# Patient Record
Sex: Female | Born: 1975 | Race: Black or African American | Hispanic: No | Marital: Single | Smoking: Never smoker
Health system: Southern US, Community
[De-identification: ages and names within clinical notes are randomized; demographics above are authoritative.]

## PROBLEM LIST (undated history)

## (undated) DIAGNOSIS — N200 Calculus of kidney: Secondary | ICD-10-CM

## (undated) DIAGNOSIS — K219 Gastro-esophageal reflux disease without esophagitis: Secondary | ICD-10-CM

## (undated) DIAGNOSIS — K589 Irritable bowel syndrome without diarrhea: Secondary | ICD-10-CM

## (undated) DIAGNOSIS — I10 Essential (primary) hypertension: Secondary | ICD-10-CM

## (undated) HISTORY — DX: Essential (primary) hypertension: I10

## (undated) HISTORY — DX: Irritable bowel syndrome, unspecified: K58.9

## (undated) HISTORY — DX: Gastro-esophageal reflux disease without esophagitis: K21.9

## (undated) HISTORY — DX: Calculus of kidney: N20.0

---

## 2005-03-13 HISTORY — PX: TUBAL LIGATION: SHX77

## 2005-03-22 ENCOUNTER — Emergency Department: Payer: Self-pay | Admitting: Emergency Medicine

## 2005-06-18 ENCOUNTER — Emergency Department: Payer: Self-pay | Admitting: Emergency Medicine

## 2005-07-11 ENCOUNTER — Ambulatory Visit: Payer: Self-pay | Admitting: Family Medicine

## 2005-09-06 ENCOUNTER — Ambulatory Visit: Payer: Self-pay | Admitting: Family Medicine

## 2005-11-09 ENCOUNTER — Observation Stay: Payer: Self-pay

## 2005-11-10 ENCOUNTER — Observation Stay: Payer: Self-pay

## 2005-11-11 ENCOUNTER — Ambulatory Visit: Payer: Self-pay

## 2005-12-22 ENCOUNTER — Observation Stay: Payer: Self-pay | Admitting: Obstetrics and Gynecology

## 2005-12-30 ENCOUNTER — Observation Stay: Payer: Self-pay

## 2006-01-06 ENCOUNTER — Observation Stay: Payer: Self-pay | Admitting: Unknown Physician Specialty

## 2006-01-07 ENCOUNTER — Inpatient Hospital Stay: Payer: Self-pay | Admitting: Unknown Physician Specialty

## 2006-09-12 ENCOUNTER — Ambulatory Visit: Payer: Self-pay | Admitting: Family Medicine

## 2006-09-24 ENCOUNTER — Ambulatory Visit: Payer: Self-pay | Admitting: Family Medicine

## 2007-11-07 ENCOUNTER — Emergency Department: Payer: Self-pay | Admitting: Internal Medicine

## 2007-12-05 ENCOUNTER — Emergency Department: Payer: Self-pay | Admitting: Emergency Medicine

## 2007-12-25 ENCOUNTER — Ambulatory Visit: Payer: Self-pay

## 2008-03-13 HISTORY — PX: CRYOABLATION: SHX1415

## 2008-11-09 ENCOUNTER — Emergency Department: Payer: Self-pay | Admitting: Emergency Medicine

## 2009-03-01 ENCOUNTER — Ambulatory Visit: Payer: Self-pay | Admitting: Obstetrics & Gynecology

## 2009-03-11 ENCOUNTER — Ambulatory Visit: Payer: Self-pay | Admitting: Obstetrics & Gynecology

## 2009-11-24 ENCOUNTER — Ambulatory Visit: Payer: Self-pay | Admitting: Neurology

## 2010-01-28 ENCOUNTER — Ambulatory Visit: Payer: Self-pay | Admitting: Internal Medicine

## 2010-03-01 IMAGING — CR RIGHT ANKLE - COMPLETE 3+ VIEW
1 series · 5 of 5 positions shown · non-contrast
Comparison: none

REASON FOR EXAM: twisted ankle
COMMENTS:

PROCEDURE:     DXR - DXR ANKLE RIGHT COMPLETE  - December 05, 2007 [DATE]
RESULT:     No fracture, dislocation or other acute bony abnormality is
identified.  The ankle mortise is well maintained.

[Series 1: view not recorded · 0.17mm/px · 5 of 5 slices shown]
[im 1/5]
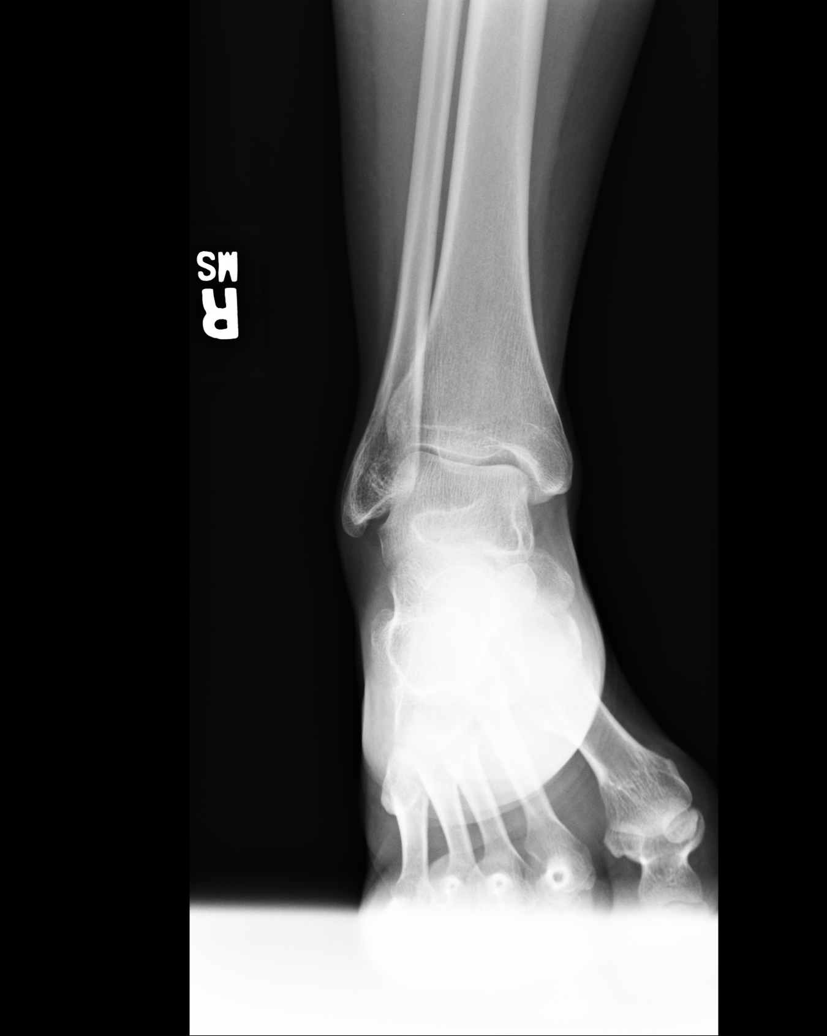
[im 2/5]
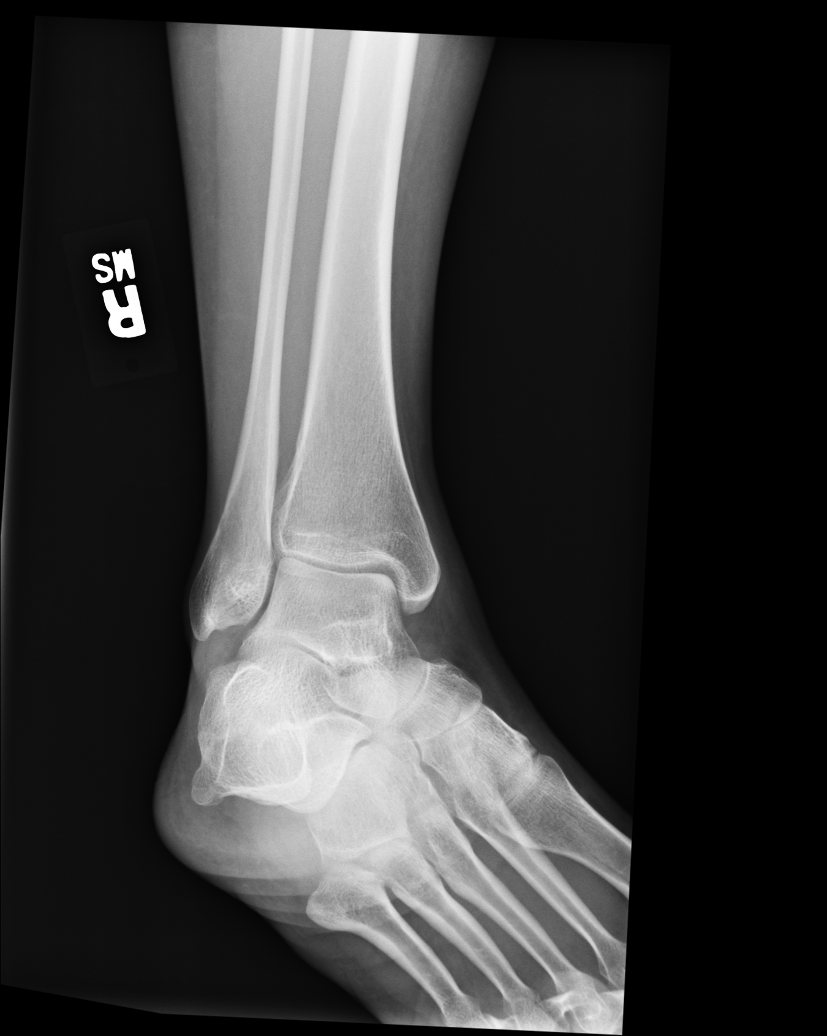
[im 3/5]
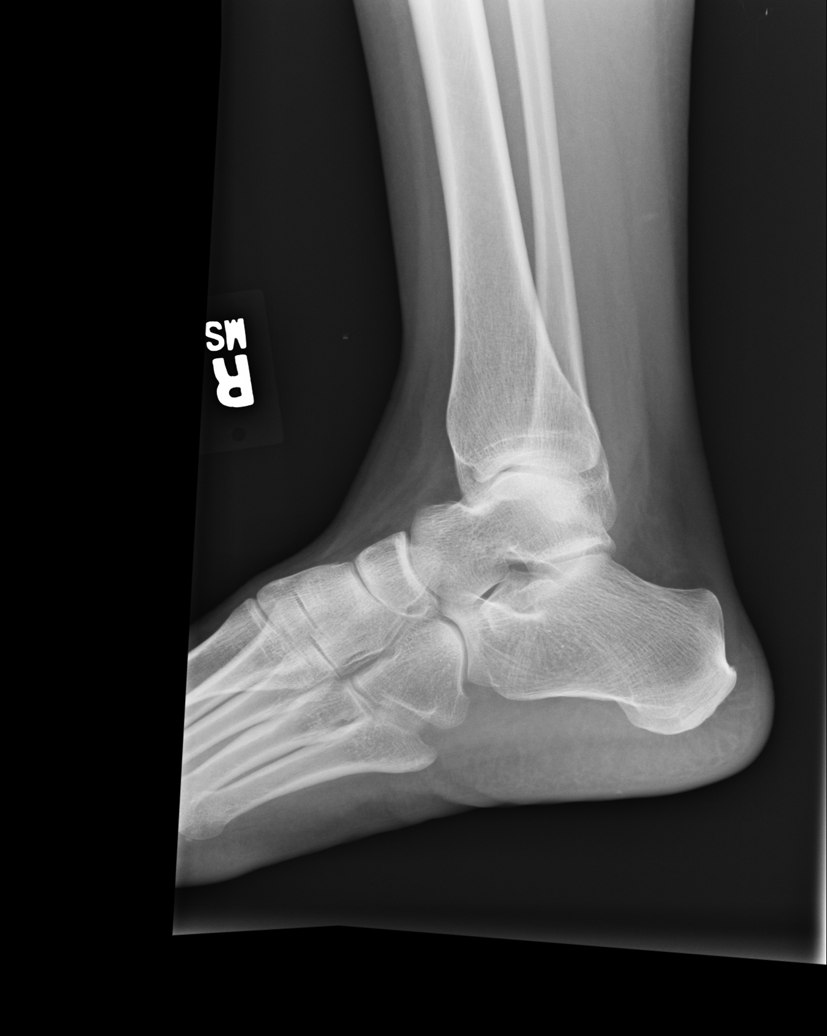
[im 4/5]
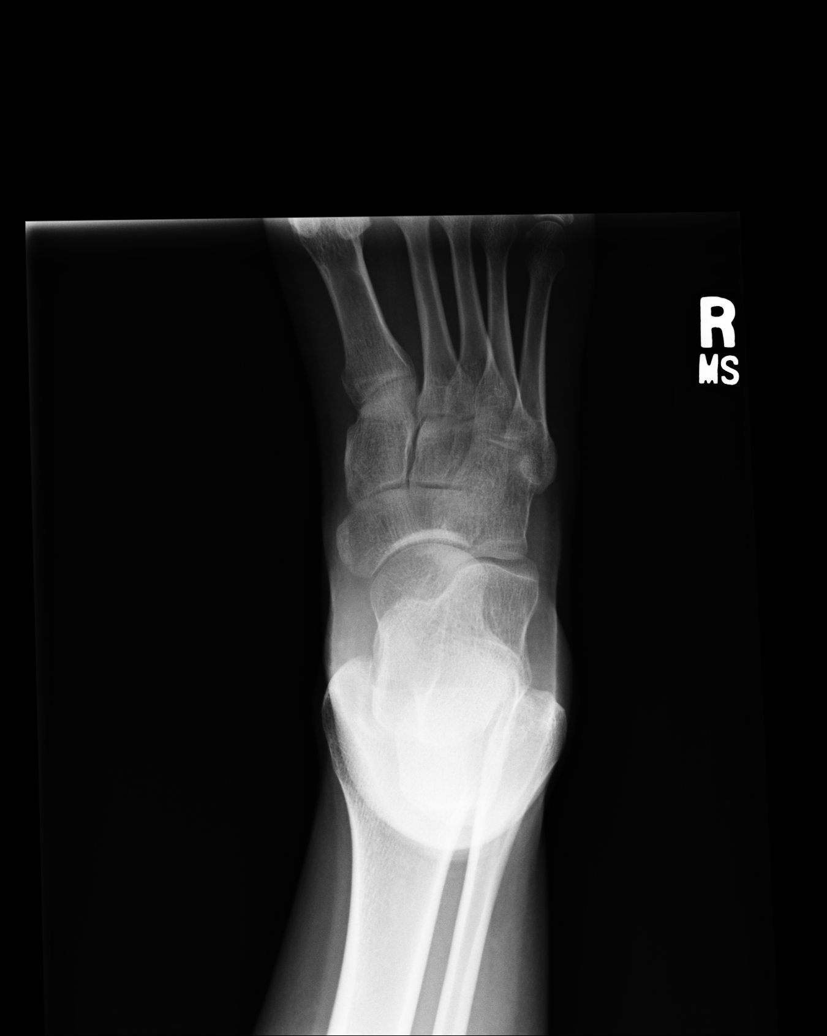
[im 5/5]
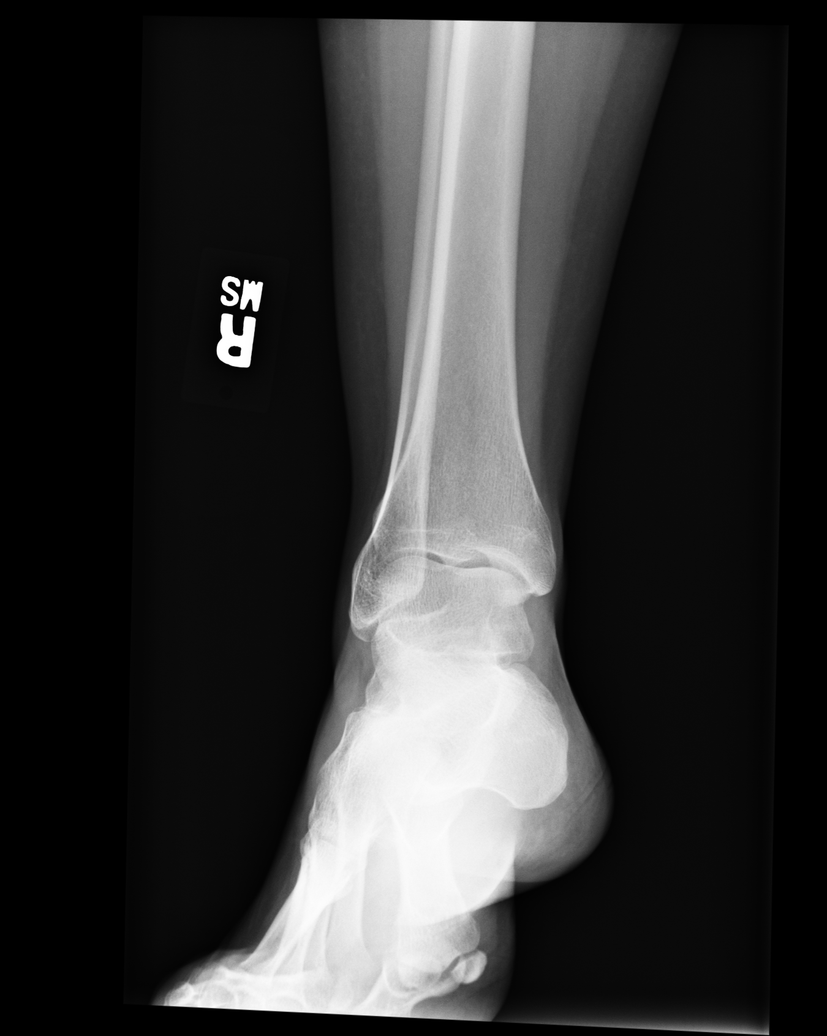

[5 of 5 positions shown; findings below may reference images not displayed]

IMPRESSION: No acute changes are identified.

## 2010-04-20 ENCOUNTER — Ambulatory Visit: Payer: Self-pay | Admitting: Internal Medicine

## 2010-05-12 ENCOUNTER — Ambulatory Visit: Payer: Self-pay | Admitting: Internal Medicine

## 2010-07-25 ENCOUNTER — Emergency Department: Payer: Self-pay | Admitting: Emergency Medicine

## 2010-08-12 ENCOUNTER — Ambulatory Visit: Payer: Self-pay | Admitting: Internal Medicine

## 2010-09-11 ENCOUNTER — Ambulatory Visit: Payer: Self-pay | Admitting: Internal Medicine

## 2011-03-07 ENCOUNTER — Emergency Department: Payer: Self-pay | Admitting: Emergency Medicine

## 2011-03-14 DIAGNOSIS — Z90710 Acquired absence of both cervix and uterus: Secondary | ICD-10-CM | POA: Insufficient documentation

## 2011-03-14 HISTORY — PX: VAGINAL HYSTERECTOMY: SUR661

## 2011-04-27 ENCOUNTER — Ambulatory Visit: Payer: Self-pay | Admitting: Internal Medicine

## 2011-07-17 ENCOUNTER — Ambulatory Visit: Payer: Self-pay | Admitting: Urology

## 2011-07-20 ENCOUNTER — Ambulatory Visit: Payer: Self-pay | Admitting: Urology

## 2011-11-12 ENCOUNTER — Emergency Department: Payer: Self-pay | Admitting: Emergency Medicine

## 2011-11-12 LAB — URINALYSIS, COMPLETE
Bilirubin,UR: NEGATIVE
Ketone: NEGATIVE
Nitrite: NEGATIVE
RBC,UR: 5 /HPF (ref 0–5)
Specific Gravity: 1.018 (ref 1.003–1.030)
Squamous Epithelial: 18
WBC UR: 8 /HPF (ref 0–5)

## 2011-11-12 LAB — PREGNANCY, URINE: Pregnancy Test, Urine: NEGATIVE m[IU]/mL

## 2011-11-13 ENCOUNTER — Emergency Department: Payer: Self-pay | Admitting: Emergency Medicine

## 2011-11-13 LAB — BASIC METABOLIC PANEL
Anion Gap: 4 — ABNORMAL LOW (ref 7–16)
Calcium, Total: 8.7 mg/dL (ref 8.5–10.1)
Chloride: 107 mmol/L (ref 98–107)
Co2: 28 mmol/L (ref 21–32)
Creatinine: 0.87 mg/dL (ref 0.60–1.30)
EGFR (African American): >60
EGFR (Non-African Amer.): >60
Glucose: 89 mg/dL (ref 65–99)
Osmolality: 275 (ref 275–301)

## 2011-11-13 LAB — CBC
HCT: 35.2 % (ref 35.0–47.0)
MCH: 24.1 pg — ABNORMAL LOW (ref 26.0–34.0)
MCHC: 32.2 g/dL (ref 32.0–36.0)
Platelet: 284 10*3/uL (ref 150–440)
RBC: 4.72 10*6/uL (ref 3.80–5.20)
RDW: 16.5 % — ABNORMAL HIGH (ref 11.5–14.5)

## 2011-11-13 LAB — URINALYSIS, COMPLETE
Bilirubin,UR: NEGATIVE
Ketone: NEGATIVE
Ph: 6 (ref 4.5–8.0)
Protein: NEGATIVE
Squamous Epithelial: 10

## 2011-12-06 ENCOUNTER — Ambulatory Visit: Payer: Self-pay | Admitting: Internal Medicine

## 2012-02-06 ENCOUNTER — Ambulatory Visit: Payer: Self-pay | Admitting: Obstetrics and Gynecology

## 2012-02-06 LAB — URINALYSIS, COMPLETE
Blood: NEGATIVE
Glucose,UR: NEGATIVE mg/dL (ref 0–75)
Nitrite: NEGATIVE
Ph: 7 (ref 4.5–8.0)
Protein: NEGATIVE
Specific Gravity: 1.02 (ref 1.003–1.030)

## 2012-02-12 ENCOUNTER — Ambulatory Visit: Payer: Self-pay | Admitting: Obstetrics and Gynecology

## 2012-02-13 LAB — PATHOLOGY REPORT

## 2012-02-13 LAB — HEMATOCRIT: HCT: 32.2 % — ABNORMAL LOW (ref 35.0–47.0)

## 2012-06-29 ENCOUNTER — Emergency Department: Payer: Self-pay | Admitting: Emergency Medicine

## 2013-02-13 ENCOUNTER — Ambulatory Visit: Payer: Self-pay

## 2014-04-17 ENCOUNTER — Ambulatory Visit: Payer: Self-pay

## 2014-04-17 LAB — COMPREHENSIVE METABOLIC PANEL
ALT: 20 U/L (ref 14–63)
ANION GAP: 6 — AB (ref 7–16)
AST: 23 U/L (ref 15–37)
Albumin: 3.4 g/dL (ref 3.4–5.0)
Alkaline Phosphatase: 55 U/L (ref 46–116)
BUN: 6 mg/dL — AB (ref 7–18)
Bilirubin,Total: 0.6 mg/dL (ref 0.2–1.0)
CALCIUM: 8.2 mg/dL — AB (ref 8.5–10.1)
CREATININE: 0.81 mg/dL (ref 0.60–1.30)
Chloride: 107 mmol/L (ref 98–107)
Co2: 27 mmol/L (ref 21–32)
Glucose: 81 mg/dL (ref 65–99)
Osmolality: 276 (ref 275–301)
Potassium: 3.9 mmol/L (ref 3.5–5.1)
Sodium: 140 mmol/L (ref 136–145)
TOTAL PROTEIN: 8 g/dL (ref 6.4–8.2)

## 2014-04-17 LAB — CBC WITH DIFFERENTIAL/PLATELET
Basophil #: 0 10*3/uL (ref 0.0–0.1)
Basophil %: 0.6 %
EOS PCT: 1 %
Eosinophil #: 0.1 10*3/uL (ref 0.0–0.7)
HCT: 36.8 % (ref 35.0–47.0)
HGB: 11.4 g/dL — ABNORMAL LOW (ref 12.0–16.0)
LYMPHS ABS: 1.5 10*3/uL (ref 1.0–3.6)
LYMPHS PCT: 27.8 %
MCH: 22.9 pg — ABNORMAL LOW (ref 26.0–34.0)
MCHC: 31 g/dL — ABNORMAL LOW (ref 32.0–36.0)
MCV: 74 fL — AB (ref 80–100)
MONOS PCT: 5.2 %
Monocyte #: 0.3 x10 3/mm (ref 0.2–0.9)
Neutrophil #: 3.6 10*3/uL (ref 1.4–6.5)
Neutrophil %: 65.4 %
PLATELETS: 292 10*3/uL (ref 150–440)
RBC: 4.99 10*6/uL (ref 3.80–5.20)
RDW: 16.6 % — AB (ref 11.5–14.5)
WBC: 5.5 10*3/uL (ref 3.6–11.0)

## 2014-04-17 LAB — LIPID PANEL
CHOLESTEROL: 198 mg/dL (ref 0–200)
HDL Cholesterol: 66 mg/dL — ABNORMAL HIGH (ref 40–60)
LDL CHOLESTEROL, CALC: 114 mg/dL — AB (ref 0–100)
TRIGLYCERIDES: 89 mg/dL (ref 0–200)
VLDL Cholesterol, Calc: 18 mg/dL (ref 5–40)

## 2014-04-17 LAB — T4, FREE: Free Thyroxine: 1.04 ng/dL (ref 0.76–1.46)

## 2014-04-17 LAB — TSH: THYROID STIMULATING HORM: 0.886 u[IU]/mL

## 2014-06-30 NOTE — Op Note (Signed)
PATIENT NAME:  Ashley Mcpherson, Ashley Mcpherson MR#:  [id number] DATE OF BIRTH:  Aug 11, 1975  DATE OF PROCEDURE:  02/12/2012  PREOPERATIVE DIAGNOSIS: Symptomatic fibroid uterus, status post thermal ablation.   POSTOPERATIVE DIAGNOSIS:  Symptomatic fibroid uterus, status post thermal ablation.   PROCEDURE: Total vaginal hysterectomy.   SURGEON: Ricky Mcpherson. Logan BoresEvans, MD  ASSISTANT: Scrub tech  ANESTHESIA: General endotracheal.   FINDINGS: Fibroid, asymmetrical, approximately 8 week uterus, grossly normal tubes and ovaries, excellent hemostasis and cosmesis.   ESTIMATED BLOOD LOSS: 75 mL.   COMPLICATIONS: None.   SPECIMENS: Uterus.   DRAINS: Foley.   PROCEDURE IN DETAIL: The patient was consented. Preoperative antibiotics were given. She was taken to the Operating Room and placed in the supine position where anesthesia was initiated and then placed in the dorsal lithotomy position using candy-cane stirrups, prepped and draped in the usual sterile fashion. The cervix was visualized and grasped with a single-tooth tenaculum and injected circumferentially with dilute vasopressin and then scalpel was used to circumscribe the cervix. With the aid of anterior deviation of the cervix and posterior weighted speculum, I was able to enter posteriorly and long-weighted was placed. The bladder was dissected out of the operative field and we proceeded with progressive bites of curved Haney-Ballantine clamp, sharp division, and 0 Vicryl suture ligation to create pedicles. We had to dissect free a right-sided uterine fibroid to access the adnexal complex. It was then accessed, clamped, divided, suture ligated, and visualized for hemostasis. We proceed in similar fashion on the left. Stumps were inspected for hemostasis and seen to be excellent. The cuff was then closed with inclusive through and through running interlocking of 0 Vicryl taking care to include the peritoneum throughout. Foley catheter was inserted at the end of the  case. Good spillage of clear urine was noted. The patient was returned to the supine position and left in the care of anesthesia. The patient tolerated the procedure well. 1 gram Ancef was given IV preoperatively. I anticipate a routine postoperative course.  ____________________________ Reatha Harpsicky Mcpherson. Logan BoresEvans, MD rle:slb D: 02/12/2012 12:04:47 ET T: 02/12/2012 12:21:11 ET JOB#: [id number]  cc: Ricky Mcpherson. Logan BoresEvans, MD, <Dictator> Augustina MoodICK Mcpherson Arrayah Connors MD ELECTRONICALLY SIGNED 02/13/2012 9:39

## 2014-11-04 ENCOUNTER — Emergency Department
Admission: EM | Admit: 2014-11-04 | Discharge: 2014-11-04 | Discharge status: Against Medical Advice | Payer: Medicaid Other | Attending: Emergency Medicine | Admitting: Emergency Medicine

## 2014-11-04 DIAGNOSIS — R109 Unspecified abdominal pain: Secondary | ICD-10-CM | POA: Diagnosis present

## 2014-11-04 LAB — BASIC METABOLIC PANEL
Anion gap: 7 (ref 5–15)
BUN: 12 mg/dL (ref 6–20)
CO2: 27 mmol/L (ref 22–32)
CREATININE: 0.84 mg/dL (ref 0.44–1.00)
Calcium: 9 mg/dL (ref 8.9–10.3)
Chloride: 105 mmol/L (ref 101–111)
GFR calc Af Amer: >60 mL/min (ref >60)
Glucose, Bld: 95 mg/dL (ref 65–99)
Potassium: 3.2 mmol/L — ABNORMAL LOW (ref 3.5–5.1)
SODIUM: 139 mmol/L (ref 135–145)

## 2014-11-04 LAB — CBC
HEMATOCRIT: 33.6 % — AB (ref 35.0–47.0)
Hemoglobin: 10.5 g/dL — ABNORMAL LOW (ref 12.0–16.0)
MCH: 23 pg — ABNORMAL LOW (ref 26.0–34.0)
MCHC: 31.3 g/dL — ABNORMAL LOW (ref 32.0–36.0)
MCV: 73.2 fL — AB (ref 80.0–100.0)
PLATELETS: 266 10*3/uL (ref 150–440)
RBC: 4.59 MIL/uL (ref 3.80–5.20)
RDW: 15.8 % — AB (ref 11.5–14.5)
WBC: 6 10*3/uL (ref 3.6–11.0)

## 2014-11-04 LAB — LIPASE, BLOOD: Lipase: 18 U/L — ABNORMAL LOW (ref 22–51)

## 2014-12-15 ENCOUNTER — Encounter: Payer: Self-pay | Admitting: *Deleted

## 2014-12-15 ENCOUNTER — Emergency Department
Admission: EM | Admit: 2014-12-15 | Discharge: 2014-12-15 | Disposition: A | Discharge status: Home / Self Care | Payer: Medicaid Other | Attending: Emergency Medicine | Admitting: Emergency Medicine

## 2014-12-15 DIAGNOSIS — F410 Panic disorder [episodic paroxysmal anxiety] without agoraphobia: Secondary | ICD-10-CM | POA: Diagnosis not present

## 2014-12-15 DIAGNOSIS — R000 Tachycardia, unspecified: Secondary | ICD-10-CM | POA: Diagnosis not present

## 2014-12-15 MED ORDER — LORAZEPAM 1 MG PO TABS
ORAL_TABLET | ORAL | Status: AC
  Administered 2014-12-15: 1 mg via ORAL
  Filled 2014-12-15: qty 1

## 2014-12-15 MED ORDER — LORAZEPAM 1 MG PO TABS
1.0000 mg | ORAL_TABLET | Freq: Once | ORAL | Status: AC
  Administered 2014-12-15: 1 mg via ORAL

## 2014-12-15 NOTE — ED Notes (Signed)
Per EMS pt from home with c/o panic attack. Caused by family argument. Denies pain. Pt breathing 60-70 times per minute per EMS. Hx of same. States has not taken meds for anxiety today.

## 2014-12-15 NOTE — ED Provider Notes (Signed)
[facility] Emergency Department Provider Note  ____________________________________________  Time seen: On arrival, via EMS  I have reviewed the triage vital signs and the nursing notes.   HISTORY  Chief Complaint Panic Attack    HPI Ashley Mcpherson is a 39 y.o. female who presents with a panic attack. Per EMS this was caused by family argument. Patient recently had a baby and father wants to takethe baby away from her. Mother reports a history of panic attacks. She is breathing rapidly and is very anxious. Denies chest pain to me. This panic attack began approximately 30 minutes ago and is similar to past events      History reviewed. No pertinent past medical history.  There are no active problems to display for this patient.   Past Surgical History  Procedure Laterality Date  . Abdominal hysterectomy      No current outpatient prescriptions on file.  Allergies Review of patient's allergies indicates no known allergies.  No family history on file.  Social History Social History  Substance Use Topics  . Smoking status: Never Smoker   . Smokeless tobacco: None  . Alcohol Use: No    Review of Systems Limited by patient's anxiety  Constitutional: Negative for fever. Eyes: Negative for visual changes. ENT: Negative for sore throat Cardiovascular: Negative for chest pain. Respiratory: Negative for shortness of breath. Gastrointestinal: Negative for abdominal pain, vomiting and diarrhea. Genitourinary: Negative for dysuria. Musculoskeletal: Negative for back pain. Skin: Negative for rash. Neurological: Negative for headaches or focal weakness Psychiatric: Significant anxiety    ____________________________________________   PHYSICAL EXAM:  VITAL SIGNS: ED Triage Vitals  Enc Vitals Group     BP 12/15/14 1421 155/87 mmHg     Pulse Rate 12/15/14 1421 113     Resp 12/15/14 1421 28     Temp 12/15/14 1421 98.3 F (36.8 C)   Temp Source 12/15/14 1421 Oral     SpO2 12/15/14 1421 100 %     Weight 12/15/14 1421 160 lb (72.576 kg)     Height 12/15/14 1421  (1.626 m)     Head Cir --      Peak Flow --      Pain Score --      Pain Loc --      Pain Edu? --      Excl. in GC? --      Constitutional: Alert and oriented. Anxious and tearful Eyes: Conjunctivae are normal.  ENT   Head: Normocephalic and atraumatic.   Mouth/Throat: Mucous membranes are moist. Cardiovascular: Tachycardic regular rhythm. Normal and symmetric distal pulses are present in all extremities. No murmurs, rubs, or gallops. Respiratory: Tachypnea. Breath sounds are clear and equal bilaterally.  Gastrointestinal: Soft and non-tender in all quadrants. No distention. There is no CVA tenderness. Genitourinary: deferred Musculoskeletal: Nontender with normal range of motion in all extremities. No lower extremity tenderness nor edema. Neurologic:  Normal speech and language. No gross focal neurologic deficits are appreciated. Skin:  Skin is warm, dry and intact. No rash noted. Psychiatric: Significant anxiety, tearful and hyperventilating  ____________________________________________    LABS (pertinent positives/negatives)  Labs Reviewed - No data to display  ____________________________________________   EKG  None  ____________________________________________    RADIOLOGY I have personally reviewed any xrays that were ordered on this patient: None  ____________________________________________   PROCEDURES  Procedure(s) performed: none  Critical Care performed: none  ____________________________________________   INITIAL IMPRESSION / ASSESSMENT AND PLAN / ED COURSE  Pertinent labs & imaging results that were available during my care of the patient were reviewed by me and considered in my medical decision making (see chart for details).  Patient with presentation consistent with a panic attack. We have given  her 1 mg of Ativan by mouth and encouraged deep slow breathing. This appears to be helping her significantly. We will give her time to rest and we will revaluate  ----------------------------------------- 3:20 PM on 12/15/2014 -----------------------------------------  Patient called and feeling much better. She reports this was very similar to past panic attacks and she feels like she is able to go home. She has no depression or SI  ____________________________________________   FINAL CLINICAL IMPRESSION(S) / ED DIAGNOSES  Final diagnoses:  Panic attack     Jene Every, MD 12/15/14 1520

## 2014-12-15 NOTE — Discharge Instructions (Signed)

## 2015-06-03 ENCOUNTER — Other Ambulatory Visit: Payer: Self-pay | Admitting: Physician Assistant

## 2015-06-03 DIAGNOSIS — R109 Unspecified abdominal pain: Secondary | ICD-10-CM

## 2015-06-10 ENCOUNTER — Ambulatory Visit
Admission: RE | Admit: 2015-06-10 | Discharge: 2015-06-10 | Disposition: A | Discharge status: Home / Self Care | Payer: Medicaid Other | Source: Ambulatory Visit | Attending: Physician Assistant | Admitting: Physician Assistant

## 2015-06-10 DIAGNOSIS — R109 Unspecified abdominal pain: Secondary | ICD-10-CM

## 2015-06-10 DIAGNOSIS — N200 Calculus of kidney: Secondary | ICD-10-CM | POA: Insufficient documentation

## 2015-07-08 ENCOUNTER — Ambulatory Visit (INDEPENDENT_AMBULATORY_CARE_PROVIDER_SITE_OTHER): Payer: Medicaid Other | Admitting: Urology

## 2015-07-08 ENCOUNTER — Encounter: Payer: Self-pay | Admitting: Urology

## 2015-07-08 VITALS — BP 169/92 | HR 82 | Ht 64.0 in | Wt 163.8 lb

## 2015-07-08 DIAGNOSIS — R3129 Other microscopic hematuria: Secondary | ICD-10-CM

## 2015-07-08 DIAGNOSIS — M545 Low back pain: Secondary | ICD-10-CM

## 2015-07-08 DIAGNOSIS — N200 Calculus of kidney: Secondary | ICD-10-CM

## 2015-07-08 LAB — URINALYSIS, COMPLETE
Bilirubin, UA: NEGATIVE
Glucose, UA: NEGATIVE
Ketones, UA: NEGATIVE
LEUKOCYTES UA: NEGATIVE
Nitrite, UA: NEGATIVE
PH UA: 5 (ref 5.0–7.5)
PROTEIN UA: NEGATIVE
Specific Gravity, UA: 1.02 (ref 1.005–1.030)
Urobilinogen, Ur: 0.2 mg/dL (ref 0.2–1.0)

## 2015-07-08 LAB — MICROSCOPIC EXAMINATION: Epithelial Cells (non renal): >10 /hpf — AB (ref 0–10)

## 2015-07-08 NOTE — Progress Notes (Signed)
07/08/2015 9:54 AM   Ashley DiceNintisha L Mcpherson 08/20/1975 [id number]  [facility] [address] [place], [place] [id number]  Chief Complaint  Patient presents with  . New Patient (Initial Visit)    Renal Stone     HPI: The patient is a 40 year old female presents for evaluation for her bilateral punctate renal calculi.  This was seen on a recent CT stone protocol. The stone burden is similar to her previous bilateral punctate stone burden on a CT scan 2013 on my review. The patient states that she's had bilateral lower back pain for 3 years now. Nothing makes it better or worse. She is concerned that its the stones is causing this pain. She is been told she has had stones on previous CT scans, she has never passed a stone was seen urologist before. She was recently treated for urinary tract infection when blood was seen on microscopic examination of her urine. The patient has no personal history of smoking.   PMH: Past Medical History  Diagnosis Date  . Hypertension   . IBS (irritable bowel syndrome)     Surgical History: Past Surgical History  Procedure Laterality Date  . Abdominal hysterectomy      Home Medications:    Medication List       This list is accurate as of: 07/08/15  9:54 AM.  Always use your most recent med list.               tamsulosin 0.4 MG Caps capsule  Commonly known as:  FLOMAX  Take 0.4 mg by mouth.     traMADol 50 MG tablet  Commonly known as:  ULTRAM  Take by mouth every 6 (six) hours as needed.        Allergies: No Known Allergies  Family History: Family History  Problem Relation Age of Onset  . Bladder Cancer Neg Hx   . Kidney cancer Neg Hx     Social History:  reports that she has never smoked. She does not have any smokeless tobacco history on file. She reports that she does not drink alcohol or use illicit drugs.  ROS: UROLOGY Frequent Urination?: No Hard to postpone urination?: No Burning/pain  with urination?: No Get up at night to urinate?: Yes Leakage of urine?: No Urine stream starts and stops?: No Trouble starting stream?: No Do you have to strain to urinate?: No Blood in urine?: No Urinary tract infection?: Yes Sexually transmitted disease?: No Injury to kidneys or bladder?: No Painful intercourse?: No Weak stream?: No Currently pregnant?: No Vaginal bleeding?: No Last menstrual period?: hysterectomy  Gastrointestinal Nausea?: No Vomiting?: No Indigestion/heartburn?: No Diarrhea?: Yes Constipation?: Yes  Constitutional Fever: No Night sweats?: No Weight loss?: No Fatigue?: No  Skin Skin rash/lesions?: No Itching?: No  Eyes Blurred vision?: No Double vision?: No  Ears/Nose/Throat Sore throat?: No Sinus problems?: No  Hematologic/Lymphatic Swollen glands?: No Easy bruising?: No  Cardiovascular Leg swelling?: No Chest pain?: No  Respiratory Cough?: No Shortness of breath?: No  Endocrine Excessive thirst?: No  Musculoskeletal Back pain?: No Joint pain?: No  Neurological Headaches?: No Dizziness?: No  Psychologic Depression?: No Anxiety?: No  Physical Exam: BP 169/92 mmHg  Pulse 82  Ht 5\' 4"  (1.626 m)  Wt 163 lb 12.8 oz (74.299 kg)  BMI 28.10 kg/m2  Constitutional:  Alert and oriented, No acute distress. HEENT: [place] AT, moist mucus membranes.  Trachea midline, no masses. Cardiovascular: No clubbing, cyanosis, or edema. Respiratory: Normal respiratory effort,  no increased work of breathing. GI: Abdomen is soft, nontender, nondistended, no abdominal masses GU: No CVA tenderness. Mild midline lower back tenderness to palpation but no CVA tenderness. Skin: No rashes, bruises or suspicious lesions. Lymph: No cervical or inguinal adenopathy. Neurologic: Grossly intact, no focal deficits, moving all 4 extremities. Psychiatric: Normal mood and affect.  Laboratory Data: Lab Results  Component Value Date   WBC 6.0 11/04/2014    HGB 10.5* 11/04/2014   HCT 33.6* 11/04/2014   MCV 73.2* 11/04/2014   PLT 266 11/04/2014    Lab Results  Component Value Date   CREATININE 0.84 11/04/2014    No results found for: PSA  No results found for: TESTOSTERONE  No results found for: HGBA1C  Urinalysis    Component Value Date/Time   COLORURINE Yellow 02/06/2012 1506   APPEARANCEUR Cloudy 02/06/2012 1506   LABSPEC 1.020 02/06/2012 1506   PHURINE 7.0 02/06/2012 1506   GLUCOSEU Negative 02/06/2012 1506   HGBUR Negative 02/06/2012 1506   BILIRUBINUR Negative 02/06/2012 1506   KETONESUR Negative 02/06/2012 1506   PROTEINUR Negative 02/06/2012 1506   NITRITE Negative 02/06/2012 1506   LEUKOCYTESUR 3+ 02/06/2012 1506    Pertinent Imaging: CLINICAL DATA: 40 year old female with left flank pain for 1 week. Microscopic hematuria. Personal history of nephrolithiasis. Initial encounter.  EXAM: CT ABDOMEN AND PELVIS WITHOUT CONTRAST  TECHNIQUE: Multidetector CT imaging of the abdomen and pelvis was performed following the standard protocol without IV contrast.  COMPARISON: CT Abdomen and Pelvis 11/13/2011.  FINDINGS: Normal lung bases. No pericardial or pleural effusion.  No osseous abnormality identified.  Retained stool in the rectum. Normal for age noncontrast appearance of the uterus and adnexa; evidence of a physiologic cyst on the right.  Redundant sigmoid colon mildly distended with gas in the region of the umbilicus. The sigmoid then extends to the splenic flexure. The left colon is redundant with retained stool throughout. The distal transverse colon is redundant. The ascending colon is redundant. The cecum and appendix are normal; small volume of hyperdense enteric material in the cecum. No dilated or abnormal small bowel loops identified. Decompressed stomach and duodenum.  No abdominal free air or free fluid. Noncontrast liver, gallbladder, spleen, pancreas and adrenal glands are  within normal limits. No lymphadenopathy identified.  Extensive punctate renal medullary calculi bilaterally. No left perinephric stranding or hydronephrosis. No left hydroureter. No calculus along the course of the left ureter. Diminutive urinary bladder appears normal.  No right perinephric stranding or hydronephrosis. No calculus along the course of the right ureter. New small posterior right hemi pelvic phlebolith on series 2, image 63.  IMPRESSION: 1. Numerous bilateral punctate renal calculi but no obstructive uropathy. 2. Redundant large bowel with retained stool. 3. Otherwise negative noncontrast CT Abdomen and Pelvis.   Assessment & Plan:    1. Bilateral nonobstructing renal punctate calculi 2. Lower back pain 3. Microscopic hematuria The patient has bilateral nonobstructing punctate calculi that are stable in size since 2017. As the stones are small, nonobstructing and her symptoms do not fit that of obstructive uropathy, I do not feel that the stones are the source of her pain. At this point, the stones are too small to necessitate intervention. She does have 3-10 red blood cells per high-power field in her urine today. She has no risk factors for GU malignancy. This is likely from her bilateral nonobstructing stones, so I do not think she needs a complete hematuria workup at this time. We will have her come back  in 1 year with a KUB to ensure the stones are stable in size. I suspect her lower back pain is musculoskeletal in origin.  Return in about 1 year (around 07/07/2016) for with KUB just prior to appointment.  Hildred Laser, MD  [facility] [address] [place], [place] [address] [phone]

## 2015-07-29 ENCOUNTER — Encounter: Payer: Self-pay | Admitting: Gastroenterology

## 2015-07-29 ENCOUNTER — Ambulatory Visit (INDEPENDENT_AMBULATORY_CARE_PROVIDER_SITE_OTHER): Payer: Medicaid Other | Admitting: Gastroenterology

## 2015-07-29 VITALS — BP 132/84 | HR 72 | Temp 98.4°F | Ht 64.0 in | Wt 167.0 lb

## 2015-07-29 DIAGNOSIS — K5901 Slow transit constipation: Secondary | ICD-10-CM

## 2015-07-29 DIAGNOSIS — K581 Irritable bowel syndrome with constipation: Secondary | ICD-10-CM | POA: Diagnosis not present

## 2015-07-29 DIAGNOSIS — K5909 Other constipation: Secondary | ICD-10-CM

## 2015-07-29 NOTE — Progress Notes (Signed)
Gastroenterology Consultation  Referring Provider:     [facility] Primary Care Physician:  [facility] Primary Gastroenterologist:  Dr. Servando Snare     Reason for Consultation:     Constipation        HPI:   Ashley Mcpherson is a 40 y.o. y/o female referred for consultation & management of Constipation by Dr. Sander [facility].  This patient comes today with a long-standing history of constipation. The patient had imaging of abdomen that showed a large stool burden and redundant colon. Patient reports that she has had abdominal bloating and constipation for many years. The patient had been in the [employer] with some heartburn and dyspepsia and was investigated with an upper GI series and a upper endoscopy. The patient denies any upper GI symptoms at the present time. The patient states that she was started on Linzess 145 g for her constipation and it worked for some time but then stopped working. The patient now states that she has been on the 290 g with good results. She now reports that she takes 1 pill a day and has a bowel movement every day. There is less bloating no abdominal discomfort no rectal bleeding no family history of colon cancer colon polyps and no unexplained weight loss.  Past Medical History  Diagnosis Date  . Hypertension   . IBS (irritable bowel syndrome)     Past Surgical History  Procedure Laterality Date  . Abdominal hysterectomy      Prior to Admission medications   Medication Sig Start Date End Date Taking? Authorizing Provider  linaclotide (LINZESS) 290 MCG CAPS capsule Take 290 mcg by mouth daily before breakfast.   Yes Historical Provider, MD  traMADol (ULTRAM) 50 MG tablet Take by mouth every 6 (six) hours as needed.   Yes Historical Provider, MD  tamsulosin (FLOMAX) 0.4 MG CAPS capsule Take 0.4 mg by mouth. Reported on 07/29/2015    Historical Provider, MD    Family History  Problem Relation Age of Onset  . Bladder Cancer  Neg Hx   . Kidney cancer Neg Hx   . Diabetes Mother   . Hypertension Mother   . Diabetes Father   . Hypertension Father      Social History  Substance Use Topics  . Smoking status: Never Smoker   . Smokeless tobacco: Never Used  . Alcohol Use: No    Allergies as of 07/29/2015  . (No Known Allergies)    Review of Systems:    All systems reviewed and negative except where noted in HPI.   Physical Exam:  BP 132/84 mmHg  Pulse 72  Temp(Src) 98.4 F (36.9 C) (Oral)  Ht  (1.626 m)  Wt 167 lb (75.751 kg)  BMI 28.65 kg/m2 No LMP recorded. Patient has had a hysterectomy. Psych:  Alert and cooperative. Normal mood and affect. General:   Alert,  Well-developed, well-nourished, pleasant and cooperative in NAD Head:  Normocephalic and atraumatic. Eyes:  Sclera clear, no icterus.   Conjunctiva pink. Ears:  Normal auditory acuity. Nose:  No deformity, discharge, or lesions. Mouth:  No deformity or lesions,oropharynx pink & moist. Neck:  Supple; no masses or thyromegaly. Lungs:  Respirations even and unlabored.  Clear throughout to auscultation.   No wheezes, crackles, or rhonchi. No acute distress. Heart:  Regular rate and rhythm; no murmurs, clicks, rubs, or gallops. Abdomen:  Normal bowel sounds.  No bruits.  Soft, non-tender and non-distended without masses, hepatosplenomegaly  or hernias noted.  No guarding or rebound tenderness.  Negative Carnett sign.   Rectal:  Deferred.  Msk:  Symmetrical without gross deformities.  Good, equal movement & strength bilaterally. Pulses:  Normal pulses noted. Extremities:  No clubbing or edema.  No cyanosis. Neurologic:  Alert and oriented x3;  grossly normal neurologically. Skin:  Intact without significant lesions or rashes.  No jaundice. Lymph Nodes:  No significant cervical adenopathy. Psych:  Alert and cooperative. Normal mood and affect.  Imaging Studies: No results found.  Assessment and Plan:   Ashley Mcpherson is a 40 y.o.  y/o female who comes in today with a history of IBS-C. The patient is well controlled on Linzess 190 g every day. The patient has no worrisome symptoms. The patient had imaging that showed a large stool burden with gaseous distention. The patient is no longer having this now that she has been on the medication. The patient has been told to continue her present medication and contact me if any of her symptoms get worse or if she starts to have any worrisome symptoms. The patient has been explained the plan and agrees with it.   Note: This dictation was prepared with Dragon dictation along with smaller phrase technology. Any transcriptional errors that result from this process are unintentional.

## 2015-09-24 ENCOUNTER — Other Ambulatory Visit
Admission: RE | Admit: 2015-09-24 | Discharge: 2015-09-24 | Disposition: A | Discharge status: Home / Self Care | Payer: Medicaid Other | Source: Ambulatory Visit | Attending: Nurse Practitioner | Admitting: Nurse Practitioner

## 2015-09-24 DIAGNOSIS — R5383 Other fatigue: Secondary | ICD-10-CM | POA: Insufficient documentation

## 2015-09-24 DIAGNOSIS — E559 Vitamin D deficiency, unspecified: Secondary | ICD-10-CM | POA: Diagnosis not present

## 2015-09-24 DIAGNOSIS — I10 Essential (primary) hypertension: Secondary | ICD-10-CM | POA: Diagnosis present

## 2015-09-24 LAB — TSH: TSH: 0.802 u[IU]/mL (ref 0.350–4.500)

## 2015-09-24 LAB — T4, FREE: Free T4: 0.93 ng/dL (ref 0.61–1.12)

## 2015-09-24 LAB — LIPID PANEL
Cholesterol: 227 mg/dL — ABNORMAL HIGH (ref 0–200)
HDL: 54 mg/dL (ref >40)
LDL CALC: 151 mg/dL — AB (ref 0–99)
TRIGLYCERIDES: 108 mg/dL (ref <150)
Total CHOL/HDL Ratio: 4.2 RATIO
VLDL: 22 mg/dL (ref 0–40)

## 2015-09-24 LAB — COMPREHENSIVE METABOLIC PANEL
ALBUMIN: 3.9 g/dL (ref 3.5–5.0)
ALT: 16 U/L (ref 14–54)
ANION GAP: 6 (ref 5–15)
AST: 19 U/L (ref 15–41)
Alkaline Phosphatase: 44 U/L (ref 38–126)
BUN: 10 mg/dL (ref 6–20)
CALCIUM: 8.7 mg/dL — AB (ref 8.9–10.3)
CHLORIDE: 105 mmol/L (ref 101–111)
CO2: 26 mmol/L (ref 22–32)
Creatinine, Ser: 0.77 mg/dL (ref 0.44–1.00)
GFR calc non Af Amer: >60 mL/min (ref >60)
Glucose, Bld: 88 mg/dL (ref 65–99)
Potassium: 4 mmol/L (ref 3.5–5.1)
SODIUM: 137 mmol/L (ref 135–145)
Total Bilirubin: 1 mg/dL (ref 0.3–1.2)
Total Protein: 7.6 g/dL (ref 6.5–8.1)

## 2015-09-24 LAB — CBC
HCT: 35 % (ref 35.0–47.0)
Hemoglobin: 11.3 g/dL — ABNORMAL LOW (ref 12.0–16.0)
MCH: 23.6 pg — AB (ref 26.0–34.0)
MCHC: 32.2 g/dL (ref 32.0–36.0)
MCV: 73.3 fL — ABNORMAL LOW (ref 80.0–100.0)
PLATELETS: 258 10*3/uL (ref 150–440)
RBC: 4.77 MIL/uL (ref 3.80–5.20)
RDW: 15.8 % — ABNORMAL HIGH (ref 11.5–14.5)
WBC: 6.3 10*3/uL (ref 3.6–11.0)

## 2015-09-25 LAB — VITAMIN D 25 HYDROXY (VIT D DEFICIENCY, FRACTURES): Vit D, 25-Hydroxy: 21.6 ng/mL — ABNORMAL LOW (ref 30.0–100.0)

## 2015-12-03 ENCOUNTER — Encounter: Payer: Self-pay | Admitting: Emergency Medicine

## 2015-12-03 ENCOUNTER — Emergency Department: Payer: Medicaid Other

## 2015-12-03 ENCOUNTER — Emergency Department
Admission: EM | Admit: 2015-12-03 | Discharge: 2015-12-03 | Disposition: A | Discharge status: Home / Self Care | Payer: Medicaid Other | Attending: Student in an Organized Health Care Education/Training Program | Admitting: Student in an Organized Health Care Education/Training Program

## 2015-12-03 DIAGNOSIS — M545 Low back pain: Secondary | ICD-10-CM | POA: Diagnosis present

## 2015-12-03 DIAGNOSIS — R110 Nausea: Secondary | ICD-10-CM | POA: Diagnosis not present

## 2015-12-03 DIAGNOSIS — R109 Unspecified abdominal pain: Secondary | ICD-10-CM | POA: Diagnosis not present

## 2015-12-03 DIAGNOSIS — Z791 Long term (current) use of non-steroidal anti-inflammatories (NSAID): Secondary | ICD-10-CM | POA: Diagnosis not present

## 2015-12-03 DIAGNOSIS — I10 Essential (primary) hypertension: Secondary | ICD-10-CM | POA: Diagnosis not present

## 2015-12-03 LAB — CBC
HCT: 35.9 % (ref 35.0–47.0)
Hemoglobin: 11.5 g/dL — ABNORMAL LOW (ref 12.0–16.0)
MCH: 23.3 pg — ABNORMAL LOW (ref 26.0–34.0)
MCHC: 32 g/dL (ref 32.0–36.0)
MCV: 72.9 fL — AB (ref 80.0–100.0)
PLATELETS: 276 10*3/uL (ref 150–440)
RBC: 4.92 MIL/uL (ref 3.80–5.20)
RDW: 16 % — AB (ref 11.5–14.5)
WBC: 7.1 10*3/uL (ref 3.6–11.0)

## 2015-12-03 LAB — COMPREHENSIVE METABOLIC PANEL
ALT: 14 U/L (ref 14–54)
AST: 19 U/L (ref 15–41)
Albumin: 3.9 g/dL (ref 3.5–5.0)
Alkaline Phosphatase: 49 U/L (ref 38–126)
Anion gap: 5 (ref 5–15)
BILIRUBIN TOTAL: 0.9 mg/dL (ref 0.3–1.2)
BUN: 10 mg/dL (ref 6–20)
CHLORIDE: 105 mmol/L (ref 101–111)
CO2: 27 mmol/L (ref 22–32)
CREATININE: 0.87 mg/dL (ref 0.44–1.00)
Calcium: 8.8 mg/dL — ABNORMAL LOW (ref 8.9–10.3)
Glucose, Bld: 91 mg/dL (ref 65–99)
Potassium: 3.8 mmol/L (ref 3.5–5.1)
Sodium: 137 mmol/L (ref 135–145)
TOTAL PROTEIN: 7.9 g/dL (ref 6.5–8.1)

## 2015-12-03 LAB — URINALYSIS COMPLETE WITH MICROSCOPIC (ARMC ONLY)
BILIRUBIN URINE: NEGATIVE
GLUCOSE, UA: NEGATIVE mg/dL
HGB URINE DIPSTICK: NEGATIVE
KETONES UR: NEGATIVE mg/dL
LEUKOCYTES UA: NEGATIVE
NITRITE: NEGATIVE
PH: 8 (ref 5.0–8.0)
Protein, ur: NEGATIVE mg/dL
SPECIFIC GRAVITY, URINE: 1.012 (ref 1.005–1.030)

## 2015-12-03 MED ORDER — PROMETHAZINE HCL 25 MG PO TABS
12.5000 mg | ORAL_TABLET | Freq: Once | ORAL | Status: AC
  Administered 2015-12-03: 12.5 mg via ORAL
  Filled 2015-12-03: qty 1

## 2015-12-03 MED ORDER — DIAZEPAM 5 MG PO TABS
ORAL_TABLET | ORAL | Status: AC
  Filled 2015-12-03: qty 1

## 2015-12-03 MED ORDER — CYCLOBENZAPRINE HCL 10 MG PO TABS: 10.0000 mg | ORAL_TABLET | Freq: Three times a day (TID) | ORAL | 0 refills | Status: DC | PRN

## 2015-12-03 MED ORDER — POLYETHYLENE GLYCOL 3350 17 GM/SCOOP PO POWD: 1.0000 | Freq: Once | ORAL | 0 refills | Status: AC

## 2015-12-03 MED ORDER — OXYCODONE-ACETAMINOPHEN 5-325 MG PO TABS
2.0000 | ORAL_TABLET | Freq: Once | ORAL | Status: AC
Start: 2015-12-03 — End: 2015-12-03
  Administered 2015-12-03: 2 via ORAL
  Filled 2015-12-03: qty 2

## 2015-12-03 MED ORDER — SODIUM CHLORIDE 0.9 % IV BOLUS (SEPSIS): 1000.0000 mL | Freq: Once | INTRAVENOUS | Status: DC

## 2015-12-03 MED ORDER — PROMETHAZINE HCL 12.5 MG PO TABS: 12.5000 mg | ORAL_TABLET | Freq: Four times a day (QID) | ORAL | 0 refills | Status: DC | PRN

## 2015-12-03 MED ORDER — NAPROXEN 500 MG PO TABS
500.0000 mg | ORAL_TABLET | Freq: Two times a day (BID) | ORAL | 0 refills | Status: AC
Start: 2015-12-03 — End: 2016-12-02

## 2015-12-03 MED ORDER — PROMETHAZINE HCL 25 MG/ML IJ SOLN: 12.5000 mg | Freq: Once | INTRAMUSCULAR | Status: DC

## 2015-12-03 MED ORDER — DIAZEPAM 5 MG PO TABS
10.0000 mg | ORAL_TABLET | Freq: Once | ORAL | Status: AC
  Administered 2015-12-03: 10 mg via ORAL
  Filled 2015-12-03: qty 1

## 2015-12-03 MED ORDER — KETOROLAC TROMETHAMINE 30 MG/ML IJ SOLN: 30.0000 mg | Freq: Once | INTRAMUSCULAR | Status: DC

## 2015-12-03 MED ORDER — NAPROXEN 500 MG PO TABS
500.0000 mg | ORAL_TABLET | Freq: Once | ORAL | Status: AC
  Administered 2015-12-03: 500 mg via ORAL
  Filled 2015-12-03: qty 1

## 2015-12-03 MED ORDER — ONDANSETRON HCL 4 MG/2ML IJ SOLN: 4.0000 mg | Freq: Once | INTRAMUSCULAR | Status: DC

## 2015-12-03 NOTE — ED Provider Notes (Signed)
[facility] Emergency Department Provider Note    First MD Initiated Contact with Patient 12/03/15 1051     (approximate)  I have reviewed the triage vital signs and the nursing notes.   HISTORY  Chief Complaint Flank Pain    HPI TOY Ashley Mcpherson is a 40 y.o. female history of bilateral nonobstructing nephrolithiasis presents with recurrent bilateral low back pain. States that this feels like her previous bouts of kidney stones. Had a CT scan in follow-up urology. Has T CT scan showing no significant worsening of stone burden. She denies any fevers. Denies any hematuria or dysuria. She did feel nauseated this morning but no vomiting. Denies any chest pain or shortness of breath. States the pain is dull and achy in nature. 7 out of 10 in severity at worse. Episodes lasts 4-5 minutes. Worsened with movement. Denies any recent trauma or heavy lifting.   Past Medical History:  Diagnosis Date  . Hypertension   . IBS (irritable bowel syndrome)     There are no active problems to display for this patient.   Past Surgical History:  Procedure Laterality Date  . ABDOMINAL HYSTERECTOMY      Prior to Admission medications   Medication Sig Start Date End Date Taking? Authorizing Provider  linaclotide (LINZESS) 290 MCG CAPS capsule Take 290 mcg by mouth daily before breakfast.    Historical Provider, MD  tamsulosin (FLOMAX) 0.4 MG CAPS capsule Take 0.4 mg by mouth. Reported on 07/29/2015    Historical Provider, MD  traMADol (ULTRAM) 50 MG tablet Take by mouth every 6 (six) hours as needed.    Historical Provider, MD    Allergies Review of patient's allergies indicates no known allergies.  Family History  Problem Relation Age of Onset  . Diabetes Mother   . Hypertension Mother   . Diabetes Father   . Hypertension Father   . Bladder Cancer Neg Hx   . Kidney cancer Neg Hx     Social History Social History  Substance Use Topics  . Smoking status: Never  Smoker  . Smokeless tobacco: Never Used  . Alcohol use No    Review of Systems Patient denies headaches, rhinorrhea, blurry vision, numbness, shortness of breath, chest pain, edema, cough, abdominal pain, nausea, vomiting, diarrhea, dysuria, fevers, rashes or hallucinations unless otherwise stated above in HPI. ____________________________________________   PHYSICAL EXAM:  VITAL SIGNS: Vitals:   12/03/15 1024  BP: (!) 177/94  Pulse: 76  Resp: 18  Temp: 98.1 F (36.7 C)    Constitutional: Alert and oriented. Well appearing and in no acute distress. Eyes: Conjunctivae are normal. PERRL. EOMI. Head: Atraumatic. Nose: No congestion/rhinnorhea. Mouth/Throat: Mucous membranes are moist.  Oropharynx non-erythematous. Neck: No stridor. Painless ROM. No cervical spine tenderness to palpation Hematological/Lymphatic/Immunilogical: No cervical lymphadenopathy. Cardiovascular: Normal rate, regular rhythm. Grossly normal heart sounds.  Good peripheral circulation. Respiratory: Normal respiratory effort.  No retractions. Lungs CTAB. Gastrointestinal: Soft and nontender. No distention. No abdominal bruits. No CVA tenderness.  No abdominal bruits or pulsatile abdominal masses. Genitourinary:  Musculoskeletal: No lower extremity tenderness nor edema.  No joint effusions.  She does have reproducible tenderness to palpation on the right paralumbar spinal muscles. No midline tenderness to palpation.  No sciatica. 5 out of 5 strength bilaterally to lower extremities. Neurologic:  Normal speech and language. No gross focal neurologic deficits are appreciated. No gait instability. Skin:  Skin is warm, dry and intact. No rash noted. Psychiatric: Mood and affect are normal. Speech  and behavior are normal.  ____________________________________________   LABS (all labs ordered are listed, but only abnormal results are displayed)  Results for orders placed or performed during the hospital encounter of  12/03/15 (from the past 24 hour(s))  Urinalysis complete, with microscopic     Status: Abnormal   Collection Time: 12/03/15 10:26 AM  Result Value Ref Range   Color, Urine YELLOW (A) YELLOW   APPearance HAZY (A) CLEAR   Glucose, UA NEGATIVE NEGATIVE mg/dL   Bilirubin Urine NEGATIVE NEGATIVE   Ketones, ur NEGATIVE NEGATIVE mg/dL   Specific Gravity, Urine 1.012 1.005 - 1.030   Hgb urine dipstick NEGATIVE NEGATIVE   pH 8.0 5.0 - 8.0   Protein, ur NEGATIVE NEGATIVE mg/dL   Nitrite NEGATIVE NEGATIVE   Leukocytes, UA NEGATIVE NEGATIVE   RBC / HPF 0-5 0 - 5 RBC/hpf   WBC, UA 0-5 0 - 5 WBC/hpf   Bacteria, UA FEW (A) NONE SEEN   Squamous Epithelial / LPF 6-30 (A) NONE SEEN   Mucous PRESENT   Comprehensive metabolic panel     Status: Abnormal   Collection Time: 12/03/15 10:30 AM  Result Value Ref Range   Sodium 137 135 - 145 mmol/L   Potassium 3.8 3.5 - 5.1 mmol/L   Chloride 105 101 - 111 mmol/L   CO2 27 22 - 32 mmol/L   Glucose, Bld 91 65 - 99 mg/dL   BUN 10 6 - 20 mg/dL   Creatinine, Ser 1.610.87 0.44 - 1.00 mg/dL   Calcium 8.8 (L) 8.9 - 10.3 mg/dL   Total Protein 7.9 6.5 - 8.1 g/dL   Albumin 3.9 3.5 - 5.0 g/dL   AST 19 15 - 41 U/L   ALT 14 14 - 54 U/L   Alkaline Phosphatase 49 38 - 126 U/L   Total Bilirubin 0.9 0.3 - 1.2 mg/dL   GFR calc non Af Amer >60 >60 mL/min   GFR calc Af Amer >60 >60 mL/min   Anion gap 5 5 - 15  CBC     Status: Abnormal   Collection Time: 12/03/15 10:30 AM  Result Value Ref Range   WBC 7.1 3.6 - 11.0 K/uL   RBC 4.92 3.80 - 5.20 MIL/uL   Hemoglobin 11.5 (L) 12.0 - 16.0 g/dL   HCT 09.635.9 04.535.0 - 40.947.0 %   MCV 72.9 (L) 80.0 - 100.0 fL   MCH 23.3 (L) 26.0 - 34.0 pg   MCHC 32.0 32.0 - 36.0 g/dL   RDW 81.116.0 (H) 91.411.5 - 78.214.5 %   Platelets 276 150 - 440 K/uL   ____________________________________________  _______________________________  RADIOLOGY  KUB without evidence of stone.  Bedside ultrasound shows no evidence of hydronephrosis. There is a  normal-appearing spleen and liver. No perinephric edema. ____________________________________________   PROCEDURES  Procedure(s) performed: none    Critical Care performed: no ____________________________________________   INITIAL IMPRESSION / ASSESSMENT AND PLAN / ED COURSE  Pertinent labs & imaging results that were available during my care of the patient were reviewed by me and considered in my medical decision making (see chart for details).  DDX: Pyelo, ureterolithiasis, muscle skeletal pain, AAA, lumbosacral strain  Shay L Charyl DancerGant is a 40 y.o. who presents to the ED with complaint of bilateral low back and right flank pain.  No history of injury or trauma. No recent back instrumentation/procedures. No fevers. Denies cord compression symptoms. No bowel/bladder incontinence or retention, no LE weakness. VSS in ED. Exam with no LE weakness bilat., no sensory deficits,  normal DTRs, no clonus, no saddle anesthesia. Pain with palpation of back and with trunk movement. Likely MSK related pain, probable lumbar strain.  History and physical exam less consistent with kidney stone or pyelonephritis as her urine is without hematuria or pyuria and she has no hydronephrosis on ultrasound. We'll order a KUB to evaluate for march stones. Her abdominal exam is soft and benign. I do not appreciate any pulsatile mouth masses or abdominal bruits. Do not feel is consistent with a AAA based on her age and risk factors. Treatments will include observation, analgesia, and arrange appropriate follow up for recheck.  Clinical picture is not consistent with epidural abscess , fracture, or cauda equina syndrome. Plan supportive care, follow up for recheck   Clinical Course  Comment By Time  KUB does not show any evidence of massive stone. Pain improved. X-ray does show evidence of increased stool burden. We'll discharge with by mouth medications and follow-up PCP.  Have discussed with the patient and  available family all diagnostics and treatments performed thus far and all questions were answered to the best of my ability. The patient demonstrates understanding and agreement with plan.  Willy Eddy, MD 09/22 1219     ____________________________________________   FINAL CLINICAL IMPRESSION(S) / ED DIAGNOSES  Final diagnoses:  Flank pain      NEW MEDICATIONS STARTED DURING THIS VISIT:  New Prescriptions   No medications on file     Note:  This document was prepared using Dragon voice recognition software and may include unintentional dictation errors.    Willy Eddy, MD 12/03/15 2213

## 2015-12-03 NOTE — Discharge Instructions (Signed)

## 2015-12-03 NOTE — ED Triage Notes (Signed)
Pt has know right and left kidney stones. Woke up today with pain worse and pain meds not helping. Has had vomiting today.

## 2016-06-05 ENCOUNTER — Other Ambulatory Visit
Admission: RE | Admit: 2016-06-05 | Discharge: 2016-06-05 | Disposition: A | Discharge status: Home / Self Care | Payer: Medicaid Other | Source: Ambulatory Visit | Attending: Nurse Practitioner | Admitting: Nurse Practitioner

## 2016-06-05 ENCOUNTER — Ambulatory Visit
Admission: RE | Admit: 2016-06-05 | Discharge: 2016-06-05 | Disposition: A | Discharge status: Home / Self Care | Payer: Medicaid Other | Source: Ambulatory Visit | Attending: Nurse Practitioner | Admitting: Nurse Practitioner

## 2016-06-05 ENCOUNTER — Other Ambulatory Visit: Payer: Self-pay | Admitting: Nurse Practitioner

## 2016-06-05 DIAGNOSIS — R079 Chest pain, unspecified: Secondary | ICD-10-CM | POA: Diagnosis present

## 2016-06-05 DIAGNOSIS — R0789 Other chest pain: Secondary | ICD-10-CM | POA: Diagnosis present

## 2016-06-05 DIAGNOSIS — R0602 Shortness of breath: Secondary | ICD-10-CM | POA: Diagnosis not present

## 2016-06-05 DIAGNOSIS — R52 Pain, unspecified: Secondary | ICD-10-CM

## 2016-06-05 LAB — CBC
HEMATOCRIT: 33.8 % — AB (ref 35.0–47.0)
Hemoglobin: 11.1 g/dL — ABNORMAL LOW (ref 12.0–16.0)
MCH: 24 pg — ABNORMAL LOW (ref 26.0–34.0)
MCHC: 32.8 g/dL (ref 32.0–36.0)
MCV: 73.1 fL — AB (ref 80.0–100.0)
Platelets: 303 10*3/uL (ref 150–440)
RBC: 4.63 MIL/uL (ref 3.80–5.20)
RDW: 16.3 % — ABNORMAL HIGH (ref 11.5–14.5)
WBC: 6.9 10*3/uL (ref 3.6–11.0)

## 2016-06-05 LAB — BASIC METABOLIC PANEL
ANION GAP: 6 (ref 5–15)
BUN: 11 mg/dL (ref 6–20)
CHLORIDE: 102 mmol/L (ref 101–111)
CO2: 29 mmol/L (ref 22–32)
Calcium: 8.8 mg/dL — ABNORMAL LOW (ref 8.9–10.3)
Creatinine, Ser: 0.67 mg/dL (ref 0.44–1.00)
GFR calc non Af Amer: >60 mL/min (ref >60)
Glucose, Bld: 87 mg/dL (ref 65–99)
Potassium: 3.6 mmol/L (ref 3.5–5.1)
Sodium: 137 mmol/L (ref 135–145)

## 2016-06-05 LAB — FIBRIN DERIVATIVES D-DIMER (ARMC ONLY): Fibrin derivatives D-dimer (ARMC): 360.48 (ref 0.00–499.00)

## 2017-01-12 ENCOUNTER — Other Ambulatory Visit: Payer: Self-pay

## 2017-01-12 NOTE — Progress Notes (Signed)
01/15/2017 6:39 PM   Ashley Mcpherson Feb 18, 1976 [id number]  Referring provider: [facility] [place] [address] [place], [place] [address]  Chief Complaint  Patient presents with  . Follow-up    Nephrolithiasis    HPI: 41 yo AAF with bilateral punctate renal calculi who presents today for a one year follow up.  Background history Bilateral punctate renal calculi seen on  05/2015 CT stone protocol. The stone burden is similar to her previous bilateral punctate stone burden on a CT scan 2013.  She has never passed a stone or had seen an urologist before. The patient has no personal history of smoking.  KUB taken today noted no definitive calculi identified.    Today, she started to have right lower back pain.  She saw her PCP and was told she had blood and pus in her urine.  She was started on Amoxicillin.  She is taking Tramadol for the pain, but it just makes her tired.  She states that nothing helps the pain and nothing makes it worse.   She is not having fevers, chills, nausea or vomiting.  She is not having dysuria, suprapubic pain or gross hematuria.    She states she keeps getting UTI's.  I do not have her records available to me at this appointment.  She has not been cathed for her UA's.     PMH: Past Medical History:  Diagnosis Date  . Hypertension   . IBS (irritable bowel syndrome)   . Kidney stone     Surgical History: Past Surgical History:  Procedure Laterality Date  . CRYOABLATION  2010  . TUBAL LIGATION  2007  . VAGINAL HYSTERECTOMY  2013   Dr. Amalia Hailey [facility])    Home Medications:  Allergies as of 01/15/2017   No Known Allergies     Medication List        Accurate as of 01/15/17 11:59 PM. Always use your most recent med list.          LINZESS 290 MCG Caps capsule Generic drug:  linaclotide Take 290 mcg by mouth daily before breakfast.   tamsulosin 0.4 MG Caps capsule Commonly known as:  FLOMAX Take 0.4 mg by mouth. Reported on  07/29/2015   traMADol 50 MG tablet Commonly known as:  ULTRAM Take by mouth every 6 (six) hours as needed.       Allergies: No Known Allergies  Family History: Family History  Problem Relation Age of Onset  . Diabetes Mother   . Hypertension Mother   . Diabetes Father   . Hypertension Father   . Bladder Cancer Neg Hx   . Kidney cancer Neg Hx     Social History:  reports that  has never smoked. she has never used smokeless tobacco. She reports that she does not drink alcohol or use drugs.  ROS: UROLOGY Frequent Urination?: No Hard to postpone urination?: No Burning/pain with urination?: Yes Get up at night to urinate?: No Leakage of urine?: No Urine stream starts and stops?: No Trouble starting stream?: No Do you have to strain to urinate?: No Blood in urine?: No Urinary tract infection?: Yes Sexually transmitted disease?: No Injury to kidneys or bladder?: No Painful intercourse?: No Weak stream?: No Currently pregnant?: No Vaginal bleeding?: No Last menstrual period?: n  Gastrointestinal Nausea?: No Vomiting?: No Indigestion/heartburn?: No Diarrhea?: No Constipation?: No  Constitutional Fever: No Night sweats?: No Weight loss?: No Fatigue?: Yes  Skin Skin rash/lesions?: No Itching?: No  Eyes Blurred vision?:  No Double vision?: No  Ears/Nose/Throat Sore throat?: No Sinus problems?: No  Hematologic/Lymphatic Swollen glands?: No Easy bruising?: No  Cardiovascular Leg swelling?: No Chest pain?: No  Respiratory Cough?: No Shortness of breath?: No  Endocrine Excessive thirst?: No  Musculoskeletal Back pain?: No Joint pain?: No  Neurological Headaches?: No Dizziness?: No  Psychologic Depression?: No Anxiety?: No  Physical Exam: BP 138/78   Pulse 78   Ht 5' 4" (1.626 m)   Wt 166 lb 1.6 oz (75.3 kg)   BMI 28.51 kg/m   Constitutional:  Alert and oriented, No acute distress. HEENT: [place] AT, moist mucus membranes.  Trachea  midline, no masses. Cardiovascular: No clubbing, cyanosis, or edema. Respiratory: Normal respiratory effort, no increased work of breathing. GI: Abdomen is soft, nontender, nondistended, no abdominal masses GU: No CVA tenderness.   Skin: No rashes, bruises or suspicious lesions. Lymph: No cervical or inguinal adenopathy. Neurologic: Grossly intact, no focal deficits, moving all 4 extremities. Psychiatric: Normal mood and affect.  Laboratory Data: Lab Results  Component Value Date   WBC 6.9 06/05/2016   HGB 11.1 (L) 06/05/2016   HCT 33.8 (L) 06/05/2016   MCV 73.1 (L) 06/05/2016   PLT 303 06/05/2016    Lab Results  Component Value Date   CREATININE 0.67 06/05/2016   I have reviewed the labs.  Pertinent Imaging: CLINICAL DATA:  History of kidney stones bilaterally  EXAM: ABDOMEN - 1 VIEW  COMPARISON:  12/03/2015  FINDINGS: Scattered large and small bowel gas is noted. A mild amount of fecal material is noted within the right colon. The kidneys are again obscured by bowel gas although no definitive calculi are seen. No ureteral stones are noted. No bony abnormality seen.  IMPRESSION: No definitive calculi identified.   Electronically Signed   By: Inez Catalina M.D.   On: 01/16/2017 09:12  Assessment & Plan:    1. Bilateral nonobstructing renal punctate calculi  - The patient has bilateral nonobstructing punctate calculi that are stable in size since 2017  - KUB today demonstrates no definitive stones  - RTC in one year for KUB  2.  Cystitis  - criteria for recurrent UTI has not been met with 2 or more infections in 6 months or 3 or greater infections in one year  - Patient is instructed to increase their water intake until the urine is pale yellow or clear (10 to 12 cups daily)   - probiotics (yogurt, oral pills or vaginal suppositories), take cranberry pills or drink the juice and Vitamin C 1,000 mg daily to acidify the urine should be added to their daily  regimen   - avoid soaking in tubs and wipe front to back after urinating   - benefit from core strengthening exercises has been seen.  We can refer her to PT if they desire - her insurance does not cover PT  - advised them to have CATH UA's for urinalysis and culture to prevent skin contamination of the specimen  - reviewed symptoms of UTI and advised not to have urine checked or be treated for UTI if not experiencing symptoms  - discussed antibiotic stewardship with the patient                                               Return in about 1 year (around 01/15/2018) for KUB .  SHANNON MCGOWAN,  PA-C  [facility] [address] [place], [place] [address] [phone]

## 2017-01-15 ENCOUNTER — Ambulatory Visit (INDEPENDENT_AMBULATORY_CARE_PROVIDER_SITE_OTHER): Payer: Medicaid Other | Admitting: Urology

## 2017-01-15 ENCOUNTER — Other Ambulatory Visit: Payer: Self-pay

## 2017-01-15 ENCOUNTER — Encounter: Payer: Self-pay | Admitting: Urology

## 2017-01-15 ENCOUNTER — Ambulatory Visit
Admission: RE | Admit: 2017-01-15 | Discharge: 2017-01-15 | Disposition: A | Discharge status: Home / Self Care | Payer: Medicaid Other | Source: Ambulatory Visit | Attending: Urology | Admitting: Urology

## 2017-01-15 VITALS — BP 138/78 | HR 78 | Ht 64.0 in | Wt 166.1 lb

## 2017-01-15 DIAGNOSIS — Z09 Encounter for follow-up examination after completed treatment for conditions other than malignant neoplasm: Secondary | ICD-10-CM | POA: Insufficient documentation

## 2017-01-15 DIAGNOSIS — N200 Calculus of kidney: Secondary | ICD-10-CM

## 2017-01-15 DIAGNOSIS — N3020 Other chronic cystitis without hematuria: Secondary | ICD-10-CM | POA: Diagnosis not present

## 2017-01-15 DIAGNOSIS — Z87442 Personal history of urinary calculi: Secondary | ICD-10-CM | POA: Diagnosis not present

## 2017-01-15 NOTE — Patient Instructions (Signed)

## 2017-04-17 ENCOUNTER — Ambulatory Visit: Payer: Medicaid Other | Admitting: Nurse Practitioner

## 2017-04-17 ENCOUNTER — Other Ambulatory Visit
Admission: RE | Admit: 2017-04-17 | Discharge: 2017-04-17 | Disposition: A | Discharge status: Home / Self Care | Payer: Medicaid Other | Source: Ambulatory Visit | Attending: Nurse Practitioner | Admitting: Nurse Practitioner

## 2017-04-17 ENCOUNTER — Encounter: Payer: Self-pay | Admitting: Nurse Practitioner

## 2017-04-17 VITALS — BP 144/96 | HR 69 | Resp 16 | Ht 64.0 in | Wt 167.0 lb

## 2017-04-17 DIAGNOSIS — M545 Low back pain: Secondary | ICD-10-CM

## 2017-04-17 DIAGNOSIS — R631 Polydipsia: Secondary | ICD-10-CM

## 2017-04-17 DIAGNOSIS — R358 Other polyuria: Secondary | ICD-10-CM | POA: Insufficient documentation

## 2017-04-17 DIAGNOSIS — N390 Urinary tract infection, site not specified: Secondary | ICD-10-CM

## 2017-04-17 DIAGNOSIS — R5383 Other fatigue: Secondary | ICD-10-CM | POA: Diagnosis not present

## 2017-04-17 DIAGNOSIS — I10 Essential (primary) hypertension: Secondary | ICD-10-CM | POA: Insufficient documentation

## 2017-04-17 DIAGNOSIS — R319 Hematuria, unspecified: Secondary | ICD-10-CM

## 2017-04-17 LAB — POCT URINALYSIS DIPSTICK
Bilirubin, UA: NEGATIVE
Glucose, UA: NEGATIVE
Ketones, UA: NEGATIVE
LEUKOCYTES UA: NEGATIVE
NITRITE UA: NEGATIVE
Protein, UA: NEGATIVE
Spec Grav, UA: 1.01 (ref 1.010–1.025)
Urobilinogen, UA: 0.2 E.U./dL
pH, UA: 5 (ref 5.0–8.0)

## 2017-04-17 LAB — COMPREHENSIVE METABOLIC PANEL
ALT: 21 U/L (ref 14–54)
AST: 20 U/L (ref 15–41)
Albumin: 3.9 g/dL (ref 3.5–5.0)
Alkaline Phosphatase: 49 U/L (ref 38–126)
Anion gap: 8 (ref 5–15)
BILIRUBIN TOTAL: 0.8 mg/dL (ref 0.3–1.2)
BUN: 9 mg/dL (ref 6–20)
CO2: 27 mmol/L (ref 22–32)
CREATININE: 0.84 mg/dL (ref 0.44–1.00)
Calcium: 8.8 mg/dL — ABNORMAL LOW (ref 8.9–10.3)
Chloride: 103 mmol/L (ref 101–111)
GFR calc Af Amer: >60 mL/min (ref >60)
GLUCOSE: 90 mg/dL (ref 65–99)
Potassium: 3.8 mmol/L (ref 3.5–5.1)
Sodium: 138 mmol/L (ref 135–145)
Total Protein: 7.9 g/dL (ref 6.5–8.1)

## 2017-04-17 LAB — CBC WITH DIFFERENTIAL/PLATELET
BASOS ABS: 0 10*3/uL (ref 0–0.1)
Basophils Relative: 0 %
Eosinophils Absolute: 0 10*3/uL (ref 0–0.7)
Eosinophils Relative: 1 %
HEMATOCRIT: 34.1 % — AB (ref 35.0–47.0)
Hemoglobin: 10.9 g/dL — ABNORMAL LOW (ref 12.0–16.0)
LYMPHS PCT: 32 %
Lymphs Abs: 1.9 10*3/uL (ref 1.0–3.6)
MCH: 23.3 pg — ABNORMAL LOW (ref 26.0–34.0)
MCHC: 31.9 g/dL — ABNORMAL LOW (ref 32.0–36.0)
MCV: 72.9 fL — AB (ref 80.0–100.0)
Monocytes Absolute: 0.4 10*3/uL (ref 0.2–0.9)
Monocytes Relative: 6 %
NEUTROS ABS: 3.7 10*3/uL (ref 1.4–6.5)
Neutrophils Relative %: 61 %
Platelets: 336 10*3/uL (ref 150–440)
RBC: 4.68 MIL/uL (ref 3.80–5.20)
RDW: 16 % — ABNORMAL HIGH (ref 11.5–14.5)
WBC: 6 10*3/uL (ref 3.6–11.0)

## 2017-04-17 LAB — HEMOGLOBIN A1C
HEMOGLOBIN A1C: 5.7 % — AB (ref 4.8–5.6)
Mean Plasma Glucose: 116.89 mg/dL

## 2017-04-17 LAB — T4, FREE: Free T4: 0.84 ng/dL (ref 0.61–1.12)

## 2017-04-17 LAB — TSH: TSH: 1.937 u[IU]/mL (ref 0.350–4.500)

## 2017-04-17 MED ORDER — TIZANIDINE HCL 2 MG PO TABS: 2.0000 mg | ORAL_TABLET | Freq: Two times a day (BID) | ORAL | 1 refills | Status: DC | PRN

## 2017-04-17 NOTE — Progress Notes (Addendum)
Ashley Mcpherson [address] [place], [place] [address]  Internal MEDICINE  Office Visit Note  Patient Name: Ashley Mcpherson  [id number]  [id number]  Date of Service: 04/17/2017  Chief Complaint  Patient presents with  . Polydipsia  . Polyuria    The patient is here for routine follow up. Today, she is c/o significant increased in thirst and need to urinate. This has gradually become worse over the past few months. No associated appetite change or weight loss. She denies nausea or vomiting. She does state that dieabetes runs in her family.    Other  This is a new problem. The current episode started more than 1 month ago. The problem occurs constantly. The problem has been unchanged. Associated symptoms include fatigue, headaches, myalgias, urinary symptoms and a visual change. Pertinent negatives include no abdominal pain, anorexia, arthralgias, chest pain, congestion, coughing, nausea, rash, sore throat or vomiting. Nothing aggravates the symptoms. Treatments tried: drinking water  The treatment provided mild relief.    Pt is here for routine follow up.    Current Medication: Outpatient Encounter Medications as of 04/17/2017  Medication Sig  . citalopram (CELEXA) 10 MG tablet Take 10 mg by mouth. Take 2 tab daily  . linaclotide (LINZESS) 290 MCG CAPS capsule Take 290 mcg by mouth daily before breakfast.  . omeprazole (PRILOSEC) 40 MG capsule Take 40 mg by mouth daily.  . traMADol (ULTRAM) 50 MG tablet Take by mouth every 6 (six) hours as needed.  . [DISCONTINUED] tamsulosin (FLOMAX) 0.4 MG CAPS capsule Take 0.4 mg by mouth. Reported on 07/29/2015  . tiZANidine (ZANAFLEX) 2 MG tablet Take 1 tablet (2 mg total) by mouth 2 (two) times daily as needed for muscle spasms.   No facility-administered encounter medications on file as of 04/17/2017.     Surgical History: Past Surgical History:  Procedure Laterality Date  . CRYOABLATION  2010  . TUBAL LIGATION  2007  . VAGINAL  HYSTERECTOMY  2013   Dr. Logan Bores [facility])    Medical History: Past Medical History:  Diagnosis Date  . Hypertension   . IBS (irritable bowel syndrome)   . Kidney stone     Family History: Family History  Problem Relation Age of Onset  . Diabetes Mother   . Hypertension Mother   . Diabetes Father   . Hypertension Father   . Bladder Cancer Neg Hx   . Kidney cancer Neg Hx     Social History   Socioeconomic History  . Marital status: Single    Spouse name: Not on file  . Number of children: Not on file  . Years of education: Not on file  . Highest education level: Not on file  Social Needs  . Financial resource strain: Not on file  . Food insecurity - worry: Not on file  . Food insecurity - inability: Not on file  . Transportation needs - medical: Not on file  . Transportation needs - non-medical: Not on file  Occupational History  . Not on file  Tobacco Use  . Smoking status: Never Smoker  . Smokeless tobacco: Never Used  Substance and Sexual Activity  . Alcohol use: No  . Drug use: No  . Sexual activity: Not on file  Other Topics Concern  . Not on file  Social History Narrative  . Not on file      Review of Systems  Constitutional: Positive for fatigue.  HENT: Negative for congestion, postnasal drip, rhinorrhea, sore throat and voice change.  Dry throat, especially I nthe mornings.  Eyes: Positive for visual disturbance.       Increased blurry vision, especially at night.  Respiratory: Negative for cough and wheezing.   Cardiovascular: Negative for chest pain and palpitations.  Gastrointestinal: Negative for abdominal pain, anorexia, nausea and vomiting.  Endocrine: Positive for polydipsia and polyuria.  Genitourinary: Positive for frequency and urgency.  Musculoskeletal: Positive for back pain and myalgias. Negative for arthralgias.  Skin: Negative for color change, pallor, rash and wound.  Allergic/Immunologic: Negative for environmental allergies,  food allergies and immunocompromised state.  Neurological: Positive for headaches.  Hematological: Negative for adenopathy. Does not bruise/bleed easily.    Today's Vitals   04/17/17 1042  BP: (!) 144/96  Pulse: 69  Resp: 16  SpO2: 98%  Weight: 167 lb (75.8 kg)  Height: 5\' 4"  (1.626 m)    Physical Exam  Constitutional: She is oriented to person, place, and time. She appears well-developed and well-nourished.  HENT:  Head: Normocephalic.  Eyes: Pupils are equal, round, and reactive to light.  Neck: Normal range of motion. Neck supple.  Cardiovascular: Normal rate and regular rhythm.  Pulmonary/Chest: Effort normal and breath sounds normal. She has no wheezes.  Abdominal: Soft. There is no tenderness.  Genitourinary:  Genitourinary Comments: Urine sample showing trace blood.   Musculoskeletal: Normal range of motion.       Arms: Neurological: She is alert and oriented to person, place, and time.  Skin: Skin is warm and dry.  Psychiatric: She has a normal mood and affect. Her behavior is normal. Judgment and thought content normal.  Nursing note and vitals reviewed.   Assessment/Plan:  1. Low back pain, unspecified back pain laterality, unspecified chronicity, with sciatica presence unspecified - tiZANidine (ZANAFLEX) 2 MG tablet; Take 1 tablet (2 mg total) by mouth 2 (two) times daily as needed for muscle spasms.  Dispense: 45 tablet; Refill: 1 - POCT Urinalysis Dipstick. U/a showing trace blood only.   2. Urinary tract infection with hematuria, site unspecified - CULTURE, URINE COMPREHENSIVE  3. Polydipsia Labs ordered. Check CMP with HgbA1c and treat as indicated.   4. Essential hypertension Generally stable. Continue bp medications as prescribed.   General Counseling: Nailah verbalizes understanding of the findings of todays visit and agrees with plan of treatment. I have discussed any further diagnostic evaluation that may be needed or ordered today. We also  reviewed her medications today. she has been encouraged to call the office with any questions or concerns that should arise related to todays visit.  This patient was seen by Vincent GrosHeather Bryce Cheever, FNP- C in Collaboration with Dr Lyndon CodeFozia M Khan as a part of collaborative care agreement    Orders Placed This Encounter  Procedures  . CULTURE, URINE COMPREHENSIVE  . POCT Urinalysis Dipstick    Meds ordered this encounter  Medications  . tiZANidine (ZANAFLEX) 2 MG tablet    Sig: Take 1 tablet (2 mg total) by mouth 2 (two) times daily as needed for muscle spasms.    Dispense:  45 tablet    Refill:  1    Order Specific Question:   Supervising Provider    Answer:   Lyndon CodeKHAN, FOZIA M [[id number]]    Time spent: 8320 Minutes     Dr Lyndon CodeFozia M Khan Internal medicine

## 2017-04-19 LAB — CULTURE, URINE COMPREHENSIVE

## 2017-07-19 ENCOUNTER — Ambulatory Visit: Payer: Medicaid Other | Admitting: Nurse Practitioner

## 2017-07-19 ENCOUNTER — Encounter: Payer: Self-pay | Admitting: Nurse Practitioner

## 2017-07-19 VITALS — BP 141/95 | HR 78 | Temp 97.2°F | Resp 16 | Ht 64.0 in | Wt 168.0 lb

## 2017-07-19 DIAGNOSIS — N390 Urinary tract infection, site not specified: Secondary | ICD-10-CM | POA: Diagnosis not present

## 2017-07-19 DIAGNOSIS — M545 Low back pain: Secondary | ICD-10-CM

## 2017-07-19 DIAGNOSIS — R319 Hematuria, unspecified: Secondary | ICD-10-CM | POA: Diagnosis not present

## 2017-07-19 DIAGNOSIS — G8929 Other chronic pain: Secondary | ICD-10-CM | POA: Diagnosis not present

## 2017-07-19 DIAGNOSIS — R300 Dysuria: Secondary | ICD-10-CM | POA: Diagnosis not present

## 2017-07-19 LAB — POCT URINALYSIS DIPSTICK
BILIRUBIN UA: NEGATIVE
GLUCOSE UA: NEGATIVE
Ketones, UA: NEGATIVE
Nitrite, UA: NEGATIVE
Protein, UA: NEGATIVE
Spec Grav, UA: 1.01 (ref 1.010–1.025)
Urobilinogen, UA: 0.2 E.U./dL
pH, UA: 7 (ref 5.0–8.0)

## 2017-07-19 MED ORDER — PHENAZOPYRIDINE HCL 200 MG PO TABS: 200.0000 mg | ORAL_TABLET | Freq: Three times a day (TID) | ORAL | 0 refills | Status: DC | PRN

## 2017-07-19 MED ORDER — AMOXICILLIN-POT CLAVULANATE 875-125 MG PO TABS: 1.0000 | ORAL_TABLET | Freq: Two times a day (BID) | ORAL | 0 refills | Status: DC

## 2017-07-19 NOTE — Progress Notes (Signed)
[facility] [address] [place], [place] [address]  Internal MEDICINE  Office Visit Note  Patient Name: Ashley Mcpherson  [id number]  [id number]  Date of Service: 07/19/2017   Pt is here for routine follow up.    Chief Complaint  Patient presents with  . Urinary Tract Infection    Urinary Tract Infection   This is a recurrent problem. The current episode started in the past 7 days. The problem occurs intermittently. The problem has been unchanged. The quality of the pain is described as aching. The patient is experiencing no pain. There has been no fever. She is not sexually active. There is a history of pyelonephritis. Associated symptoms include frequency, hesitancy and urgency. Pertinent negatives include no chills, nausea or vomiting. She has tried antibiotics (was on cipro for 7 days a few weeks ago. did not really help. ) for the symptoms. The treatment provided mild relief. Her past medical history is significant for kidney stones and recurrent UTIs.       Current Medication: Outpatient Encounter Medications as of 07/19/2017  Medication Sig  . amoxicillin-clavulanate (AUGMENTIN) 875-125 MG tablet Take 1 tablet by mouth 2 (two) times daily.  . citalopram (CELEXA) 10 MG tablet Take 10 mg by mouth. Take 2 tab daily  . linaclotide (LINZESS) 290 MCG CAPS capsule Take 290 mcg by mouth daily before breakfast.  . omeprazole (PRILOSEC) 40 MG capsule Take 40 mg by mouth daily.  . phenazopyridine (PYRIDIUM) 200 MG tablet Take 1 tablet (200 mg total) by mouth 3 (three) times daily as needed for pain.  Marland Kitchen tiZANidine (ZANAFLEX) 2 MG tablet Take 1 tablet (2 mg total) by mouth 2 (two) times daily as needed for muscle spasms.  . traMADol (ULTRAM) 50 MG tablet Take by mouth every 6 (six) hours as needed.   No facility-administered encounter medications on file as of 07/19/2017.     Surgical History: Past Surgical History:  Procedure Laterality Date  . CRYOABLATION  2010  . TUBAL  LIGATION  2007  . VAGINAL HYSTERECTOMY  2013   Dr. Logan Bores [facility])    Medical History: Past Medical History:  Diagnosis Date  . Hypertension   . IBS (irritable bowel syndrome)   . Kidney stone     Family History: Family History  Problem Relation Age of Onset  . Diabetes Mother   . Hypertension Mother   . Diabetes Father   . Hypertension Father   . Bladder Cancer Neg Hx   . Kidney cancer Neg Hx     Social History   Socioeconomic History  . Marital status: Single    Spouse name: Not on file  . Number of children: Not on file  . Years of education: Not on file  . Highest education level: Not on file  Occupational History  . Not on file  Social Needs  . Financial resource strain: Not on file  . Food insecurity:    Worry: Not on file    Inability: Not on file  . Transportation needs:    Medical: Not on file    Non-medical: Not on file  Tobacco Use  . Smoking status: Never Smoker  . Smokeless tobacco: Never Used  Substance and Sexual Activity  . Alcohol use: No  . Drug use: No  . Sexual activity: Not on file  Lifestyle  . Physical activity:    Days per week: Not on file    Minutes per session: Not on file  . Stress: Not on  file  Relationships  . Social connections:    Talks on phone: Not on file    Gets together: Not on file    Attends religious service: Not on file    Active member of club or organization: Not on file    Attends meetings of clubs or organizations: Not on file    Relationship status: Not on file  . Intimate partner violence:    Fear of current or ex partner: Not on file    Emotionally abused: Not on file    Physically abused: Not on file    Forced sexual activity: Not on file  Other Topics Concern  . Not on file  Social History Narrative  . Not on file      Review of Systems  Constitutional: Negative for chills, fatigue and unexpected weight change.  HENT: Positive for postnasal drip. Negative for congestion, rhinorrhea, sneezing  and sore throat.   Eyes: Negative for redness.  Respiratory: Negative for cough, chest tightness and shortness of breath.   Cardiovascular: Negative for chest pain and palpitations.  Gastrointestinal: Negative for abdominal pain, constipation, diarrhea, nausea and vomiting.  Genitourinary: Positive for frequency, hesitancy and urgency. Negative for dysuria.  Musculoskeletal: Negative for arthralgias, back pain, joint swelling and neck pain.  Skin: Negative for rash.  Neurological: Negative.  Negative for tremors and numbness.  Hematological: Negative for adenopathy. Does not bruise/bleed easily.  Psychiatric/Behavioral: Negative for behavioral problems (Depression), sleep disturbance and suicidal ideas. The patient is not nervous/anxious.     Today's Vitals   07/19/17 1059  BP: (!) 141/95  Pulse: 78  Resp: 16  Temp: (!) 97.2 F (36.2 C)  SpO2: 100%  Weight: 168 lb (76.2 kg)  Height:  (1.626 m)    Physical Exam  Constitutional: She is oriented to person, place, and time. She appears well-developed and well-nourished.  HENT:  Head: Normocephalic.  Eyes: Pupils are equal, round, and reactive to light. Conjunctivae are normal.  Neck: Normal range of motion. Neck supple. No thyromegaly present.  Cardiovascular: Normal rate, regular rhythm and normal heart sounds.  Pulmonary/Chest: Effort normal and breath sounds normal. No respiratory distress. She has no wheezes.  Abdominal: Soft. Bowel sounds are normal. There is no tenderness.  Genitourinary:  Genitourinary Comments: Urine sample is positive for trace blood and trace WBC  Musculoskeletal: Normal range of motion.       Arms: Lymphadenopathy:    She has no cervical adenopathy.  Neurological: She is alert and oriented to person, place, and time.  Skin: Skin is warm and dry.  Psychiatric: She has a normal mood and affect. Her behavior is normal. Judgment and thought content normal.  Nursing note and vitals  reviewed.  Assessment/Plan: 1. Urinary tract infection with hematuria, site unspecified Start augmentin  twice daily for 10 days. Send urine sample for culture and sensitivity and adjust antibiotic therapy as indicated.  - amoxicillin-clavulanate (AUGMENTIN) 875-125 MG tablet; Take 1 tablet by mouth 2 (two) times daily.  Dispense: 20 tablet; Refill: 0 - CULTURE, URINE COMPREHENSIVE  2. Dysuria Pyridium  may be taken up to TID as needed for bladder pain/spasms.  - POCT Urinalysis Dipstick - phenazopyridine (PYRIDIUM) 200 MG tablet; Take 1 tablet (200 mg total) by mouth 3 (three) times daily as needed for pain.  Dispense: 10 tablet; Refill: 0  3. Chronic bilateral low back pain, with sciatica presence unspecified Continue NSAIDs and muscle relaxants as needed and as prescribed.    General Counseling: Rosealie verbalizes  understanding of the findings of todays visit and agrees with plan of treatment. I have discussed any further diagnostic evaluation that may be needed or ordered today. We also reviewed her medications today. she has been encouraged to call the office with any questions or concerns that should arise related to todays visit.  This patient was seen by Vincent Gros, FNP- C in Collaboration with Dr Lyndon Code as a part of collaborative care agreement    Orders Placed This Encounter  Procedures  . POCT Urinalysis Dipstick    Meds ordered this encounter  Medications  . amoxicillin-clavulanate (AUGMENTIN) 875-125 MG tablet    Sig: Take 1 tablet by mouth 2 (two) times daily.    Dispense:  20 tablet    Refill:  0    Order Specific Question:   Supervising Provider    Answer:   Lyndon Code [[id number]]  . phenazopyridine (PYRIDIUM) 200 MG tablet    Sig: Take 1 tablet (200 mg total) by mouth 3 (three) times daily as needed for pain.    Dispense:  10 tablet    Refill:  0    Order Specific Question:   Supervising Provider    Answer:   Lyndon Code [[id number]]    Time  spent: 24 Minutes   Dr Lyndon Code Internal medicine

## 2017-07-22 LAB — CULTURE, URINE COMPREHENSIVE

## 2017-08-12 DIAGNOSIS — M545 Low back pain, unspecified: Secondary | ICD-10-CM | POA: Insufficient documentation

## 2017-08-12 DIAGNOSIS — N390 Urinary tract infection, site not specified: Secondary | ICD-10-CM | POA: Insufficient documentation

## 2017-08-12 DIAGNOSIS — R319 Hematuria, unspecified: Secondary | ICD-10-CM

## 2017-08-12 DIAGNOSIS — R300 Dysuria: Secondary | ICD-10-CM | POA: Insufficient documentation

## 2017-08-12 DIAGNOSIS — G8929 Other chronic pain: Secondary | ICD-10-CM | POA: Insufficient documentation

## 2017-11-01 ENCOUNTER — Other Ambulatory Visit: Payer: Self-pay | Admitting: Adult Health

## 2017-11-01 ENCOUNTER — Ambulatory Visit: Payer: Medicaid Other | Admitting: Adult Health

## 2017-11-01 ENCOUNTER — Encounter: Payer: Self-pay | Admitting: Adult Health

## 2017-11-01 VITALS — BP 118/68 | HR 78 | Temp 97.9°F | Resp 16 | Ht 64.0 in | Wt 173.6 lb

## 2017-11-01 DIAGNOSIS — R109 Unspecified abdominal pain: Secondary | ICD-10-CM | POA: Diagnosis not present

## 2017-11-01 DIAGNOSIS — N390 Urinary tract infection, site not specified: Secondary | ICD-10-CM

## 2017-11-01 DIAGNOSIS — R300 Dysuria: Secondary | ICD-10-CM | POA: Diagnosis not present

## 2017-11-01 DIAGNOSIS — R319 Hematuria, unspecified: Secondary | ICD-10-CM

## 2017-11-01 LAB — POCT URINALYSIS DIPSTICK
Bilirubin, UA: NEGATIVE
Glucose, UA: NEGATIVE
Ketones, UA: NEGATIVE
NITRITE UA: NEGATIVE
PROTEIN UA: NEGATIVE
Spec Grav, UA: 1.015 (ref 1.010–1.025)
UROBILINOGEN UA: 0.2 U/dL
pH, UA: 6 (ref 5.0–8.0)

## 2017-11-01 MED ORDER — NITROFURANTOIN MONOHYD MACRO 100 MG PO CAPS: 100.0000 mg | ORAL_CAPSULE | Freq: Two times a day (BID) | ORAL | 0 refills | Status: DC

## 2017-11-01 NOTE — Patient Instructions (Signed)

## 2017-11-01 NOTE — Progress Notes (Signed)
[facility] [address] [place], [place] [address]  Internal MEDICINE  Office Visit Note  Patient Name: Ashley Mcpherson  [id number]  [id number]  Date of Service: 11/18/2017  Chief Complaint  Patient presents with  . Urinary Tract Infection    one week with symptoms   . Back Pain   HPI Pt is here for a sick visit. Pt reports symptoms of third UTI this calendar year.  She reports frequent episodes of these symptoms. She has a history of kidney stones, for which she has seen urology approximately 10 months ago.  She denies passing any stone.  Today she is reporting left flank pain that radiated around her side, and into her groin/lower abdoman. She describes this a lower stomach burning. She reports urinary frequency, and pain after she urinates.  She denies fever, or chills. She appears comfortable in exam room.      Current Medication:  Outpatient Encounter Medications as of 11/01/2017  Medication Sig  . citalopram (CELEXA) 10 MG tablet Take 10 mg by mouth. Take 2 tab daily  . linaclotide (LINZESS) 290 MCG CAPS capsule Take 290 mcg by mouth daily before breakfast.  . omeprazole (PRILOSEC) 40 MG capsule Take 40 mg by mouth daily.  Marland Kitchen tiZANidine (ZANAFLEX) 2 MG tablet Take 1 tablet (2 mg total) by mouth 2 (two) times daily as needed for muscle spasms.  . traMADol (ULTRAM) 50 MG tablet Take by mouth every 6 (six) hours as needed.  Marland Kitchen amoxicillin-clavulanate (AUGMENTIN) 875-125 MG tablet Take 1 tablet by mouth 2 (two) times daily. (Patient not taking: Reported on 11/01/2017)  . nitrofurantoin, macrocrystal-monohydrate, (MACROBID) 100 MG capsule Take 1 capsule (100 mg total) by mouth 2 (two) times daily.  . phenazopyridine (PYRIDIUM) 200 MG tablet Take 1 tablet (200 mg total) by mouth 3 (three) times daily as needed for pain. (Patient not taking: Reported on 11/01/2017)   No facility-administered encounter medications on file as of 11/01/2017.       Medical History: Past  Medical History:  Diagnosis Date  . Hypertension   . IBS (irritable bowel syndrome)   . Kidney stone      Vital Signs: BP 118/68   Pulse 78   Temp 97.9 F (36.6 C)   Resp 16   Ht 5\' 4"  (1.626 m)   Wt 173 lb 9.6 oz (78.7 kg)   SpO2 95%   BMI 29.80 kg/m    Review of Systems  Constitutional: Negative for chills, fatigue and unexpected weight change.  HENT: Negative for congestion, rhinorrhea, sneezing and sore throat.   Eyes: Negative for photophobia, pain and redness.  Respiratory: Negative for cough, chest tightness and shortness of breath.   Cardiovascular: Negative for chest pain and palpitations.  Gastrointestinal: Negative for abdominal pain, constipation, diarrhea, nausea and vomiting.  Endocrine: Negative.   Genitourinary: Negative for dysuria and frequency.  Musculoskeletal: Negative for arthralgias, back pain, joint swelling and neck pain.  Skin: Negative for rash.  Allergic/Immunologic: Negative.   Neurological: Negative for tremors and numbness.  Hematological: Negative for adenopathy. Does not bruise/bleed easily.  Psychiatric/Behavioral: Negative for behavioral problems and sleep disturbance. The patient is not nervous/anxious.     Physical Exam  Constitutional: She is oriented to person, place, and time. She appears well-developed and well-nourished. No distress.  HENT:  Head: Normocephalic and atraumatic.  Mouth/Throat: Oropharynx is clear and moist. No oropharyngeal exudate.  Eyes: Pupils are equal, round, and reactive to light. EOM are normal.  Neck: Normal range  of motion. Neck supple. No JVD present. No tracheal deviation present. No thyromegaly present.  Cardiovascular: Normal rate, regular rhythm and normal heart sounds. Exam reveals no gallop and no friction rub.  No murmur heard. Pulmonary/Chest: Effort normal and breath sounds normal. No respiratory distress. She has no wheezes. She has no rales. She exhibits no tenderness.  Abdominal: Soft. There  is no tenderness. There is no guarding.  Musculoskeletal: Normal range of motion.  Lymphadenopathy:    She has no cervical adenopathy.  Neurological: She is alert and oriented to person, place, and time. No cranial nerve deficit.  Skin: Skin is warm and dry. She is not diaphoretic.  Psychiatric: She has a normal mood and affect. Her behavior is normal. Judgment and thought content normal.  Nursing note and vitals reviewed.  Assessment/Plan: 1. Urinary tract infection with hematuria, site unspecified 3rd episode of UTI like complaints with flank pain in this year.  Start Macrobid, and follow up with urology for recheck of stones.  - CULTURE, URINE COMPREHENSIVE - nitrofurantoin, macrocrystal-monohydrate, (MACROBID) 100 MG capsule; Take 1 capsule (100 mg total) by mouth 2 (two) times daily.  Dispense: 10 capsule; Refill: 0 - Ambulatory referral to Urology  2. Flank pain Known kidney stones from 2017 CT.   3. Dysuria - POCT Urinalysis Dipstick  General Counseling: Ashley Mcpherson verbalizes understanding of the findings of todays visit and agrees with plan of treatment. I have discussed any further diagnostic evaluation that may be needed or ordered today. We also reviewed her medications today. she has been encouraged to call the office with any questions or concerns that should arise related to todays visit.   Orders Placed This Encounter  Procedures  . CULTURE, URINE COMPREHENSIVE  . Ambulatory referral to Urology  . POCT Urinalysis Dipstick    Meds ordered this encounter  Medications  . nitrofurantoin, macrocrystal-monohydrate, (MACROBID) 100 MG capsule    Sig: Take 1 capsule (100 mg total) by mouth 2 (two) times daily.    Dispense:  10 capsule    Refill:  0    Time spent: 25 Minutes  This patient was seen by Blima LedgerAdam Patty Lopezgarcia AGNP-C in Collaboration with Dr Lyndon CodeFozia M Khan as a part of collaborative care agreement

## 2017-11-06 LAB — CULTURE, URINE COMPREHENSIVE

## 2017-11-19 ENCOUNTER — Encounter: Payer: Self-pay | Admitting: Nurse Practitioner

## 2017-11-19 ENCOUNTER — Ambulatory Visit (INDEPENDENT_AMBULATORY_CARE_PROVIDER_SITE_OTHER): Payer: Medicaid Other | Admitting: Nurse Practitioner

## 2017-11-19 VITALS — BP 132/94 | HR 65 | Resp 16 | Ht 64.0 in | Wt 172.2 lb

## 2017-11-19 DIAGNOSIS — R300 Dysuria: Secondary | ICD-10-CM

## 2017-11-19 DIAGNOSIS — I10 Essential (primary) hypertension: Secondary | ICD-10-CM | POA: Diagnosis not present

## 2017-11-19 DIAGNOSIS — M545 Low back pain: Secondary | ICD-10-CM | POA: Diagnosis not present

## 2017-11-19 DIAGNOSIS — F4310 Post-traumatic stress disorder, unspecified: Secondary | ICD-10-CM

## 2017-11-19 DIAGNOSIS — G8929 Other chronic pain: Secondary | ICD-10-CM

## 2017-11-19 DIAGNOSIS — Z0001 Encounter for general adult medical examination with abnormal findings: Secondary | ICD-10-CM

## 2017-11-19 NOTE — Progress Notes (Signed)
[facility] [address] [place], [place] [address]  Internal MEDICINE  Office Visit Note  Patient Name: Ashley Mcpherson  [id number]  [id number]  Date of Service: 11/28/2017   Pt is here for routine health maintenance examination  Chief Complaint  Patient presents with  . Annual Exam    60month  . Depression     The patient is treated for depression and PTSD per [facility] hospital system. She feels as though her symptoms are being well managed. She has no new concerns or complaints to discuss.     Current Medication: Outpatient Encounter Medications as of 11/19/2017  Medication Sig  . citalopram (CELEXA) 10 MG tablet Take 10 mg by mouth. Take 2 tab daily  . linaclotide (LINZESS) 290 MCG CAPS capsule Take 290 mcg by mouth daily before breakfast.  . omeprazole (PRILOSEC) 40 MG capsule Take 40 mg by mouth daily.  Marland Kitchen tiZANidine (ZANAFLEX) 2 MG tablet Take 1 tablet (2 mg total) by mouth 2 (two) times daily as needed for muscle spasms.  . traMADol (ULTRAM) 50 MG tablet Take by mouth every 6 (six) hours as needed.  . [DISCONTINUED] nitrofurantoin, macrocrystal-monohydrate, (MACROBID) 100 MG capsule Take 1 capsule (100 mg total) by mouth 2 (two) times daily.  . phenazopyridine (PYRIDIUM) 200 MG tablet Take 1 tablet (200 mg total) by mouth 3 (three) times daily as needed for pain. (Patient not taking: Reported on 11/01/2017)  . [DISCONTINUED] amoxicillin-clavulanate (AUGMENTIN) 875-125 MG tablet Take 1 tablet by mouth 2 (two) times daily. (Patient not taking: Reported on 11/01/2017)   No facility-administered encounter medications on file as of 11/19/2017.     Surgical History: Past Surgical History:  Procedure Laterality Date  . CRYOABLATION  2010  . TUBAL LIGATION  2007  . VAGINAL HYSTERECTOMY  2013   Dr. Logan Bores [facility])    Medical History: Past Medical History:  Diagnosis Date  . Hypertension   . IBS (irritable bowel syndrome)   . Kidney stone     Family History: Family  History  Problem Relation Age of Onset  . Diabetes Mother   . Hypertension Mother   . Diabetes Father   . Hypertension Father   . Bladder Cancer Neg Hx   . Kidney cancer Neg Hx       Review of Systems  Constitutional: Positive for fatigue. Negative for activity change, chills and unexpected weight change.  HENT: Negative for congestion, postnasal drip, rhinorrhea, sneezing and sore throat.   Eyes: Negative.  Negative for redness.  Respiratory: Negative for cough, chest tightness and shortness of breath.   Cardiovascular: Negative for chest pain and palpitations.  Gastrointestinal: Negative for abdominal pain, constipation, diarrhea, nausea and vomiting.  Endocrine: Negative for cold intolerance, heat intolerance, polydipsia, polyphagia and polyuria.  Genitourinary: Negative for difficulty urinating, dysuria, frequency and urgency.  Musculoskeletal: Negative for arthralgias, back pain, joint swelling and neck pain.  Skin: Negative for rash.  Allergic/Immunologic: Negative for environmental allergies.  Neurological: Negative for dizziness, tremors, numbness and headaches.  Hematological: Negative for adenopathy. Does not bruise/bleed easily.  Psychiatric/Behavioral: Positive for dysphoric mood and sleep disturbance. Negative for behavioral problems (Depression) and suicidal ideas. The patient is nervous/anxious.      Today's Vitals   11/19/17 1021  BP: (!) 132/94  Pulse: 65  Resp: 16  SpO2: 100%  Weight: 172 lb 3.2 oz (78.1 kg)  Height: 5\' 4"  (1.626 m)    Physical Exam  Constitutional: She is oriented to person, place, and time. She  appears well-developed and well-nourished. No distress.  HENT:  Head: Normocephalic and atraumatic.  Nose: Nose normal.  Mouth/Throat: Oropharynx is clear and moist. No oropharyngeal exudate.  Eyes: Pupils are equal, round, and reactive to light. Conjunctivae and EOM are normal.  Neck: Normal range of motion. Neck supple. No JVD present. No  tracheal deviation present. No thyromegaly present.  Cardiovascular: Normal rate, regular rhythm, normal heart sounds and intact distal pulses. Exam reveals no gallop and no friction rub.  No murmur heard. Pulmonary/Chest: Effort normal and breath sounds normal. No respiratory distress. She has no wheezes. She has no rales. She exhibits no tenderness. Right breast exhibits no inverted nipple, no mass, no nipple discharge, no skin change and no tenderness. Left breast exhibits no inverted nipple, no mass, no nipple discharge, no skin change and no tenderness.  Abdominal: Soft. Bowel sounds are normal. There is no tenderness. There is no guarding.  Musculoskeletal: Normal range of motion.  Lymphadenopathy:    She has no cervical adenopathy.  Neurological: She is alert and oriented to person, place, and time. No cranial nerve deficit.  Skin: Skin is warm and dry. Capillary refill takes less than 2 seconds. She is not diaphoretic.  Psychiatric: Her speech is normal and behavior is normal. Judgment and thought content normal. Her mood appears anxious. Cognition and memory are normal. She exhibits a depressed mood.  Nursing note and vitals reviewed.    LABS: Recent Results (from the past 2160 hour(s))  POCT Urinalysis Dipstick     Status: Abnormal   Collection Time: 11/01/17  9:14 AM  Result Value Ref Range   Color, UA     Clarity, UA     Glucose, UA Negative Negative   Bilirubin, UA negative    Ketones, UA negative    Spec Grav, UA 1.015 1.010 - 1.025   Blood, UA moderate    pH, UA 6.0 5.0 - 8.0   Protein, UA Negative Negative   Urobilinogen, UA 0.2 0.2 or 1.0 E.U./dL   Nitrite, UA negative    Leukocytes, UA Trace (A) Negative   Appearance     Odor    CULTURE, URINE COMPREHENSIVE     Status: None   Collection Time: 11/01/17  9:15 AM  Result Value Ref Range   Urine Culture, Comprehensive Final report    Organism ID, Bacteria Lactobacillus species     Comment: 10,000-25,000 colony  forming units per mL Susceptibility not normally performed on this organism.   UA/M w/rflx Culture, Routine     Status: None   Collection Time: 11/19/17 10:31 AM  Result Value Ref Range   Specific Gravity, UA 1.019 1.005 - 1.030   pH, UA 6.0 5.0 - 7.5   Color, UA Yellow Yellow   Appearance Ur Clear Clear   Leukocytes, UA Negative Negative   Protein, UA Negative Negative/Trace   Glucose, UA Negative Negative   Ketones, UA Negative Negative   RBC, UA Negative Negative   Bilirubin, UA Negative Negative   Urobilinogen, Ur 0.2 0.2 - 1.0 mg/dL   Nitrite, UA Negative Negative   Microscopic Examination Comment     Comment: Microscopic follows if indicated.   Microscopic Examination See below:     Comment: Microscopic was indicated and was performed.   Urinalysis Reflex Comment     Comment: This specimen has reflexed to a Urine Culture.  Microscopic Examination     Status: Abnormal   Collection Time: 11/19/17 10:31 AM  Result Value Ref Range  WBC, UA 0-5 0 - 5 /hpf   RBC, UA None seen 0 - 2 /hpf   Epithelial Cells (non renal) >10 (A) 0 - 10 /hpf   Casts None seen None seen /lpf   Mucus, UA Present Not Estab.   Bacteria, UA Many (A) None seen/Few  Urine Culture, Reflex     Status: Abnormal   Collection Time: 11/19/17 10:31 AM  Result Value Ref Range   Urine Culture, Routine Final report (A)    Organism ID, Bacteria Escherichia coli (A)     Comment: 50,000-100,000 colony forming units per mL   Antimicrobial Susceptibility Comment     Comment:       ** S = Susceptible; I = Intermediate; R = Resistant **                    P = Positive; N = Negative             MICS are expressed in micrograms per mL    Antibiotic                 RSLT#1    RSLT#2    RSLT#3    RSLT#4 Amoxicillin/Clavulanic Acid    S Ampicillin                     S Cefepime                       S Ceftriaxone                    S Cefuroxime                     I Ciprofloxacin                  S Ertapenem                       S Gentamicin                     S Imipenem                       S Levofloxacin                   S Meropenem                      S Nitrofurantoin                 S Piperacillin/Tazobactam        S Tetracycline                   S Tobramycin                     S Trimethoprim/Sulfa             S   Urinalysis, Complete     Status: Abnormal   Collection Time: 11/20/17  1:49 PM  Result Value Ref Range   Specific Gravity, UA 1.020 1.005 - 1.030   pH, UA 7.0 5.0 - 7.5   Color, UA Yellow Yellow   Appearance Ur Cloudy (A) Clear   Leukocytes, UA Negative Negative   Protein, UA Trace (A) Negative/Trace   Glucose, UA Negative Negative   Ketones, UA Negative Negative   RBC, UA Trace (A)  Negative   Bilirubin, UA Negative Negative   Urobilinogen, Ur 0.2 0.2 - 1.0 mg/dL   Nitrite, UA Negative Negative   Microscopic Examination See below:   Microscopic Examination     Status: Abnormal   Collection Time: 11/20/17  1:49 PM  Result Value Ref Range   WBC, UA 0-5 0 - 5 /hpf   RBC, UA 0-2 0 - 2 /hpf   Epithelial Cells (non renal) >10 (H) 0 - 10 /hpf   Mucus, UA Present (A) Not Estab.   Bacteria, UA Many (A) None seen/Few  Bladder Scan (Post Void Residual) in office     Status: None   Collection Time: 11/20/17  1:56 PM  Result Value Ref Range   Scan Result 0mL    Assessment/Plan: 1. Encounter for general adult medical examination with abnormal findings Annual health maintenance exam today  2. Essential hypertension Currently being controlled through diet and exercise. Continue to monitor.   3. Chronic bilateral low back pain, with sciatica presence unspecified Continue to use NSAIDs, pain medication, and muscle relaxer as needed and as prescribed. Recommend use of heating pad as needed to relieve tight muscles.   4. Post traumatic stress disorder [facility]   5. Dysuria - UA/M w/rflx Culture, Routine  General Counseling:  Princes verbalizes understanding of the findings of todays visit and agrees with plan of treatment. I have discussed any further diagnostic evaluation that may be needed or ordered today. We also reviewed her medications today. she has been encouraged to call the office with any questions or concerns that should arise related to todays visit.    Counseling:  Hypertension Counseling:   The following hypertensive lifestyle modification were recommended and discussed:  1. Limiting alcohol intake to less than 1 oz/day of ethanol:(24 oz of beer or 8 oz of wine or 2 oz of 100-proof whiskey). 2. Take baby ASA 81 mg daily. 3. Importance of regular aerobic exercise and losing weight. 4. Reduce dietary saturated fat and cholesterol intake for overall cardiovascular health. 5. Maintaining adequate dietary potassium, calcium, and magnesium intake. 6. Regular monitoring of the blood pressure. 7. Reduce sodium intake to less than 100 mmol/day (less than 2.3 gm of sodium or less than 6 gm of sodium choride)   This patient was seen by Vincent Gros FNP Collaboration with Dr Lyndon Code as a part of collaborative care agreement  Orders Placed This Encounter  Procedures  . Microscopic Examination  . Urine Culture, Reflex  . UA/M w/rflx Culture, Routine      Time spent: 28 Minutes      Lyndon Code, MD  Internal Medicine

## 2017-11-20 ENCOUNTER — Encounter: Payer: Self-pay | Admitting: Urology

## 2017-11-20 ENCOUNTER — Ambulatory Visit (INDEPENDENT_AMBULATORY_CARE_PROVIDER_SITE_OTHER): Payer: Medicaid Other | Admitting: Urology

## 2017-11-20 VITALS — BP 138/87 | HR 78 | Ht 64.0 in | Wt 172.2 lb

## 2017-11-20 DIAGNOSIS — N200 Calculus of kidney: Secondary | ICD-10-CM | POA: Diagnosis not present

## 2017-11-20 DIAGNOSIS — N3020 Other chronic cystitis without hematuria: Secondary | ICD-10-CM

## 2017-11-20 LAB — BLADDER SCAN AMB NON-IMAGING

## 2017-11-20 NOTE — Progress Notes (Signed)
11/20/2017 5:33 PM   Aixa L Mccorvey Apr 30, 1975 [id number]  Referring provider: Ronnell Freshwater, NP [address] [place],  [id number]  Chief Complaint  Patient presents with  . Urinary Tract Infection    HPI: 42 yo AAF with bilateral punctate renal calculi who presents today as a referral for rUTI's.    Bilateral nephrolithiasis Bilateral punctate renal calculi seen on  05/2015 CT stone protocol. The stone burden is similar to her previous bilateral punctate stone burden on a CT scan 2013.  KUB on 01/15/2018 did not demonstrate any calculi.    ? rUTI's Patient states that she has had three urinary tract infections over the last year.  Reviewing her records,  she has had no documented positive urine cultures.  Her symptoms with a urinary tract infection consist of frequency, suprapubic pain, nausea and bilateral flank pain.   She is not having any urinary symptoms at this time.  Patient denies any gross hematuria, dysuria or suprapubic/flank pain.  Patient denies any fevers, chills, nausea or vomiting.   She admits to constipation and/or diarrhea due to IBS.  She does engage in good perineal hygiene. She does not take tub baths.  She does have SUI when she had a cold.  She is drinking 4 to 5 cups of water daily.   She does not drink coffee.  She does not drink soda.  She drinks apple juice.  She does not drink tea.  She does not drink alcohol.   Her UA today was positive for many bacteria.   Her PVR is 0 mL.    PMH: Past Medical History:  Diagnosis Date  . Hypertension   . IBS (irritable bowel syndrome)   . Kidney stone     Surgical History: Past Surgical History:  Procedure Laterality Date  . CRYOABLATION  2010  . TUBAL LIGATION  2007  . VAGINAL HYSTERECTOMY  2013   Dr. Amalia Hailey [facility])    Home Medications:  Allergies as of 11/20/2017      Reactions   Sulfa Antibiotics Other (See Comments)   Mouth sores      Medication List        Accurate as of 11/20/17 11:59 PM.  Always use your most recent med list.          citalopram 10 MG tablet Commonly known as:  CELEXA Take 10 mg by mouth. Take 2 tab daily   LINZESS 290 MCG Caps capsule Generic drug:  linaclotide Take 290 mcg by mouth daily before breakfast.   omeprazole 40 MG capsule Commonly known as:  PRILOSEC Take 40 mg by mouth daily.   phenazopyridine 200 MG tablet Commonly known as:  PYRIDIUM Take 1 tablet (200 mg total) by mouth 3 (three) times daily as needed for pain.   tiZANidine 2 MG tablet Commonly known as:  ZANAFLEX Take 1 tablet (2 mg total) by mouth 2 (two) times daily as needed for muscle spasms.   traMADol 50 MG tablet Commonly known as:  ULTRAM Take by mouth every 6 (six) hours as needed.       Allergies:  Allergies  Allergen Reactions  . Sulfa Antibiotics Other (See Comments)    Mouth sores    Family History: Family History  Problem Relation Age of Onset  . Diabetes Mother   . Hypertension Mother   . Diabetes Father   . Hypertension Father   . Bladder Cancer Neg Hx   . Kidney cancer Neg Hx     Social  History:  reports that she has never smoked. She has never used smokeless tobacco. She reports that she does not drink alcohol or use drugs.  ROS: UROLOGY Frequent Urination?: No Hard to postpone urination?: No Burning/pain with urination?: No Get up at night to urinate?: No Leakage of urine?: No Urine stream starts and stops?: No Trouble starting stream?: No Do you have to strain to urinate?: No Blood in urine?: Yes Urinary tract infection?: Yes Sexually transmitted disease?: No Injury to kidneys or bladder?: No Painful intercourse?: No Weak stream?: No Currently pregnant?: No Vaginal bleeding?: No Last menstrual period?: Hysterectomy  Gastrointestinal Nausea?: No Vomiting?: No Indigestion/heartburn?: No Diarrhea?: No Constipation?: No  Constitutional Fever: No Night sweats?: No Weight loss?: No Fatigue?: No  Skin Skin  rash/lesions?: No Itching?: No  Eyes Blurred vision?: No Double vision?: No  Ears/Nose/Throat Sore throat?: No Sinus problems?: No  Hematologic/Lymphatic Swollen glands?: No Easy bruising?: No  Cardiovascular Leg swelling?: No Chest pain?: No  Respiratory Cough?: No Shortness of breath?: No  Endocrine Excessive thirst?: No  Musculoskeletal Back pain?: No Joint pain?: No  Neurological Headaches?: No Dizziness?: No  Psychologic Depression?: No Anxiety?: No  Physical Exam: BP 138/87 (BP Location: Left Arm, Patient Position: Sitting, Cuff Size: Large)   Pulse 78   Ht 5' 4"  (1.626 m)   Wt 172 lb 3.2 oz (78.1 kg)   BMI 29.56 kg/m   Constitutional: Well nourished. Alert and oriented, No acute distress. HEENT: [place] AT, moist mucus membranes. Trachea midline, no masses. Cardiovascular: No clubbing, cyanosis, or edema. Respiratory: Normal respiratory effort, no increased work of breathing. GI: Abdomen is soft, non tender, non distended, no abdominal masses. Liver and spleen not palpable.  No hernias appreciated.  Stool sample for occult testing is not indicated.   GU: No CVA tenderness.  No bladder fullness or masses.  Normal external genitalia, normal pubic hair distribution, no lesions.  Normal urethral meatus, no lesions, no prolapse, no discharge.   No urethral masses, tenderness and/or tenderness. No bladder fullness, tenderness or masses. Normal vagina mucosa, good estrogen effect, no discharge, no lesions, poor pelvic support, grade II cystocele and no rectocele noted.  Cervix and uterus are surgically absent.  No adnexal/parametria masses or tenderness noted.  Anus and perineum are without rashes or lesions.    Skin: No rashes, bruises or suspicious lesions. Lymph: No cervical or inguinal adenopathy. Neurologic: Grossly intact, no focal deficits, moving all 4 extremities. Psychiatric: Normal mood and affect.  Laboratory Data: Lab Results  Component Value Date     WBC 6.0 04/17/2017   HGB 10.9 (L) 04/17/2017   HCT 34.1 (L) 04/17/2017   MCV 72.9 (L) 04/17/2017   PLT 336 04/17/2017    Lab Results  Component Value Date   CREATININE 0.84 04/17/2017   I have reviewed the labs.  Pertinent Imaging: Results for KAMALA, KOLTON (MRN [id number]) as of 11/25/2017 17:27  Ref. Range 11/20/2017 13:56  Scan Result Unknown 82m   Assessment & Plan:    1. History of nephrolithiasis Patient with a history of bilateral punctate calculi with no recent imaging  Complains of bilateral flank pain - will obtain a CT Renal Stone study RTC for results Patient is advised that if they should start to experience pain that is not able to be controlled with pain medication, intractable nausea and/or vomiting and/or fevers greater than 103 or shaking chills to contact the office immediately or seek treatment in the emergency department for emergent intervention.  2.  Cystitis Criteria for recurrent UTI has been NOT met with 2 or more infections in 6 months or 3 or greater infections in one year  Patient is instructed to increase their water intake until the urine is pale yellow or clear (10 to 12 cups daily)  Patient is instructed to take probiotics (yogurt, oral pills or vaginal suppositories), take cranberry pills or drink the juice and Vitamin C 1,000 mg daily to acidify the urine  Avoid soaking in tubs and wipe front to back after urinating  Benefit from core strengthening exercises has been seen.  We can refer to PT if they desire - her insurance does not cover PT Advised them to have CATH UA's for urinalysis and culture to prevent skin, vaginal and/or rectal contamination of the specimen Reviewed symptoms of UTI (fevers, chills, gross hematuria, mental status changes, dysuria, suprapubic pain, back pain and/or sudden worsening of urinary symptoms) and advised not to have urine checked or be treated for UTI if not experiencing symptoms                                                    Return for CT Renal stone report .  Zara Council, PA-C  [facility] [address] [place], [place] [address] [phone]

## 2017-11-21 LAB — URINALYSIS, COMPLETE
Bilirubin, UA: NEGATIVE
GLUCOSE, UA: NEGATIVE
Ketones, UA: NEGATIVE
LEUKOCYTES UA: NEGATIVE
Nitrite, UA: NEGATIVE
PH UA: 7 (ref 5.0–7.5)
Specific Gravity, UA: 1.02 (ref 1.005–1.030)
Urobilinogen, Ur: 0.2 mg/dL (ref 0.2–1.0)

## 2017-11-21 LAB — MICROSCOPIC EXAMINATION

## 2017-11-23 LAB — UA/M W/RFLX CULTURE, ROUTINE
BILIRUBIN UA: NEGATIVE
GLUCOSE, UA: NEGATIVE
KETONES UA: NEGATIVE
LEUKOCYTES UA: NEGATIVE
Nitrite, UA: NEGATIVE
Protein, UA: NEGATIVE
RBC, UA: NEGATIVE
SPEC GRAV UA: 1.019 (ref 1.005–1.030)
UUROB: 0.2 mg/dL (ref 0.2–1.0)
pH, UA: 6 (ref 5.0–7.5)

## 2017-11-23 LAB — MICROSCOPIC EXAMINATION
Casts: NONE SEEN /lpf
Epithelial Cells (non renal): >10 /hpf — AB (ref 0–10)
RBC MICROSCOPIC, UA: NONE SEEN /HPF (ref 0–2)

## 2017-11-23 LAB — URINE CULTURE, REFLEX

## 2017-11-28 DIAGNOSIS — F4310 Post-traumatic stress disorder, unspecified: Secondary | ICD-10-CM | POA: Insufficient documentation

## 2017-11-28 DIAGNOSIS — Z0001 Encounter for general adult medical examination with abnormal findings: Secondary | ICD-10-CM | POA: Insufficient documentation

## 2017-11-29 ENCOUNTER — Ambulatory Visit: Payer: Self-pay | Admitting: Nurse Practitioner

## 2017-12-03 ENCOUNTER — Ambulatory Visit (INDEPENDENT_AMBULATORY_CARE_PROVIDER_SITE_OTHER): Payer: Medicaid Other

## 2017-12-03 DIAGNOSIS — Z111 Encounter for screening for respiratory tuberculosis: Secondary | ICD-10-CM | POA: Diagnosis not present

## 2017-12-05 ENCOUNTER — Ambulatory Visit
Admission: RE | Admit: 2017-12-05 | Discharge: 2017-12-05 | Disposition: A | Discharge status: Home / Self Care | Payer: Medicaid Other | Source: Ambulatory Visit | Attending: Urology | Admitting: Urology

## 2017-12-05 DIAGNOSIS — N200 Calculus of kidney: Secondary | ICD-10-CM

## 2017-12-05 LAB — TB SKIN TEST
Induration: 0 mm
TB Skin Test: NEGATIVE

## 2017-12-11 ENCOUNTER — Ambulatory Visit (INDEPENDENT_AMBULATORY_CARE_PROVIDER_SITE_OTHER): Payer: Medicaid Other | Admitting: Urology

## 2017-12-11 ENCOUNTER — Encounter: Payer: Self-pay | Admitting: Urology

## 2017-12-11 ENCOUNTER — Telehealth: Payer: Self-pay | Admitting: Urology

## 2017-12-11 VITALS — BP 161/98 | HR 78 | Ht 64.0 in | Wt 171.6 lb

## 2017-12-11 DIAGNOSIS — N200 Calculus of kidney: Secondary | ICD-10-CM

## 2017-12-11 NOTE — Progress Notes (Signed)
12/11/2017 4:26 PM   Joanie L Sparr [birth year] [id number]  Referring provider: Carlean Jews, NP [address] [place], [place] [address]  No chief complaint on file.   HPI: 42 yo AAF with bilateral punctate renal calculi and rUTI's who presents today to discuss CT Renal stone study report.      Bilateral nephrolithiasis Bilateral punctate renal calculi seen on  05/2015 CT stone protocol. The stone burden is similar to her previous bilateral punctate stone burden on a CT scan 2013.  KUB on 01/15/2018 did not demonstrate any calculi.   CT Renal stone study revealed multiple 2-4 mm nonobstructive calculi in the collecting systems of both kidneys. No ureteral stones or findings of urinary tract  obstruction are noted at this time.  Small volume of free fluid in the low anatomic pelvis, presumably physiologic in this young female patient.  Today, she is having lower right back pain.  She does suffer from IBS, so she alternates with constipation and diarrhea.  She is not having any issues with urination at this time.   Patient denies any gross hematuria, dysuria or suprapubic/flank pain.  Patient denies any fevers, chills, nausea or vomiting.   PMH: Past Medical History:  Diagnosis Date  . Hypertension   . IBS (irritable bowel syndrome)   . Kidney stone     Surgical History: Past Surgical History:  Procedure Laterality Date  . CRYOABLATION  2010  . TUBAL LIGATION  2007  . VAGINAL HYSTERECTOMY  2013   Dr. Logan Bores [facility])    Home Medications:  Allergies as of 12/11/2017      Reactions   Sulfa Antibiotics Other (See Comments)   Mouth sores      Medication List        Accurate as of 12/11/17  4:26 PM. Always use your most recent med list.          citalopram 10 MG tablet Commonly known as:  CELEXA Take 10 mg by mouth. Take 2 tab daily   LINZESS 290 MCG Caps capsule Generic drug:  linaclotide Take 290 mcg by mouth daily before breakfast.   omeprazole 40 MG  capsule Commonly known as:  PRILOSEC Take 40 mg by mouth daily.   phenazopyridine 200 MG tablet Commonly known as:  PYRIDIUM Take 1 tablet (200 mg total) by mouth 3 (three) times daily as needed for pain.   tiZANidine 2 MG tablet Commonly known as:  ZANAFLEX Take 1 tablet (2 mg total) by mouth 2 (two) times daily as needed for muscle spasms.   traMADol 50 MG tablet Commonly known as:  ULTRAM Take by mouth every 6 (six) hours as needed.       Allergies:  Allergies  Allergen Reactions  . Sulfa Antibiotics Other (See Comments)    Mouth sores    Family History: Family History  Problem Relation Age of Onset  . Diabetes Mother   . Hypertension Mother   . Diabetes Father   . Hypertension Father   . Bladder Cancer Neg Hx   . Kidney cancer Neg Hx     Social History:  reports that she has never smoked. She has never used smokeless tobacco. She reports that she does not drink alcohol or use drugs.  ROS: UROLOGY Frequent Urination?: No Hard to postpone urination?: No Burning/pain with urination?: No Get up at night to urinate?: No Leakage of urine?: No Urine stream starts and stops?: No Trouble starting stream?: No Do you have to strain to urinate?: No  Blood in urine?: No Urinary tract infection?: Yes Sexually transmitted disease?: No Injury to kidneys or bladder?: No Painful intercourse?: No Weak stream?: No Currently pregnant?: No Vaginal bleeding?: No Last menstrual period?: n  Gastrointestinal Nausea?: No Vomiting?: No Indigestion/heartburn?: No Diarrhea?: Yes Constipation?: Yes  Constitutional Fever: No Night sweats?: No Weight loss?: No Fatigue?: No  Skin Skin rash/lesions?: No Itching?: No  Eyes Blurred vision?: No Double vision?: No  Ears/Nose/Throat Sore throat?: No Sinus problems?: No  Hematologic/Lymphatic Swollen glands?: No Easy bruising?: No  Cardiovascular Leg swelling?: No Chest pain?: No  Respiratory Cough?:  No Shortness of breath?: No  Endocrine Excessive thirst?: No  Musculoskeletal Back pain?: No Joint pain?: No  Neurological Headaches?: No Dizziness?: No  Psychologic Depression?: No Anxiety?: No  Physical Exam: BP (!) 161/98 (BP Location: Left Arm, Patient Position: Sitting, Cuff Size: Normal)   Pulse 78   Ht 5\' 4"  (1.626 m)   Wt 171 lb 9.6 oz (77.8 kg)   BMI 29.46 kg/m   Constitutional: Well nourished. Alert and oriented, No acute distress. HEENT: [place] AT, moist mucus membranes. Trachea midline, no masses. Cardiovascular: No clubbing, cyanosis, or edema. Respiratory: Normal respiratory effort, no increased work of breathing. Skin: No rashes, bruises or suspicious lesions. Lymph: No cervical or inguinal adenopathy. Neurologic: Grossly intact, no focal deficits, moving all 4 extremities. Psychiatric: Normal mood and affect.  Laboratory Data: Lab Results  Component Value Date   WBC 6.0 04/17/2017   HGB 10.9 (L) 04/17/2017   HCT 34.1 (L) 04/17/2017   MCV 72.9 (L) 04/17/2017   PLT 336 04/17/2017    Lab Results  Component Value Date   CREATININE 0.84 04/17/2017   I have reviewed the labs.  Pertinent Imaging: CLINICAL DATA:  42 year old female with history of urinary tract calculi. Intermittent abdominal pain.  EXAM: CT ABDOMEN AND PELVIS WITHOUT CONTRAST  TECHNIQUE: Multidetector CT imaging of the abdomen and pelvis was performed following the standard protocol without IV contrast.  COMPARISON:  CT the abdomen and pelvis 06/10/2015.  FINDINGS: Lower chest: Unremarkable.  Hepatobiliary: No cystic or solid hepatic lesions are confidently identified on today's noncontrast CT examination. Unenhanced appearance of the gallbladder is normal.  Pancreas: No definite pancreatic mass or peripancreatic fluid or inflammatory changes are noted on today's noncontrast CT examination.  Spleen: Unremarkable.  Adrenals/Urinary Tract: Multiple small  nonobstructive calculi are noted within the collecting systems of both kidneys measuring 2-4 mm in size. No additional calculi are noted along the course of either ureter or within the lumen of the urinary bladder. No hydroureteronephrosis. Unenhanced appearance of the kidneys and bilateral adrenal glands is otherwise unremarkable. Urinary bladder is normal in appearance.  Stomach/Bowel: Normal appearance of the stomach. No pathologic dilatation of small bowel or colon. Normal appendix.  Vascular/Lymphatic: No atherosclerotic calcifications noted in the abdominal aorta or pelvic vasculature. No lymphadenopathy noted in the abdomen or pelvis.  Reproductive: Status post hysterectomy. Ovaries are unremarkable in appearance.  Other: Small volume of free fluid in the low anatomic pelvis, presumably physiologic. No larger volume of ascites. No pneumoperitoneum.  Musculoskeletal: There are no aggressive appearing lytic or blastic lesions noted in the visualized portions of the skeleton.  IMPRESSION: 1. Multiple 2-4 mm nonobstructive calculi in the collecting systems of both kidneys. No ureteral stones or findings of urinary tract obstruction are noted at this time. 2. Small volume of free fluid in the low anatomic pelvis, presumably physiologic in this young female patient.   Electronically Signed   By:  Trudie Reed M.D.   On: 12/05/2017 13:01 I have independently reviewed the films  Assessment & Plan:    1. Bilateral nephrolithiasis Explained to the patient that the stones in theory should not be causing her back pain as they are not obstructing at this time Explained that ESWL or URS would not be effective in reducing stone burden and would not likely relieve her back pain as they are not obstructing I did suggest that she undergo a 24-hour metabolic work-up at this time as we do not know her stone composition and she has multiple bilateral stones Follow up pending  Litho link results Patient is advised that if they should start to experience pain that is not able to be controlled with pain medication, intractable nausea and/or vomiting and/or fevers greater than 103 or shaking chills to contact the office immediately or seek treatment in the emergency department for emergent intervention.                                     Return for pending Litholink resutls .  Michiel Cowboy, PA-C  [facility] [address] [place], [place] [address] [phone]

## 2017-12-11 NOTE — Patient Instructions (Signed)
Litholink Instructions [facility]  You will receive a box/kit in the mail that will have a urine jug and instructions in the kit.  When the box arrives you will need to call our office [phone] to schedule a LAB appointment.  You will need to do a 24hour urine and this should be done during the days that our office will be open.  For example any day from Sunday through Thursday.  How to collect the urine sample: On the day you start the urine sample this 1st morning urine should NOT be collected.  For the rest of the day including all night urines should be collected.  On the next morning the 1st urine should be collected and then you will be finished with the urine collections.  You will need to bring the box with you on your LAB appointment day after urine has been collected and all instructions are complete in the box.  Your blood will be drawn and the box will be collected by our Lab employee to be sent off for analysis.  When urine and blood is complete you will need to schedule a follow up appointment for lab results. 

## 2017-12-11 NOTE — Telephone Encounter (Signed)
Would you order a Litholink on this patient? 

## 2017-12-12 NOTE — Telephone Encounter (Signed)
Order placed

## 2017-12-27 ENCOUNTER — Other Ambulatory Visit: Payer: Medicaid Other

## 2018-01-02 ENCOUNTER — Other Ambulatory Visit: Payer: Self-pay | Admitting: Urology

## 2018-01-15 ENCOUNTER — Ambulatory Visit: Payer: Medicaid Other | Admitting: Urology

## 2018-01-16 ENCOUNTER — Encounter: Payer: Self-pay | Admitting: Urology

## 2018-01-16 ENCOUNTER — Ambulatory Visit: Payer: Medicaid Other | Admitting: Urology

## 2018-01-16 ENCOUNTER — Other Ambulatory Visit: Payer: Self-pay

## 2018-01-16 ENCOUNTER — Ambulatory Visit (INDEPENDENT_AMBULATORY_CARE_PROVIDER_SITE_OTHER): Payer: Medicaid Other | Admitting: Urology

## 2018-01-16 VITALS — BP 125/78 | HR 66 | Ht 64.0 in | Wt 175.3 lb

## 2018-01-16 DIAGNOSIS — R82992 Hyperoxaluria: Secondary | ICD-10-CM | POA: Diagnosis not present

## 2018-01-16 DIAGNOSIS — N200 Calculus of kidney: Secondary | ICD-10-CM

## 2018-01-16 NOTE — Progress Notes (Signed)
01/16/2018 9:22 AM   Ashley Mcpherson [birth year] [id number]  Referring provider: [facility] [address] [place], [place] [address]  Chief Complaint  Patient presents with  . Follow-up    HPI: 42 yo AAF with bilateral punctate renal calculi and rUTI's who presents today to discuss Litholink report.    Bilateral nephrolithiasis Bilateral punctate renal calculi seen on  05/2015 CT stone protocol. The stone burden is similar to her previous bilateral punctate stone burden on a CT scan 2013.  KUB on 01/15/2018 did not demonstrate any calculi.   CT Renal stone study revealed multiple 2-4 mm nonobstructive calculi in the collecting systems of both kidneys. No ureteral stones or findings of urinary tract obstruction are noted at this time.  Small volume of free fluid in the low anatomic pelvis, presumably physiologic in this young female patient.  Litholink report revealed sub optimal urine volume, borderline hyperoxaluria and high urine pH.    Today, she is feeling well.  She is not having any flank pain or urinary issues.  Patient denies any gross hematuria, dysuria or suprapubic/flank pain.  Patient denies any fevers, chills, nausea or vomiting.   PMH: Past Medical History:  Diagnosis Date  . Hypertension   . IBS (irritable bowel syndrome)   . Kidney stone     Surgical History: Past Surgical History:  Procedure Laterality Date  . CRYOABLATION  2010  . TUBAL LIGATION  2007  . VAGINAL HYSTERECTOMY  2013   Dr. Logan Bores [facility])    Home Medications:  Allergies as of 01/16/2018      Reactions   Sulfa Antibiotics Other (See Comments)   Mouth sores      Medication List        Accurate as of 01/16/18  9:22 AM. Always use your most recent med list.          citalopram 10 MG tablet Commonly known as:  CELEXA Take 10 mg by mouth. Take 2 tab daily   LINZESS 290 MCG Caps capsule Generic drug:  linaclotide Take 290 mcg by mouth daily before breakfast.     omeprazole 40 MG capsule Commonly known as:  PRILOSEC Take 40 mg by mouth daily.   phenazopyridine 200 MG tablet Commonly known as:  PYRIDIUM Take 1 tablet (200 mg total) by mouth 3 (three) times daily as needed for pain.   tiZANidine 2 MG tablet Commonly known as:  ZANAFLEX Take 1 tablet (2 mg total) by mouth 2 (two) times daily as needed for muscle spasms.   traMADol 50 MG tablet Commonly known as:  ULTRAM Take by mouth every 6 (six) hours as needed.       Allergies:  Allergies  Allergen Reactions  . Sulfa Antibiotics Other (See Comments)    Mouth sores    Family History: Family History  Problem Relation Age of Onset  . Diabetes Mother   . Hypertension Mother   . Diabetes Father   . Hypertension Father   . Bladder Cancer Neg Hx   . Kidney cancer Neg Hx     Social History:  reports that she has never smoked. She has never used smokeless tobacco. She reports that she does not drink alcohol or use drugs.  ROS: UROLOGY Frequent Urination?: No Hard to postpone urination?: No Burning/pain with urination?: No Get up at night to urinate?: No Leakage of urine?: No Urine stream starts and stops?: No Trouble starting stream?: No Do you have to strain to urinate?: No Blood in  urine?: No Urinary tract infection?: No Sexually transmitted disease?: No Injury to kidneys or bladder?: No Painful intercourse?: No Weak stream?: No Currently pregnant?: No Vaginal bleeding?: No  Gastrointestinal Nausea?: No Vomiting?: No Indigestion/heartburn?: No Diarrhea?: Yes Constipation?: Yes  Constitutional Fever: No Night sweats?: No Weight loss?: No Fatigue?: No  Skin Skin rash/lesions?: No Itching?: No  Eyes Blurred vision?: No Double vision?: No  Ears/Nose/Throat Sore throat?: No Sinus problems?: No  Hematologic/Lymphatic Swollen glands?: No Easy bruising?: No  Cardiovascular Leg swelling?: No Chest pain?: No  Respiratory Cough?: No Shortness of  breath?: No  Endocrine Excessive thirst?: No  Musculoskeletal Back pain?: No Joint pain?: No  Neurological Headaches?: No Dizziness?: No  Psychologic Depression?: No Anxiety?: No  Physical Exam: BP 125/78   Pulse 66   Ht 5\' 4"  (1.626 m)   Wt 175 lb 4.8 oz (79.5 kg)   BMI 30.09 kg/m   Constitutional: Well nourished. Alert and oriented, No acute distress. HEENT: [place] AT, moist mucus membranes. Trachea midline, no masses. Cardiovascular: No clubbing, cyanosis, or edema. Respiratory: Normal respiratory effort, no increased work of breathing. Skin: No rashes, bruises or suspicious lesions. Lymph: No cervical or inguinal adenopathy. Neurologic: Grossly intact, no focal deficits, moving all 4 extremities. Psychiatric: Normal mood and affect.  Laboratory Data: Lab Results  Component Value Date   WBC 6.0 04/17/2017   HGB 10.9 (L) 04/17/2017   HCT 34.1 (L) 04/17/2017   MCV 72.9 (L) 04/17/2017   PLT 336 04/17/2017    Lab Results  Component Value Date   CREATININE 0.84 04/17/2017   I have reviewed the labs.   Assessment & Plan:    1. Bilateral nephrolithiasis Explained to the patient that the stones in theory should not be causing her back pain as they are not obstructing at this time Explained that ESWL or URS would not be effective in reducing stone burden and would not likely relieve her back pain as they are not obstructing Patient is advised that if they should start to experience pain that is not able to be controlled with pain medication, intractable nausea and/or vomiting and/or fevers greater than 103 or shaking chills to contact the office immediately or seek treatment in the emergency department for emergent intervention.       2.  Suboptimal fluid intake Encourage patient to drink 2.5 L of water daily (10 to 12 cups) -she may do this gradually so that she may have better adherence in the future I have also given her the pamphlet on increasing fluid intake  provided by the [employer]        3.  Borderline hyperoxaluria Given the pamphlet on low oxalate diet provided by the [employer] and the ABCs of kidney stones prevention guide  3. High urine pH Will refer to nephrology to evaluate for renal tubular acidosis                          Return in about 3 months (around 04/18/2018) for repeat Litholink .  Michiel Cowboy, PA-C  [facility] [address] [place], [place] [address] [phone]  I spent 25 minutes with this patient in a face to face conversation of which greater than 50% was spent in counseling and coordination of care with the patient regarding her Litholink results.

## 2018-03-26 ENCOUNTER — Emergency Department
Admission: EM | Admit: 2018-03-26 | Discharge: 2018-03-26 | Disposition: A | Discharge status: Home / Self Care | Payer: Medicaid Other | Attending: Emergency Medicine | Admitting: Emergency Medicine

## 2018-03-26 ENCOUNTER — Other Ambulatory Visit: Payer: Self-pay

## 2018-03-26 ENCOUNTER — Encounter: Payer: Self-pay | Admitting: Medical Oncology

## 2018-03-26 ENCOUNTER — Emergency Department: Payer: Medicaid Other

## 2018-03-26 DIAGNOSIS — R1030 Lower abdominal pain, unspecified: Secondary | ICD-10-CM | POA: Diagnosis present

## 2018-03-26 DIAGNOSIS — Z79899 Other long term (current) drug therapy: Secondary | ICD-10-CM | POA: Insufficient documentation

## 2018-03-26 DIAGNOSIS — I10 Essential (primary) hypertension: Secondary | ICD-10-CM | POA: Diagnosis not present

## 2018-03-26 DIAGNOSIS — R109 Unspecified abdominal pain: Secondary | ICD-10-CM

## 2018-03-26 DIAGNOSIS — N200 Calculus of kidney: Secondary | ICD-10-CM | POA: Diagnosis not present

## 2018-03-26 LAB — COMPREHENSIVE METABOLIC PANEL
ALK PHOS: 42 U/L (ref 38–126)
ALT: 15 U/L (ref 0–44)
ANION GAP: 6 (ref 5–15)
AST: 16 U/L (ref 15–41)
Albumin: 3.9 g/dL (ref 3.5–5.0)
BUN: 14 mg/dL (ref 6–20)
CALCIUM: 8.6 mg/dL — AB (ref 8.9–10.3)
CO2: 24 mmol/L (ref 22–32)
CREATININE: 0.76 mg/dL (ref 0.44–1.00)
Chloride: 106 mmol/L (ref 98–111)
Glucose, Bld: 92 mg/dL (ref 70–99)
Potassium: 3.9 mmol/L (ref 3.5–5.1)
Sodium: 136 mmol/L (ref 135–145)
TOTAL PROTEIN: 7.6 g/dL (ref 6.5–8.1)
Total Bilirubin: 1.1 mg/dL (ref 0.3–1.2)

## 2018-03-26 LAB — URINALYSIS, COMPLETE (UACMP) WITH MICROSCOPIC
Bacteria, UA: NONE SEEN
Bilirubin Urine: NEGATIVE
Glucose, UA: NEGATIVE mg/dL
Ketones, ur: NEGATIVE mg/dL
Leukocytes, UA: NEGATIVE
Nitrite: NEGATIVE
PH: 6 (ref 5.0–8.0)
Protein, ur: NEGATIVE mg/dL
SPECIFIC GRAVITY, URINE: 1.021 (ref 1.005–1.030)

## 2018-03-26 LAB — CBC
HEMATOCRIT: 35 % — AB (ref 36.0–46.0)
HEMOGLOBIN: 10.8 g/dL — AB (ref 12.0–15.0)
MCH: 23.6 pg — AB (ref 26.0–34.0)
MCHC: 30.9 g/dL (ref 30.0–36.0)
MCV: 76.4 fL — AB (ref 80.0–100.0)
NRBC: 0 % (ref 0.0–0.2)
Platelets: 305 10*3/uL (ref 150–400)
RBC: 4.58 MIL/uL (ref 3.87–5.11)
RDW: 15.3 % (ref 11.5–15.5)
WBC: 5.9 10*3/uL (ref 4.0–10.5)

## 2018-03-26 LAB — LIPASE, BLOOD: LIPASE: 25 U/L (ref 11–51)

## 2018-03-26 NOTE — ED Provider Notes (Signed)
[facility] Emergency Department Provider Note  Time seen: 9:09 AM  I have reviewed the triage vital signs and the nursing notes.   HISTORY  Chief Complaint Abdominal Pain and Flank Pain   HPI Daveda L Charyl DancerGant is a 43 y.o. female with a past medical history of kidney stones, IBS, hypertension, presents to the emergency department for lower abdominal discomfort.  According to the patient over the past 2 days she has been experiencing intermittent lower abdominal discomfort, severe at times per patient.  Patient states mild urinary discomfort which she says feels like a possible urinary tract infection.  She also has a history of kidney stones and states this feels like it could be a kidney stone 2 although denies any noted hematuria.  No vaginal bleeding or discharge, status post hysterectomy.  No upper abdominal discomfort, nausea vomiting or diarrhea.  States a history of IBS.  Largely negative review of systems otherwise.  States mild pain currently 4/10 dull aching pain across the lower abdomen.   Past Medical History:  Diagnosis Date  . Hypertension   . IBS (irritable bowel syndrome)   . Kidney stone     Patient Active Problem List   Diagnosis Date Noted  . Encounter for general adult medical examination with abnormal findings 11/28/2017  . Post traumatic stress disorder 11/28/2017  . Urinary tract infection with hematuria 08/12/2017  . Dysuria 08/12/2017  . Chronic bilateral low back pain 08/12/2017  . Essential hypertension 04/17/2017    Past Surgical History:  Procedure Laterality Date  . CRYOABLATION  2010  . TUBAL LIGATION  2007  . [facility]AGINAL [facility]  2013   [facility]    Prior to Admission medications   Medication Sig Start Date End Date Taking? Authorizing Provider  citalopram (CELEXA) 10 MG tablet Take 10 mg by mouth. Take 2 tab daily    [provider]  linaclotide (LINZESS) 290 MCG CAPS capsule Take 290 mcg by mouth  daily before breakfast.    [provider]  omeprazole (PRILOSEC) 40 MG capsule Take 40 mg by mouth daily.    [provider]  phenazopyridine (PYRIDIUM) 200 MG tablet Take 1 tablet (200 mg total) by mouth 3 (three) times daily as needed for pain. Patient not taking: Reported on 11/01/2017 07/19/17   Carlean JewsBoscia, Heather E, NP  tiZANidine (ZANAFLEX) 2 MG tablet Take 1 tablet (2 mg total) by mouth 2 (two) times daily as needed for muscle spasms. 04/17/17   Carlean JewsBoscia, Heather E, NP  traMADol (ULTRAM) 50 MG tablet Take by mouth every 6 (six) hours as needed.    [provider]    Allergies  Allergen Reactions  . Sulfa Antibiotics Other (See Comments)    Mouth sores    Family History  Problem Relation Age of Onset  . Diabetes Mother   . Hypertension Mother   . Diabetes Father   . Hypertension Father   . Bladder Cancer Neg Hx   . Kidney cancer Neg Hx     Social History Social History   Tobacco Use  . Smoking status: Never Smoker  . Smokeless tobacco: Never Used  Substance Use Topics  . Alcohol use: No  . Drug use: No    Review of Systems Constitutional: Negative for fever. Cardiovascular: Negative for chest pain. Respiratory: Negative for shortness of breath. Gastrointestinal: Moderate dull pain across lower abdomen.  Negative for nausea vomiting or diarrhea Genitourinary: Mild dysuria.  Positive for flank pain and lower abdominal pain  Musculoskeletal: Negative for musculoskeletal complaints Skin: Negative for skin complaints  Neurological: Negative for headache All other ROS negative  ____________________________________________   PHYSICAL EXAM:  VITAL SIGNS: ED Triage Vitals  Enc Vitals Group     BP 03/26/18 0842 (!) 191/106     Pulse Rate 03/26/18 0842 81     Resp 03/26/18 0842 18     Temp 03/26/18 0842 98 F (36.7 C)     Temp Source 03/26/18 0842 Oral     SpO2 03/26/18 0842 100 %     Weight 03/26/18 0843 167 lb (75.8 kg)     Height 03/26/18  0843 5\' 4"  (1.626 m)     Head Circumference --      Peak Flow --      Pain Score 03/26/18 0842 5     Pain Loc --      Pain Edu? --      Excl. in GC? --     Constitutional: Alert and oriented. Well appearing and in no distress. Eyes: Normal exam ENT   Head: Normocephalic and atraumatic.   Mouth/Throat: Mucous membranes are moist. Cardiovascular: Normal rate, regular rhythm. No murmur Respiratory: Normal respiratory effort without tachypnea nor retractions. Breath sounds are clear Gastrointestinal: Soft, mild lower abdominal tenderness palpation.  No rebound guarding or distention.  No CVA tenderness. Musculoskeletal: Nontender with normal range of motion in all extremities.  Neurologic:  Normal speech and language. No gross focal neurologic deficits  Skin:  Skin is warm, dry and intact.  Psychiatric: Mood and affect are normal.   ____________________________________________     RADIOLOGY  CT scan shows bilateral small renal stones but no visible ureteral stones.  ____________________________________________   INITIAL IMPRESSION / ASSESSMENT AND PLAN / ED COURSE  Pertinent labs & imaging results that were available during my care of the patient were reviewed by me and considered in my medical decision making (see chart for details).  Patient presents to the emergency department with complaints of mild dysuria lower abdominal discomfort radiating to bilateral flanks.  No CVA tenderness.  No upper abdominal tenderness.  Differential this time could include urinary tract infection, pyelonephritis, ureterolithiasis, colitis or diverticulitis.  We will check labs, urinalysis, obtain CT renal scan to further evaluate and continue to closely monitor.   CT scan shows renal stones otherwise largely nonrevealing.  Lab work shows a small amount of blood in her urinalysis, but no white cells or bacteria.  Urine culture has been sent.  Patient states he is feeling better.  She may have  passed a small stone.  We will discharge the patient with PCP follow-up.  Patient agreeable to plan of care.  ____________________________________________   FINAL CLINICAL IMPRESSION(S) / ED DIAGNOSES  Lower abdominal discomfort    Minna Antis, MD 03/26/18 1128

## 2018-03-26 NOTE — ED Triage Notes (Signed)
Pt reports over the weekend she began having pain to left flank and down into lower abd. Pt reports hx of kidney stones and this feels same.

## 2018-03-26 NOTE — ED Notes (Signed)
Patient transported to CT 

## 2018-04-18 ENCOUNTER — Ambulatory Visit: Payer: Medicaid Other | Admitting: Urology

## 2018-04-19 ENCOUNTER — Telehealth: Payer: Self-pay | Admitting: Urology

## 2018-04-19 NOTE — Telephone Encounter (Signed)
Would you order a Litholink for Ashley Mcpherson?  She has an upcoming appointment on the 21st and we need to have it completed by then so that we can go over her results?

## 2018-04-19 NOTE — Telephone Encounter (Signed)
Form filled out and faxed 

## 2018-05-03 ENCOUNTER — Ambulatory Visit: Payer: Medicaid Other | Admitting: Urology

## 2018-05-20 ENCOUNTER — Ambulatory Visit (INDEPENDENT_AMBULATORY_CARE_PROVIDER_SITE_OTHER): Payer: Medicaid Other | Admitting: Nurse Practitioner

## 2018-05-20 ENCOUNTER — Encounter: Payer: Self-pay | Admitting: Nurse Practitioner

## 2018-05-20 ENCOUNTER — Other Ambulatory Visit: Payer: Self-pay

## 2018-05-20 VITALS — BP 146/97 | HR 80 | Resp 16 | Ht 64.0 in | Wt 168.0 lb

## 2018-05-20 DIAGNOSIS — R300 Dysuria: Secondary | ICD-10-CM | POA: Diagnosis not present

## 2018-05-20 DIAGNOSIS — I10 Essential (primary) hypertension: Secondary | ICD-10-CM

## 2018-05-20 DIAGNOSIS — R109 Unspecified abdominal pain: Secondary | ICD-10-CM | POA: Diagnosis not present

## 2018-05-20 DIAGNOSIS — N200 Calculus of kidney: Secondary | ICD-10-CM | POA: Diagnosis not present

## 2018-05-20 LAB — POCT URINALYSIS DIPSTICK
Bilirubin, UA: NEGATIVE
Blood, UA: NEGATIVE
GLUCOSE UA: NEGATIVE
Ketones, UA: NEGATIVE
LEUKOCYTES UA: NEGATIVE
Nitrite, UA: NEGATIVE
Protein, UA: POSITIVE — AB
SPEC GRAV UA: 1.01 (ref 1.010–1.025)
Urobilinogen, UA: 0.2 E.U./dL
pH, UA: 7.5 (ref 5.0–8.0)

## 2018-05-20 MED ORDER — HYDROCODONE-ACETAMINOPHEN 5-325 MG PO TABS: 1.0000 | ORAL_TABLET | Freq: Three times a day (TID) | ORAL | 0 refills | Status: DC | PRN

## 2018-05-20 NOTE — Progress Notes (Signed)
[facility] [address] [place], [place] [address]  Internal MEDICINE  Office Visit Note  Patient Name: Ashley Mcpherson  [id number]  [id number]  Date of Service: 06/02/2018  Chief Complaint  Patient presents with  . Medical Management of Chronic Issues    6 month routine follow up  . Hypertension    The patient has a history of kidney stones, for which she has seen urology, but is awaiting first appointment with new urologist as prior urologist has moved to new area. She has not passed any stone.  Today she is reporting bilateral flank pain that radiated around her side, and into her groin/lower abdoman. She was seen in the ER in 03/2018 and continues to show bilateral renal calculi. She rates her pain at approximately 8/10 in severity. Blood pressure elevated some, likely due to pain.       Current Medication: Outpatient Encounter Medications as of 05/20/2018  Medication Sig  . citalopram (CELEXA) 10 MG tablet Take 10 mg by mouth. Take 2 tab daily  . linaclotide (LINZESS) 290 MCG CAPS capsule Take 290 mcg by mouth daily before breakfast.  . omeprazole (PRILOSEC) 40 MG capsule Take 40 mg by mouth daily.  Marland Kitchen tiZANidine (ZANAFLEX) 2 MG tablet Take 1 tablet (2 mg total) by mouth 2 (two) times daily as needed for muscle spasms.  . [DISCONTINUED] traMADol (ULTRAM) 50 MG tablet Take by mouth every 6 (six) hours as needed.  Marland Kitchen HYDROcodone-acetaminophen (NORCO/VICODIN) 5-325 MG tablet Take 1 tablet by mouth 3 (three) times daily as needed for moderate pain.  . [DISCONTINUED] phenazopyridine (PYRIDIUM) 200 MG tablet Take 1 tablet (200 mg total) by mouth 3 (three) times daily as needed for pain. (Patient not taking: Reported on 11/01/2017)   No facility-administered encounter medications on file as of 05/20/2018.     Surgical History: Past Surgical History:  Procedure Laterality Date  . CRYOABLATION  2010  . TUBAL LIGATION  2007  . VAGINAL HYSTERECTOMY  2013   Dr. Logan Bores [facility])     Medical History: Past Medical History:  Diagnosis Date  . Hypertension   . IBS (irritable bowel syndrome)   . Kidney stone     Family History: Family History  Problem Relation Age of Onset  . Diabetes Mother   . Hypertension Mother   . Diabetes Father   . Hypertension Father   . Bladder Cancer Neg Hx   . Kidney cancer Neg Hx     Social History   Socioeconomic History  . Marital status: Single    Spouse name: Not on file  . Number of children: Not on file  . Years of education: Not on file  . Highest education level: Not on file  Occupational History  . Not on file  Social Needs  . Financial resource strain: Not on file  . Food insecurity:    Worry: Not on file    Inability: Not on file  . Transportation needs:    Medical: Not on file    Non-medical: Not on file  Tobacco Use  . Smoking status: Never Smoker  . Smokeless tobacco: Never Used  Substance and Sexual Activity  . Alcohol use: No  . Drug use: No  . Sexual activity: Yes    Birth control/protection: Surgical  Lifestyle  . Physical activity:    Days per week: Not on file    Minutes per session: Not on file  . Stress: Not on file  Relationships  . Social connections:  Talks on phone: Not on file    Gets together: Not on file    Attends religious service: Not on file    Active member of club or organization: Not on file    Attends meetings of clubs or organizations: Not on file    Relationship status: Not on file  . Intimate partner violence:    Fear of current or ex partner: Not on file    Emotionally abused: Not on file    Physically abused: Not on file    Forced sexual activity: Not on file  Other Topics Concern  . Not on file  Social History Narrative  . Not on file      Review of Systems  Constitutional: Positive for fatigue. Negative for chills and unexpected weight change.  HENT: Negative for congestion, rhinorrhea, sneezing and sore throat.   Eyes: Negative for photophobia,  pain and redness.  Respiratory: Negative for cough, chest tightness, shortness of breath and wheezing.   Cardiovascular: Negative for chest pain and palpitations.  Gastrointestinal: Negative for abdominal pain, constipation, diarrhea, nausea and vomiting.  Endocrine: Negative for cold intolerance, heat intolerance, polydipsia and polyuria.  Genitourinary: Positive for dysuria, flank pain, hematuria and pelvic pain. Negative for frequency.  Musculoskeletal: Positive for myalgias. Negative for arthralgias, back pain, joint swelling and neck pain.  Skin: Negative for rash.  Allergic/Immunologic: Negative for environmental allergies.  Neurological: Negative for tremors and numbness.  Hematological: Negative for adenopathy. Does not bruise/bleed easily.  Psychiatric/Behavioral: Negative for behavioral problems and sleep disturbance. The patient is not nervous/anxious.     Today's Vitals   05/20/18 1526  BP: (!) 146/97  Pulse: 80  Resp: 16  SpO2: 100%  Weight: 168 lb (76.2 kg)  Height: 5\' 4"  (1.626 m)   Body mass index is 28.84 kg/m.  Physical Exam Vitals signs and nursing note reviewed.  Constitutional:      General: She is not in acute distress.    Appearance: Normal appearance. She is well-developed. She is not diaphoretic.  HENT:     Head: Normocephalic and atraumatic.     Mouth/Throat:     Pharynx: No oropharyngeal exudate.  Eyes:     Conjunctiva/sclera: Conjunctivae normal.     Pupils: Pupils are equal, round, and reactive to light.  Neck:     Musculoskeletal: Normal range of motion and neck supple.     Thyroid: No thyromegaly.     Vascular: No JVD.     Trachea: No tracheal deviation.  Cardiovascular:     Rate and Rhythm: Normal rate and regular rhythm.     Heart sounds: Normal heart sounds. No murmur. No friction rub. No gallop.   Pulmonary:     Effort: Pulmonary effort is normal. No respiratory distress.     Breath sounds: Normal breath sounds. No wheezing or rales.   Chest:     Chest wall: No tenderness.  Abdominal:     Palpations: Abdomen is soft.     Tenderness: There is abdominal tenderness. There is no guarding.  Genitourinary:    Comments: Bilateral flank pain with palpation. There is protein present in urine sample today.  Musculoskeletal: Normal range of motion.  Lymphadenopathy:     Cervical: No cervical adenopathy.  Skin:    General: Skin is warm and dry.  Neurological:     Mental Status: She is alert and oriented to person, place, and time.     Cranial Nerves: No cranial nerve deficit.  Psychiatric:  Behavior: Behavior normal.        Thought Content: Thought content normal.        Judgment: Judgment normal.   Assessment/Plan: 1. Flank pain Reviewed recent ER visit notes, showing bilateral renal calculi. Gave very short term prescription for hydrocodone/APAP 5/325 up t three times daily as needed for moderate to severe pain. Prescription for #15 tablets sent to pharmacy.  - HYDROcodone-acetaminophen (NORCO/VICODIN) 5-325 MG tablet; Take 1 tablet by mouth 3 (three) times daily as needed for moderate pain.  Dispense: 15 tablet; Refill: 0  2. Renal calculi Reviewed recent ER visit notes, showing bilateral renal calculi. Gave very short term prescription for hydrocodone/APAP 5/325 up t three times daily as needed for moderate to severe pain. Prescription for #15 tablets sent to pharmacy.   - HYDROcodone-acetaminophen (NORCO/VICODIN) 5-325 MG tablet; Take 1 tablet by mouth 3 (three) times daily as needed for moderate pain.  Dispense: 15 tablet; Refill: 0  3. Dysuria U/a positive for protein only today. - POCT Urinalysis Dipstick  4. Essential hypertension bp elevated today, likely due to pain. Continue bp medication as prescribed   General Counseling: Kinsie verbalizes understanding of the findings of todays visit and agrees with plan of treatment. I have discussed any further diagnostic evaluation that may be needed or ordered  today. We also reviewed her medications today. she has been encouraged to call the office with any questions or concerns that should arise related to todays visit.  Hypertension Counseling:   The following hypertensive lifestyle modification were recommended and discussed:  1. Limiting alcohol intake to less than 1 oz/day of ethanol:(24 oz of beer or 8 oz of wine or 2 oz of 100-proof whiskey). 2. Take baby ASA 81 mg daily. 3. Importance of regular aerobic exercise and losing weight. 4. Reduce dietary saturated fat and cholesterol intake for overall cardiovascular health. 5. Maintaining adequate dietary potassium, calcium, and magnesium intake. 6. Regular monitoring of the blood pressure. 7. Reduce sodium intake to less than 100 mmol/day (less than 2.3 gm of sodium or less than 6 gm of sodium choride)   This patient was seen by Vincent Gros FNP Collaboration with Dr Lyndon Code as a part of collaborative care agreement  Orders Placed This Encounter  Procedures  . POCT Urinalysis Dipstick    Meds ordered this encounter  Medications  . HYDROcodone-acetaminophen (NORCO/VICODIN) 5-325 MG tablet    Sig: Take 1 tablet by mouth 3 (three) times daily as needed for moderate pain.    Dispense:  15 tablet    Refill:  0    Order Specific Question:   Supervising Provider    Answer:   Lyndon Code [[id number]]    Time spent: 85 Minutes      Dr Lyndon Code Internal medicine

## 2018-05-20 NOTE — Progress Notes (Signed)
Pt blood pressure elevated, informed provider. 

## 2018-06-02 DIAGNOSIS — N200 Calculus of kidney: Secondary | ICD-10-CM | POA: Insufficient documentation

## 2018-06-02 DIAGNOSIS — R109 Unspecified abdominal pain: Secondary | ICD-10-CM | POA: Insufficient documentation

## 2018-07-05 ENCOUNTER — Encounter: Payer: Self-pay | Admitting: Nurse Practitioner

## 2018-07-05 ENCOUNTER — Other Ambulatory Visit: Payer: Self-pay

## 2018-07-05 ENCOUNTER — Ambulatory Visit: Payer: Medicaid Other | Admitting: Nurse Practitioner

## 2018-07-05 VITALS — BP 156/94 | HR 78 | Resp 16 | Ht 64.0 in | Wt 171.2 lb

## 2018-07-05 DIAGNOSIS — I10 Essential (primary) hypertension: Secondary | ICD-10-CM | POA: Diagnosis not present

## 2018-07-05 DIAGNOSIS — N200 Calculus of kidney: Secondary | ICD-10-CM | POA: Diagnosis not present

## 2018-07-05 DIAGNOSIS — R109 Unspecified abdominal pain: Secondary | ICD-10-CM | POA: Diagnosis not present

## 2018-07-05 MED ORDER — HYDROCODONE-ACETAMINOPHEN 5-325 MG PO TABS: 1.0000 | ORAL_TABLET | Freq: Three times a day (TID) | ORAL | 0 refills | Status: DC | PRN

## 2018-07-05 MED ORDER — NAPROXEN SODIUM 550 MG PO TABS: 550.0000 mg | ORAL_TABLET | Freq: Two times a day (BID) | ORAL | 2 refills | Status: DC

## 2018-07-05 MED ORDER — LOSARTAN POTASSIUM 25 MG PO TABS: 25.0000 mg | ORAL_TABLET | Freq: Every day | ORAL | 3 refills | Status: DC

## 2018-07-05 NOTE — Progress Notes (Signed)
[facility] [address] [place], [place] [address]  Internal MEDICINE  Office Visit Note  Patient Name: Ashley Mcpherson  [id number]  [id number]  Date of Service: 07/27/2018  Chief Complaint  Patient presents with  . Medical Management of Chronic Issues    6wk follow up recheck blood pressure,   . Hypertension    Patient here for follow up visit. Blood pressure continues to be elevated. Has not started blood pressure medication. Feels as though pain may be making blood pressure higher. She has bilateral flank pain which radiates around to the belly. She has diagnosed renal stones. Most recent CT scan done 1+2020 showed multiple stones in both kidneys. She states that she has been unable to see urologist yet. Appointment keeps getting pushed back because of COVID 19.       Current Medication: Outpatient Encounter Medications as of 07/05/2018  Medication Sig  . citalopram (CELEXA) 10 MG tablet Take 10 mg by mouth. Take 2 tab daily  . HYDROcodone-acetaminophen (NORCO/VICODIN) 5-325 MG tablet Take 1 tablet by mouth 3 (three) times daily as needed for moderate pain.  Marland Kitchen linaclotide (LINZESS) 290 MCG CAPS capsule Take 290 mcg by mouth daily before breakfast.  . omeprazole (PRILOSEC) 40 MG capsule Take 40 mg by mouth daily.  Marland Kitchen tiZANidine (ZANAFLEX) 2 MG tablet Take 1 tablet (2 mg total) by mouth 2 (two) times daily as needed for muscle spasms.  . [DISCONTINUED] HYDROcodone-acetaminophen (NORCO/VICODIN) 5-325 MG tablet Take 1 tablet by mouth 3 (three) times daily as needed for moderate pain.  . naproxen sodium (ANAPROX DS) 550 MG tablet Take 1 tablet (550 mg total) by mouth 2 (two) times daily with a meal.  . [DISCONTINUED] losartan (COZAAR) 25 MG tablet Take 1 tablet (25 mg total) by mouth daily.   No facility-administered encounter medications on file as of 07/05/2018.     Surgical History: Past Surgical History:  Procedure Laterality Date  . CRYOABLATION  2010  . TUBAL  LIGATION  2007  . VAGINAL HYSTERECTOMY  2013   Dr. Logan Bores [facility])    Medical History: Past Medical History:  Diagnosis Date  . Hypertension   . IBS (irritable bowel syndrome)   . Kidney stone     Family History: Family History  Problem Relation Age of Onset  . Diabetes Mother   . Hypertension Mother   . Diabetes Father   . Hypertension Father   . Bladder Cancer Neg Hx   . Kidney cancer Neg Hx     Social History   Socioeconomic History  . Marital status: Single    Spouse name: Not on file  . Number of children: Not on file  . Years of education: Not on file  . Highest education level: Not on file  Occupational History  . Not on file  Social Needs  . Financial resource strain: Not on file  . Food insecurity:    Worry: Not on file    Inability: Not on file  . Transportation needs:    Medical: Not on file    Non-medical: Not on file  Tobacco Use  . Smoking status: Never Smoker  . Smokeless tobacco: Never Used  Substance and Sexual Activity  . Alcohol use: No  . Drug use: No  . Sexual activity: Yes    Birth control/protection: Surgical  Lifestyle  . Physical activity:    Days per week: Not on file    Minutes per session: Not on file  . Stress: Not on  file  Relationships  . Social connections:    Talks on phone: Not on file    Gets together: Not on file    Attends religious service: Not on file    Active member of club or organization: Not on file    Attends meetings of clubs or organizations: Not on file    Relationship status: Not on file  . Intimate partner violence:    Fear of current or ex partner: Not on file    Emotionally abused: Not on file    Physically abused: Not on file    Forced sexual activity: Not on file  Other Topics Concern  . Not on file  Social History Narrative  . Not on file      Review of Systems  Constitutional: Positive for fatigue. Negative for chills and unexpected weight change.  HENT: Negative for congestion,  rhinorrhea, sneezing and sore throat.   Respiratory: Negative for cough, chest tightness, shortness of breath and wheezing.   Cardiovascular: Negative for chest pain and palpitations.       Elevated blood pressure   Gastrointestinal: Negative for abdominal pain, constipation, diarrhea, nausea and vomiting.  Endocrine: Negative for cold intolerance, heat intolerance, polydipsia and polyuria.  Genitourinary: Positive for dysuria, flank pain, hematuria and pelvic pain. Negative for frequency.  Musculoskeletal: Positive for back pain and myalgias. Negative for arthralgias, joint swelling and neck pain.  Skin: Negative for rash.  Allergic/Immunologic: Negative for environmental allergies.  Neurological: Negative for tremors and numbness.  Hematological: Negative for adenopathy. Does not bruise/bleed easily.  Psychiatric/Behavioral: Negative for behavioral problems and sleep disturbance. The patient is not nervous/anxious.     Today's Vitals   07/05/18 1537  BP: (!) 156/94  Pulse: 78  Resp: 16  SpO2: 99%  Weight: 171 lb 3.2 oz (77.7 kg)  Height: 5\' 4"  (1.626 m)   Body mass index is 29.39 kg/m.  Physical Exam Vitals signs and nursing note reviewed.  Constitutional:      General: She is not in acute distress.    Appearance: Normal appearance. She is well-developed. She is not diaphoretic.  HENT:     Head: Normocephalic and atraumatic.     Mouth/Throat:     Pharynx: No oropharyngeal exudate.  Eyes:     Conjunctiva/sclera: Conjunctivae normal.     Pupils: Pupils are equal, round, and reactive to light.  Neck:     Musculoskeletal: Normal range of motion and neck supple.     Thyroid: No thyromegaly.     Vascular: No JVD.     Trachea: No tracheal deviation.  Cardiovascular:     Rate and Rhythm: Normal rate and regular rhythm.     Heart sounds: Normal heart sounds. No murmur. No friction rub. No gallop.   Pulmonary:     Effort: Pulmonary effort is normal. No respiratory distress.      Breath sounds: Normal breath sounds. No wheezing or rales.  Chest:     Chest wall: No tenderness.  Abdominal:     Palpations: Abdomen is soft.     Tenderness: There is abdominal tenderness. There is right CVA tenderness and left CVA tenderness. There is no guarding.  Musculoskeletal: Normal range of motion.  Lymphadenopathy:     Cervical: No cervical adenopathy.  Skin:    General: Skin is warm and dry.  Neurological:     Mental Status: She is alert and oriented to person, place, and time.     Cranial Nerves: No cranial nerve deficit.  Psychiatric:        Behavior: Behavior normal.        Thought Content: Thought content normal.        Judgment: Judgment normal.    Assessment/Plan:  1. Essential hypertension Restart losartan  daily. Diet and lifestyle changes needed to help lower blood presssure.   2. Flank pain Related to bilatera lrenal calculi. May take naproxen  bid as needed for pain and inflammation. May take hydrocodone/APAP 5/325mg  tablets up to three times daily if needed for severe pain.  - naproxen sodium (ANAPROX DS) 550 MG tablet; Take 1 tablet (550 mg total) by mouth 2 (two) times daily with a meal.  Dispense: 45 tablet; Refill: 2 - HYDROcodone-acetaminophen (NORCO/VICODIN) 5-325 MG tablet; Take 1 tablet by mouth 3 (three) times daily as needed for moderate pain.  Dispense: 15 tablet; Refill: 0  3. Renal calculi Advised she follow up with urology as soon as appointment is available.  - naproxen sodium (ANAPROX DS) 550 MG tablet; Take 1 tablet (550 mg total) by mouth 2 (two) times daily with a meal.  Dispense: 45 tablet; Refill: 2 - HYDROcodone-acetaminophen (NORCO/VICODIN) 5-325 MG tablet; Take 1 tablet by mouth 3 (three) times daily as needed for moderate pain.  Dispense: 15 tablet; Refill: 0   General Counseling: Atara verbalizes understanding of the findings of todays visit and agrees with plan of treatment. I have discussed any further diagnostic  evaluation that may be needed or ordered today. We also reviewed her medications today. she has been encouraged to call the office with any questions or concerns that should arise related to todays visit.  Reviewed risks and possible side effects associated with taking opiates, benzodiazepines and other CNS depressants. Combination of these could cause dizziness and drowsiness. Advised patient not to drive or operate machinery when taking these medications, as patient's and other's life can be at risk and will have consequences. Patient verbalized understanding in this matter. Dependence and abuse for these drugs will be monitored closely. A Controlled substance policy and procedure is on file which allows [facility] to order a urine drug screen test at any visit. Patient understands and agrees with the plan  Hypertension Counseling:   The following hypertensive lifestyle modification were recommended and discussed:  1. Limiting alcohol intake to less than 1 oz/day of ethanol:(24 oz of beer or 8 oz of wine or 2 oz of 100-proof whiskey). 2. Take baby ASA 81 mg daily. 3. Importance of regular aerobic exercise and losing weight. 4. Reduce dietary saturated fat and cholesterol intake for overall cardiovascular health. 5. Maintaining adequate dietary potassium, calcium, and magnesium intake. 6. Regular monitoring of the blood pressure. 7. Reduce sodium intake to less than 100 mmol/day (less than 2.3 gm of sodium or less than 6 gm of sodium choride)   This patient was seen by Vincent Gros FNP Collaboration with Dr Lyndon Code as a part of collaborative care agreement  Meds ordered this encounter  Medications  . naproxen sodium (ANAPROX DS) 550 MG tablet    Sig: Take 1 tablet (550 mg total) by mouth 2 (two) times daily with a meal.    Dispense:  45 tablet    Refill:  2    Order Specific Question:   Supervising Provider    Answer:   Lyndon Code [[id number]]  . DISCONTD: losartan (COZAAR)  25 MG tablet    Sig: Take 1 tablet (25 mg total) by mouth daily.    Dispense:  30 tablet    Refill:  3    Order Specific Question:   Supervising Provider    Answer:   Lyndon Code [[id number]]  . HYDROcodone-acetaminophen (NORCO/VICODIN) 5-325 MG tablet    Sig: Take 1 tablet by mouth 3 (three) times daily as needed for moderate pain.    Dispense:  15 tablet    Refill:  0    Order Specific Question:   Supervising Provider    Answer:   Lyndon Code [[id number]]    Time spent: 73 Minutes      Dr Lyndon Code Internal medicine

## 2018-07-05 NOTE — Progress Notes (Signed)
Pt blood pressure elevated, 1st reading 151/92 2nd reading 156/94 Informed provider.

## 2018-07-22 ENCOUNTER — Other Ambulatory Visit: Payer: Self-pay

## 2018-07-22 ENCOUNTER — Ambulatory Visit: Payer: Medicaid Other | Admitting: Nurse Practitioner

## 2018-07-22 ENCOUNTER — Encounter: Payer: Self-pay | Admitting: Nurse Practitioner

## 2018-07-22 VITALS — BP 156/86 | HR 82 | Temp 97.7°F | Resp 16 | Ht 64.0 in | Wt 173.6 lb

## 2018-07-22 DIAGNOSIS — I10 Essential (primary) hypertension: Secondary | ICD-10-CM

## 2018-07-22 DIAGNOSIS — K429 Umbilical hernia without obstruction or gangrene: Secondary | ICD-10-CM | POA: Diagnosis not present

## 2018-07-22 MED ORDER — LOSARTAN POTASSIUM 25 MG PO TABS: 50.0000 mg | ORAL_TABLET | Freq: Every day | ORAL | 3 refills | Status: DC

## 2018-07-22 NOTE — Progress Notes (Signed)
[facility] [address] [place], [place] [address]  Internal MEDICINE  Office Visit Note  Patient Name: Ashley Mcpherson  [id number]  [id number]  Date of Service: 08/07/2018  Chief Complaint  Patient presents with  . Pain    Possible hernia near umbilical area, sore, noticed it a few days ago, pt did help mom move some bed furniture recently     The patient states that she has noted a palpable mass in the abdomen adjacent to her navel. She states that it is tender, especially when she "mashes" o nit. She has not no other abdominal pain, nausea, vomiting, or diarrhea. She states that she first noted this a few days ago.  Blood pressure moderately elevated. She is currently taking losartan 25mg  every day.   Pt is here for a sick visit.     Current Medication:  Outpatient Encounter Medications as of 07/22/2018  Medication Sig  . citalopram (CELEXA) 10 MG tablet Take 10 mg by mouth. Take 2 tab daily  . HYDROcodone-acetaminophen (NORCO/VICODIN) 5-325 MG tablet Take 1 tablet by mouth 3 (three) times daily as needed for moderate pain.  Marland Kitchen linaclotide (LINZESS) 290 MCG CAPS capsule Take 290 mcg by mouth daily before breakfast.  . losartan (COZAAR) 25 MG tablet Take 2 tablets (50 mg total) by mouth daily.  . naproxen sodium (ANAPROX DS) 550 MG tablet Take 1 tablet (550 mg total) by mouth 2 (two) times daily with a meal.  . omeprazole (PRILOSEC) 40 MG capsule Take 40 mg by mouth daily.  Marland Kitchen tiZANidine (ZANAFLEX) 2 MG tablet Take 1 tablet (2 mg total) by mouth 2 (two) times daily as needed for muscle spasms.  . [DISCONTINUED] losartan (COZAAR) 25 MG tablet Take 1 tablet (25 mg total) by mouth daily.   No facility-administered encounter medications on file as of 07/22/2018.       Medical History: Past Medical History:  Diagnosis Date  . Hypertension   . IBS (irritable bowel syndrome)   . Kidney stone      Today's Vitals   07/22/18 1128  BP: (!) 156/86  Pulse: 82   Resp: 16  Temp: 97.7 F (36.5 C)  SpO2: 98%  Weight: 173 lb 9.6 oz (78.7 kg)  Height: 5\' 4"  (1.626 m)   Body mass index is 29.8 kg/m.   Review of Systems  Constitutional: Positive for fatigue. Negative for chills and unexpected weight change.  HENT: Negative for congestion, rhinorrhea, sneezing and sore throat.   Respiratory: Negative for cough, chest tightness, shortness of breath and wheezing.   Cardiovascular: Negative for chest pain and palpitations.       Elevated blood pressure   Gastrointestinal: Positive for abdominal pain. Negative for constipation, diarrhea, nausea and vomiting.       Palpable mass adjacent to the naval. Slightly tender.   Endocrine: Negative for cold intolerance, heat intolerance, polydipsia and polyuria.  Genitourinary: Negative for dysuria, flank pain, frequency, hematuria and pelvic pain.  Musculoskeletal: Positive for back pain and myalgias. Negative for arthralgias, joint swelling and neck pain.  Skin: Negative for rash.  Allergic/Immunologic: Negative for environmental allergies.  Neurological: Negative for tremors and numbness.  Hematological: Negative for adenopathy. Does not bruise/bleed easily.  Psychiatric/Behavioral: Negative for behavioral problems and sleep disturbance. The patient is not nervous/anxious.     Physical Exam Vitals signs and nursing note reviewed.  Constitutional:      General: She is not in acute distress.    Appearance: Normal appearance. She is  well-developed. She is not diaphoretic.  HENT:     Head: Normocephalic and atraumatic.     Mouth/Throat:     Pharynx: No oropharyngeal exudate.  Eyes:     Conjunctiva/sclera: Conjunctivae normal.     Pupils: Pupils are equal, round, and reactive to light.  Neck:     Musculoskeletal: Normal range of motion and neck supple.     Thyroid: No thyromegaly.     Vascular: No JVD.     Trachea: No tracheal deviation.  Cardiovascular:     Rate and Rhythm: Normal rate and regular  rhythm.     Heart sounds: Normal heart sounds. No murmur. No friction rub. No gallop.   Pulmonary:     Effort: Pulmonary effort is normal. No respiratory distress.     Breath sounds: Normal breath sounds. No wheezing or rales.  Chest:     Chest wall: No tenderness.  Abdominal:     Palpations: Abdomen is soft.     Tenderness: There is abdominal tenderness. There is right CVA tenderness and left CVA tenderness. There is no guarding.    Musculoskeletal: Normal range of motion.  Lymphadenopathy:     Cervical: No cervical adenopathy.  Skin:    General: Skin is warm and dry.  Neurological:     Mental Status: She is alert and oriented to person, place, and time.     Cranial Nerves: No cranial nerve deficit.  Psychiatric:        Behavior: Behavior normal.        Thought Content: Thought content normal.        Judgment: Judgment normal.   Assessment/Plan: 1. Umbilical hernia without obstruction and without gangrene Will get ultrasound of palpable mass for further evaluation. Refer to surgery as indicated.  - US Abdomen Complete; Future  2. Essential hypertension Increase losartan 25mg  to two tablets daily. Heart health diet was stressed and importance of routine exercise . - losartan (COZAAR) 25 MG tablet; Take 2 tablets (50 mg total) by mouth daily.  Dispense: 60 tablet; Refill: 3  General Counseling: Yee verbalizes understanding of the findings of todays visit and agrees with plan of treatment. I have discussed any further diagnostic evaluation that may be needed or ordered today. We also reviewed her medications today. she has been encouraged to call the office with any questions or concerns that should arise related to todays visit.    Counseling:  Hypertension Counseling:   The following hypertensive lifestyle modification were recommended and discussed:  1. Limiting alcohol intake to less than 1 oz/day of ethanol:(24 oz of beer or 8 oz of wine or 2 oz of 100-proof  whiskey). 2. Take baby ASA 81 mg daily. 3. Importance of regular aerobic exercise and losing weight. 4. Reduce dietary saturated fat and cholesterol intake for overall cardiovascular health. 5. Maintaining adequate dietary potassium, calcium, and magnesium intake. 6. Regular monitoring of the blood pressure. 7. Reduce sodium intake to less than 100 mmol/day (less than 2.3 gm of sodium or less than 6 gm of sodium choride)   This patient was seen by Vincent GrosHeather Cavion Faiola FNP Collaboration with Dr Lyndon CodeFozia M Khan as a part of collaborative care agreement  Orders Placed This Encounter  Procedures  . US Abdomen Complete    Meds ordered this encounter  Medications  . losartan (COZAAR) 25 MG tablet    Sig: Take 2 tablets (50 mg total) by mouth daily.    Dispense:  60 tablet    Refill:  3  Please note increased dose    Order Specific Question:   Supervising Provider    Answer:   Lyndon Code [[id number]]    Time spent: 25 Minutes

## 2018-07-22 NOTE — Progress Notes (Signed)
Pt blood pressure elevated, taken twice  1st reading 147/97 2nd reading 156/86 Informed provider

## 2018-07-29 ENCOUNTER — Emergency Department: Payer: Medicaid Other

## 2018-07-29 ENCOUNTER — Emergency Department
Admission: EM | Admit: 2018-07-29 | Discharge: 2018-07-29 | Disposition: A | Discharge status: Home / Self Care | Payer: Medicaid Other | Attending: Emergency Medicine | Admitting: Emergency Medicine

## 2018-07-29 ENCOUNTER — Other Ambulatory Visit: Payer: Self-pay

## 2018-07-29 DIAGNOSIS — R1031 Right lower quadrant pain: Secondary | ICD-10-CM | POA: Diagnosis not present

## 2018-07-29 DIAGNOSIS — Z87442 Personal history of urinary calculi: Secondary | ICD-10-CM | POA: Diagnosis not present

## 2018-07-29 DIAGNOSIS — I10 Essential (primary) hypertension: Secondary | ICD-10-CM | POA: Diagnosis not present

## 2018-07-29 DIAGNOSIS — Z79899 Other long term (current) drug therapy: Secondary | ICD-10-CM | POA: Insufficient documentation

## 2018-07-29 LAB — COMPREHENSIVE METABOLIC PANEL
ALT: 16 U/L (ref 0–44)
AST: 19 U/L (ref 15–41)
Albumin: 4 g/dL (ref 3.5–5.0)
Alkaline Phosphatase: 55 U/L (ref 38–126)
Anion gap: 7 (ref 5–15)
BUN: 11 mg/dL (ref 6–20)
CO2: 25 mmol/L (ref 22–32)
Calcium: 8.4 mg/dL — ABNORMAL LOW (ref 8.9–10.3)
Chloride: 105 mmol/L (ref 98–111)
Creatinine, Ser: 0.68 mg/dL (ref 0.44–1.00)
GFR calc Af Amer: >60 mL/min (ref >60)
GFR calc non Af Amer: >60 mL/min (ref >60)
Glucose, Bld: 106 mg/dL — ABNORMAL HIGH (ref 70–99)
Potassium: 3.5 mmol/L (ref 3.5–5.1)
Sodium: 137 mmol/L (ref 135–145)
Total Bilirubin: 0.5 mg/dL (ref 0.3–1.2)
Total Protein: 7.4 g/dL (ref 6.5–8.1)

## 2018-07-29 LAB — CBC
HCT: 35.7 % — ABNORMAL LOW (ref 36.0–46.0)
Hemoglobin: 11 g/dL — ABNORMAL LOW (ref 12.0–15.0)
MCH: 23.7 pg — ABNORMAL LOW (ref 26.0–34.0)
MCHC: 30.8 g/dL (ref 30.0–36.0)
MCV: 76.9 fL — ABNORMAL LOW (ref 80.0–100.0)
Platelets: 271 10*3/uL (ref 150–400)
RBC: 4.64 MIL/uL (ref 3.87–5.11)
RDW: 14.8 % (ref 11.5–15.5)
WBC: 6.6 10*3/uL (ref 4.0–10.5)
nRBC: 0 % (ref 0.0–0.2)

## 2018-07-29 LAB — URINALYSIS, COMPLETE (UACMP) WITH MICROSCOPIC
Bilirubin Urine: NEGATIVE
Glucose, UA: NEGATIVE mg/dL
Ketones, ur: NEGATIVE mg/dL
Leukocytes,Ua: NEGATIVE
Nitrite: NEGATIVE
Protein, ur: NEGATIVE mg/dL
Specific Gravity, Urine: 1.012 (ref 1.005–1.030)
pH: 6 (ref 5.0–8.0)

## 2018-07-29 LAB — LIPASE, BLOOD: Lipase: 26 U/L (ref 11–51)

## 2018-07-29 MED ORDER — SODIUM CHLORIDE 0.9% FLUSH: 3.0000 mL | Freq: Once | INTRAVENOUS | Status: DC

## 2018-07-29 MED ORDER — POLYETHYLENE GLYCOL 3350 17 GM/SCOOP PO POWD: 17.0000 g | Freq: Every day | ORAL | 0 refills | Status: DC | PRN

## 2018-07-29 NOTE — ED Notes (Signed)
Pt discharged home after verbalizing understanding of discharge instructions; nad noted. 

## 2018-07-29 NOTE — ED Triage Notes (Signed)
Pt c/o RLQ pain with nausea since 4am,. Denies vomiting or diarrhea

## 2018-07-29 NOTE — ED Provider Notes (Signed)
[facility] Emergency Department Provider Note  Time seen: 10:34 AM  I have reviewed the triage vital signs and the nursing notes.   HISTORY  Chief Complaint Abdominal Pain   HPI Ashley Mcpherson is a 43 y.o. female a past medical history of hypertension, kidney stones, presents to the emergency department for right-sided abdominal pain.  According to the patient around 4:00 this morning she was awoken with right flank/right lower quadrant abdominal pain.  States moderate to severe in intensity, states nausea but denies any vomiting or diarrhea.  No dysuria or hematuria.  No vaginal bleeding or discharge.  No fever cough or congestion.    Past Medical History:  Diagnosis Date  . Hypertension   . IBS (irritable bowel syndrome)   . Kidney stone     Patient Active Problem List   Diagnosis Date Noted  . Flank pain 06/02/2018  . Renal calculi 06/02/2018  . Encounter for general adult medical examination with abnormal findings 11/28/2017  . Post traumatic stress disorder 11/28/2017  . Urinary tract infection with hematuria 08/12/2017  . Dysuria 08/12/2017  . Chronic bilateral low back pain 08/12/2017  . Essential hypertension 04/17/2017    Past Surgical History:  Procedure Laterality Date  . CRYOABLATION  2010  . TUBAL LIGATION  2007  . VAGINAL HYSTERECTOMY  2013   Dr. [facility]    Prior to Admission medications   Medication Sig Start Date End Date Taking? Authorizing Provider  citalopram (CELEXA) 10 MG tablet Take 10 mg by mouth. Take 2 tab daily    [provider]  HYDROcodone-acetaminophen (NORCO/VICODIN) 5-325 MG tablet Take 1 tablet by mouth 3 (three) times daily as needed for moderate pain. 07/05/18   Carlean JewsBoscia, Heather E, NP  linaclotide (LINZESS) 290 MCG CAPS capsule Take 290 mcg by mouth daily before breakfast.    [provider]  losartan (COZAAR) 25 MG tablet Take 2 tablets (50 mg total) by mouth daily. 07/22/18   Carlean JewsBoscia,  Heather E, NP  naproxen sodium (ANAPROX DS) 550 MG tablet Take 1 tablet (550 mg total) by mouth 2 (two) times daily with a meal. 07/05/18   Boscia, Kathlynn GrateHeather E, NP  omeprazole (PRILOSEC) 40 MG capsule Take 40 mg by mouth daily.    [provider]  tiZANidine (ZANAFLEX) 2 MG tablet Take 1 tablet (2 mg total) by mouth 2 (two) times daily as needed for muscle spasms. 04/17/17   Carlean JewsBoscia, Heather E, NP    Allergies  Allergen Reactions  . Sulfa Antibiotics Other (See Comments)    Mouth sores    Family History  Problem Relation Age of Onset  . Diabetes Mother   . Hypertension Mother   . Diabetes Father   . Hypertension Father   . Bladder Cancer Neg Hx   . Kidney cancer Neg Hx     Social History Social History   Tobacco Use  . Smoking status: Never Smoker  . Smokeless tobacco: Never Used  Substance Use Topics  . Alcohol use: No  . Drug use: No    Review of Systems Constitutional: Negative for fever. ENT: Negative for recent illness/congestion Cardiovascular: Negative for chest pain. Respiratory: Negative for shortness of breath.  Negative for cough. Gastrointestinal: Negative for abdominal pain, vomiting.  Positive for nausea. Genitourinary: Negative for urinary compaints Musculoskeletal: Negative for musculoskeletal complaints Skin: Negative for skin complaints  Neurological: Negative for headache All other ROS negative  ____________________________________________   PHYSICAL EXAM:  VITAL SIGNS: ED Triage Vitals [  07/29/18 0855]  Enc Vitals Group     BP (!) 175/104     Pulse Rate 86     Resp 17     Temp 97.9 F (36.6 C)     Temp Source Oral     SpO2 100 %     Weight 173 lb (78.5 kg)     Height 5\' 4"  (1.626 m)     Head Circumference      Peak Flow      Pain Score 7     Pain Loc      Pain Edu?      Excl. in GC?    Constitutional: Alert and oriented. Well appearing and in no distress. Eyes: Normal exam ENT      Head: Normocephalic and atraumatic.       Mouth/Throat: Mucous membranes are moist. Cardiovascular: Normal rate, regular rhythm.  Respiratory: Normal respiratory effort without tachypnea nor retractions. Breath sounds are clear Gastrointestinal: Soft, mild to moderate right lower quadrant tenderness palpation without rebound guarding or distention. Musculoskeletal: Nontender with normal range of motion in all extremities.  Neurologic:  Normal speech and language. No gross focal neurologic deficits  Skin:  Skin is warm, dry and intact.  Psychiatric: Mood and affect are normal.   ____________________________________________  RADIOLOGY  CT scan is essentially negative.  ____________________________________________   INITIAL IMPRESSION / ASSESSMENT AND PLAN / ED COURSE  Pertinent labs & imaging results that were available during my care of the patient were reviewed by me and considered in my medical decision making (see chart for details).   Patient presents to the emergency department for right lower quadrant abdominal pain since 4:00 this morning.  Patient has a history of kidney stones which this feels somewhat similar.  Denies any dysuria or hematuria.  No vaginal bleeding or discharge.  States nausea but denies any vomiting or diarrhea.  We will obtain CT imaging of the abdomen to further evaluate.  Patient strongly does not want an IV placed, we will proceed with CT renal scan to further evaluate.  CT scan is essentially negative.  On my review of the CT she appears to have a large amount of right-sided stool.  We will place the patient on MiraLAX, we will discharge with PCP follow-up.  Patient agreeable to plan of care.  Ashley Mcpherson was evaluated in Emergency Department on 07/29/2018 for the symptoms described in the history of present illness. She was evaluated in the context of the global COVID-19 pandemic, which necessitated consideration that the patient might be at risk for infection with the SARS-CoV-2 virus that causes  COVID-19. Institutional protocols and algorithms that pertain to the evaluation of patients at risk for COVID-19 are in a state of rapid change based on information released by regulatory bodies including the CDC and federal and state organizations. These policies and algorithms were followed during the patient's care in the ED.  ____________________________________________   FINAL CLINICAL IMPRESSION(S) / ED DIAGNOSES  Right lower quadrant abdominal pain   Minna Antis, MD 07/29/18 1109

## 2018-07-29 NOTE — ED Notes (Signed)
Pt with RLQ pain since this morning; states she has hx of kidney stones but that this feels different. Endorses nausea, denies vomiting; denies dysuria. Pt alert & oriented, nad noted.

## 2018-08-02 ENCOUNTER — Other Ambulatory Visit: Payer: Medicaid Other

## 2018-08-02 ENCOUNTER — Ambulatory Visit: Payer: Medicaid Other | Admitting: Nurse Practitioner

## 2018-08-07 DIAGNOSIS — K429 Umbilical hernia without obstruction or gangrene: Secondary | ICD-10-CM | POA: Insufficient documentation

## 2018-08-09 ENCOUNTER — Ambulatory Visit: Payer: Medicaid Other

## 2018-08-09 ENCOUNTER — Other Ambulatory Visit: Payer: Self-pay

## 2018-08-09 DIAGNOSIS — K429 Umbilical hernia without obstruction or gangrene: Secondary | ICD-10-CM

## 2018-08-13 ENCOUNTER — Other Ambulatory Visit: Payer: Self-pay

## 2018-08-13 ENCOUNTER — Encounter: Payer: Self-pay | Admitting: Nurse Practitioner

## 2018-08-13 ENCOUNTER — Ambulatory Visit: Payer: Medicaid Other | Admitting: Nurse Practitioner

## 2018-08-13 VITALS — Ht 64.0 in | Wt 173.0 lb

## 2018-08-13 DIAGNOSIS — K581 Irritable bowel syndrome with constipation: Secondary | ICD-10-CM | POA: Insufficient documentation

## 2018-08-13 DIAGNOSIS — K5900 Constipation, unspecified: Secondary | ICD-10-CM | POA: Diagnosis not present

## 2018-08-13 DIAGNOSIS — R229 Localized swelling, mass and lump, unspecified: Secondary | ICD-10-CM | POA: Diagnosis not present

## 2018-08-13 DIAGNOSIS — N200 Calculus of kidney: Secondary | ICD-10-CM

## 2018-08-13 NOTE — Progress Notes (Signed)
[facility] [address] [place], [place] [id number]  Internal MEDICINE  Telephone Visit  Patient Name: Ashley Mcpherson  [id number]  [id number]  Date of Service: 08/13/2018  I connected with the patient at 10:32am by webcam and verified the patients identity using two identifiers.   I discussed the limitations, risks, security and privacy concerns of performing an evaluation and management service by webcam and the availability of in person appointments. I also discussed with the patient that there may be a patient responsible charge related to the service.  The patient expressed understanding and agrees to proceed.    Chief Complaint  Patient presents with  . Telephone Screen    VIDEO VISIT [phone]  . Telephone Assessment  . Medical Management of Chronic Issues    Follow up,   . Labs Only    ultrasound results  . Hypertension    increased blood pressure medication    The patient has been contacted via webcam for follow up visit due to concerns for spread of novel coronavirus. The patient states that she has noted a palpable mass in the abdomen adjacent to her navel. She states that it is tender, especially when she "mashes" o nit. She has not no other abdominal pain, nausea, vomiting, or diarrhea.. since her last visit, she states that pain got so severe that she ended up going to ER. She did have CT scan done. Continues to have bilateral renal stones which are non-obstructing. She was told she was significantly constipated. She takes linzess ever day. Was asked to add miralax in the evenings to help with constipation. Her CT was otherwise normal. She also had abdominal ultrasound done here and it was normal study. She states that she is feeling mostly better since her trim to ER. She sees urology for treatment of kidney stones.       Current Medication: Outpatient Encounter Medications as of 08/13/2018  Medication Sig  . citalopram (CELEXA) 10 MG tablet Take 10 mg by  mouth. Take 2 tab daily  . HYDROcodone-acetaminophen (NORCO/VICODIN) 5-325 MG tablet Take 1 tablet by mouth 3 (three) times daily as needed for moderate pain.  Marland Kitchen. linaclotide (LINZESS) 290 MCG CAPS capsule Take 290 mcg by mouth daily before breakfast.  . losartan (COZAAR) 25 MG tablet Take 2 tablets (50 mg total) by mouth daily.  . naproxen sodium (ANAPROX DS) 550 MG tablet Take 1 tablet (550 mg total) by mouth 2 (two) times daily with a meal.  . omeprazole (PRILOSEC) 40 MG capsule Take 40 mg by mouth daily.  . polyethylene glycol powder (GLYCOLAX/MIRALAX) 17 GM/SCOOP powder Take 17 g by mouth daily as needed for moderate constipation.  Marland Kitchen. tiZANidine (ZANAFLEX) 2 MG tablet Take 1 tablet (2 mg total) by mouth 2 (two) times daily as needed for muscle spasms.   No facility-administered encounter medications on file as of 08/13/2018.     Surgical History: Past Surgical History:  Procedure Laterality Date  . CRYOABLATION  2010  . TUBAL LIGATION  2007  . VAGINAL HYSTERECTOMY  2013   Dr. Logan [facility]ans [facility])    Medical History: Past Medical History:  Diagnosis Date  . Hypertension   . IBS (irritable bowel syndrome)   . Kidney stone     Family History: Family History  Problem Relation Age of Onset  . Diabetes Mother   . Hypertension Mother   . Diabetes Father   . Hypertension Father   . Bladder Cancer Neg Hx   . Kidney cancer  Neg Hx     Social History   Socioeconomic History  . Marital status: Single    Spouse name: Not on file  . Number of children: Not on file  . Years of education: Not on file  . Highest education level: Not on file  Occupational History  . Not on file  Social Needs  . Financial resource strain: Not on file  . Food insecurity:    Worry: Not on file    Inability: Not on file  . Transportation needs:    Medical: Not on file    Non-medical: Not on file  Tobacco Use  . Smoking status: Never Smoker  . Smokeless tobacco: Never Used  Substance and Sexual Activity   . Alcohol use: No  . Drug use: No  . Sexual activity: Yes    Birth control/protection: Surgical  Lifestyle  . Physical activity:    Days per week: Not on file    Minutes per session: Not on file  . Stress: Not on file  Relationships  . Social connections:    Talks on phone: Not on file    Gets together: Not on file    Attends religious service: Not on file    Active member of club or organization: Not on file    Attends meetings of clubs or organizations: Not on file    Relationship status: Not on file  . Intimate partner violence:    Fear of current or ex partner: Not on file    Emotionally abused: Not on file    Physically abused: Not on file    Forced sexual activity: Not on file  Other Topics Concern  . Not on file  Social History Narrative  . Not on file      Review of Systems  Constitutional: Positive for fatigue. Negative for chills and unexpected weight change.  HENT: Negative for congestion, rhinorrhea, sneezing and sore throat.   Respiratory: Negative for cough, chest tightness, shortness of breath and wheezing.   Cardiovascular: Negative for chest pain and palpitations.       Elevated blood pressure   Gastrointestinal: Positive for abdominal pain. Negative for constipation, diarrhea, nausea and vomiting.       Palpable mass adjacent to the naval. Slightly tender. Improved symptoms since her last visit .  Endocrine: Negative for cold intolerance, heat intolerance, polydipsia and polyuria.  Genitourinary: Negative for dysuria, flank pain, frequency, hematuria and pelvic pain.  Musculoskeletal: Positive for back pain and myalgias. Negative for arthralgias, joint swelling and neck pain.  Skin: Negative for rash.  Allergic/Immunologic: Negative for environmental allergies.  Neurological: Negative for tremors and numbness.  Hematological: Negative for adenopathy. Does not bruise/bleed easily.  Psychiatric/Behavioral: Negative for behavioral problems and sleep  disturbance. The patient is not nervous/anxious.     Today's Vitals   08/13/18 1006  Weight: 173 lb (78.5 kg)  Height:  (1.626 m)   Body mass index is 29.7 kg/m.  Observation/Objective:   The patient is alert and oriented. She is pleasant and answers all questions appropriately. Breathing is non-labored. She is in no acute distress at this time.    Assessment/Plan: 1. Local superficial swelling, mass or lump Reviewed results of abdominal ultrasound as well as abdominal CT done in ER. Both negative for palpable or nodular mass in the abdomen. Will continue to monitor.   2. Renal calculi Stable, bilateral renal calculi in CT scan. Continue to see urology as scheduled.   3. Constipation, unspecified constipation type  Add dose of miralax every evening with full glass of water. Continue linzess as previously prescribed .  General Counseling: Ashley Mcpherson verbalizes understanding of the findings of today's phone visit and agrees with plan of treatment. I have discussed any further diagnostic evaluation that may be needed or ordered today. We also reviewed her medications today. she has been encouraged to call the office with any questions or concerns that should arise related to todays visit.   This patient was seen by Vincent Gros FNP Collaboration with Dr Lyndon Code as a part of collaborative care agreement   Time spent: 20 Minutes    Dr Lyndon Code Internal medicine

## 2018-08-21 ENCOUNTER — Telehealth: Payer: Self-pay | Admitting: Urology

## 2018-08-21 NOTE — Telephone Encounter (Signed)
Please resend another LithoLink kit to pt. She states she didn't know she was supposed to keep collection refrigerated, per pt, her collection was never refrigerated. Please advise. Thanks.

## 2018-08-22 NOTE — Telephone Encounter (Signed)
Patient was notified that she does not need to refrigerate per litho link since they now have a preservative to add to the jug. A new form was faxed for a new kit to be sent to her

## 2018-09-06 ENCOUNTER — Other Ambulatory Visit: Payer: Medicaid Other

## 2018-09-06 ENCOUNTER — Other Ambulatory Visit: Payer: Self-pay

## 2018-09-11 ENCOUNTER — Telehealth: Payer: Self-pay | Admitting: Urology

## 2018-09-11 ENCOUNTER — Other Ambulatory Visit: Payer: Self-pay | Admitting: Urology

## 2018-09-11 NOTE — Telephone Encounter (Signed)
Please let Ashley Mcpherson know that her metabolic workup still indicates she is not taking in enough fluid.  She needs to be drinking 2.5 L of water daily. (5 bottles of water or 10 cups of water)   It also indicates that she should start potassium citrate.   We have an OTC options called LithoLyte.  I would like her to start this supplement and put two stick daily into her water.  We will then need to recheck her Litholink again in 6 weeks.

## 2018-09-12 NOTE — Telephone Encounter (Signed)
PAtient notified and voiced understanding. Litholink has been faxed and should be sent to patient August 08/2018.

## 2018-10-08 ENCOUNTER — Other Ambulatory Visit: Payer: Self-pay

## 2018-10-08 ENCOUNTER — Encounter: Payer: Self-pay | Admitting: Nurse Practitioner

## 2018-10-08 ENCOUNTER — Ambulatory Visit: Payer: Medicaid Other | Admitting: Nurse Practitioner

## 2018-10-08 VITALS — BP 129/88 | HR 75 | Resp 16 | Ht 64.0 in | Wt 179.0 lb

## 2018-10-08 DIAGNOSIS — F4310 Post-traumatic stress disorder, unspecified: Secondary | ICD-10-CM

## 2018-10-08 DIAGNOSIS — I10 Essential (primary) hypertension: Secondary | ICD-10-CM | POA: Diagnosis not present

## 2018-10-08 MED ORDER — HYDROCHLOROTHIAZIDE 12.5 MG PO TABS: 12.5000 mg | ORAL_TABLET | Freq: Every day | ORAL | 3 refills | Status: DC

## 2018-10-08 NOTE — Progress Notes (Signed)
[facility] [place], [place] [address]  Internal MEDICINE  Office Visit Note  Patient Name: Ashley Mcpherson  [id number]  [id number]  Date of Service: 10/20/2018  Chief Complaint  Patient presents with  . Medical Management of Chronic Issues  . Hypertension  . Chronic Kidney Disease  . Medication Management    added losartan, patient believes it is making her gain weight    The patient is here for follow up of blood pressure. She was started on losartan 50mg  daily. Blood pressure has responded well to this. She states that she has gained weight or fluid. Seems to pick up weight constantly without any changes to diet and exercise. She denies chest pain, chest pressure, or shortness of breath .      Current Medication: Outpatient Encounter Medications as of 10/08/2018  Medication Sig  . citalopram (CELEXA) 10 MG tablet Take 10 mg by mouth. Take 2 tab daily  . HYDROcodone-acetaminophen (NORCO/VICODIN) 5-325 MG tablet Take 1 tablet by mouth 3 (three) times daily as needed for moderate pain.  Marland Kitchen linaclotide (LINZESS) 290 MCG CAPS capsule Take 290 mcg by mouth daily before breakfast.  . losartan (COZAAR) 25 MG tablet Take 2 tablets (50 mg total) by mouth daily.  . naproxen sodium (ANAPROX DS) 550 MG tablet Take 1 tablet (550 mg total) by mouth 2 (two) times daily with a meal.  . omeprazole (PRILOSEC) 40 MG capsule Take 40 mg by mouth daily.  . polyethylene glycol powder (GLYCOLAX/MIRALAX) 17 GM/SCOOP powder Take 17 g by mouth daily as needed for moderate constipation.  Marland Kitchen tiZANidine (ZANAFLEX) 2 MG tablet Take 1 tablet (2 mg total) by mouth 2 (two) times daily as needed for muscle spasms.  . hydrochlorothiazide (HYDRODIURIL) 12.5 MG tablet Take 1 tablet (12.5 mg total) by mouth daily.  . [DISCONTINUED] losartan (COZAAR) 25 MG tablet Take 1 tablet (25 mg total) by mouth daily.   No facility-administered encounter medications on file as of 10/08/2018.     Surgical  History: Past Surgical History:  Procedure Laterality Date  . CRYOABLATION  2010  . TUBAL LIGATION  2007  . VAGINAL HYSTERECTOMY  2013   Dr. Amalia Hailey [facility])    Medical History: Past Medical History:  Diagnosis Date  . Hypertension   . IBS (irritable bowel syndrome)   . Kidney stone     Family History: Family History  Problem Relation Age of Onset  . Diabetes Mother   . Hypertension Mother   . Diabetes Father   . Hypertension Father   . Bladder Cancer Neg Hx   . Kidney cancer Neg Hx     Social History   Socioeconomic History  . Marital status: Single    Spouse name: Not on file  . Number of children: Not on file  . Years of education: Not on file  . Highest education level: Not on file  Occupational History  . Not on file  Social Needs  . Financial resource strain: Not on file  . Food insecurity    Worry: Not on file    Inability: Not on file  . Transportation needs    Medical: Not on file    Non-medical: Not on file  Tobacco Use  . Smoking status: Never Smoker  . Smokeless tobacco: Never Used  Substance and Sexual Activity  . Alcohol use: No  . Drug use: No  . Sexual activity: Yes    Birth control/protection: Surgical  Lifestyle  . Physical activity  Days per week: Not on file    Minutes per session: Not on file  . Stress: Not on file  Relationships  . Social Musicianconnections    Talks on phone: Not on file    Gets together: Not on file    Attends religious service: Not on file    Active member of club or organization: Not on file    Attends meetings of clubs or organizations: Not on file    Relationship status: Not on file  . Intimate partner violence    Fear of current or ex partner: Not on file    Emotionally abused: Not on file    Physically abused: Not on file    Forced sexual activity: Not on file  Other Topics Concern  . Not on file  Social History Narrative  . Not on file      Review of Systems  Constitutional: Negative for chills,  fatigue and unexpected weight change.  HENT: Negative for congestion, rhinorrhea, sneezing and sore throat.   Respiratory: Negative for cough, chest tightness, shortness of breath and wheezing.   Cardiovascular: Negative for chest pain and palpitations.  Gastrointestinal: Positive for abdominal pain. Negative for constipation, diarrhea, nausea and vomiting.       Palpable mass adjacent to the naval. Slightly tender. Improved symptoms since her last visit .  Endocrine: Negative for cold intolerance, heat intolerance, polydipsia and polyuria.  Genitourinary: Negative for dysuria, flank pain, frequency, hematuria and pelvic pain.  Musculoskeletal: Positive for back pain and myalgias. Negative for arthralgias, joint swelling and neck pain.  Skin: Negative for rash.  Allergic/Immunologic: Negative for environmental allergies.  Neurological: Negative for tremors and numbness.  Hematological: Negative for adenopathy. Does not bruise/bleed easily.  Psychiatric/Behavioral: Negative for behavioral problems and sleep disturbance. The patient is not nervous/anxious.     Today's Vitals   10/08/18 1534  BP: 129/88  Pulse: 75  Resp: 16  SpO2: 100%  Weight: 179 lb (81.2 kg)  Height: 5\' 4"  (1.626 m)   Body mass index is 30.73 kg/m.  Physical Exam Vitals signs and nursing note reviewed.  Constitutional:      General: She is not in acute distress.    Appearance: Normal appearance. She is well-developed. She is not diaphoretic.  HENT:     Head: Normocephalic and atraumatic.     Mouth/Throat:     Pharynx: No oropharyngeal exudate.  Eyes:     Pupils: Pupils are equal, round, and reactive to light.  Neck:     Musculoskeletal: Normal range of motion and neck supple.     Thyroid: No thyromegaly.     Vascular: No JVD.     Trachea: No tracheal deviation.  Cardiovascular:     Rate and Rhythm: Normal rate and regular rhythm.     Heart sounds: Normal heart sounds. No murmur. No friction rub. No  gallop.   Pulmonary:     Effort: Pulmonary effort is normal. No respiratory distress.     Breath sounds: Normal breath sounds. No wheezing or rales.  Chest:     Chest wall: No tenderness.  Abdominal:     General: Bowel sounds are normal.     Palpations: Abdomen is soft.  Musculoskeletal: Normal range of motion.  Lymphadenopathy:     Cervical: No cervical adenopathy.  Skin:    General: Skin is warm and dry.  Neurological:     Mental Status: She is alert and oriented to person, place, and time.     Cranial  Nerves: No cranial nerve deficit.  Psychiatric:        Behavior: Behavior normal.        Thought Content: Thought content normal.        Judgment: Judgment normal.   Assessment/Plan: 1. Essential hypertension Start HCTZ 12.5mg  tablets daily. Continue losartan 50mg  daily.  - hydrochlorothiazide (HYDRODIURIL) 12.5 MG tablet; Take 1 tablet (12.5 mg total) by mouth daily.  Dispense: 30 tablet; Refill: 3  2. Post traumatic stress disorder [facility]   General Counseling: Apolonia verbalizes understanding of the findings of todays visit and agrees with plan of treatment. I have discussed any further diagnostic evaluation that may be needed or ordered today. We also reviewed her medications today. she has been encouraged to call the office with any questions or concerns that should arise related to todays visit.  Hypertension Counseling:   The following hypertensive lifestyle modification were recommended and discussed:  1. Limiting alcohol intake to less than 1 oz/day of ethanol:(24 oz of beer or 8 oz of wine or 2 oz of 100-proof whiskey). 2. Take baby ASA 81 mg daily. 3. Importance of regular aerobic exercise and losing weight. 4. Reduce dietary saturated fat and cholesterol intake for overall cardiovascular health. 5. Maintaining adequate dietary potassium, calcium, and magnesium intake. 6. Regular monitoring of the blood pressure. 7. Reduce  sodium intake to less than 100 mmol/day (less than 2.3 gm of sodium or less than 6 gm of sodium choride)   This patient was seen by Vincent GrosHeather Mycah Mcdougall FNP Collaboration with Dr Lyndon CodeFozia M Khan as a part of collaborative care agreement  Meds ordered this encounter  Medications  . hydrochlorothiazide (HYDRODIURIL) 12.5 MG tablet    Sig: Take 1 tablet (12.5 mg total) by mouth daily.    Dispense:  30 tablet    Refill:  3    Order Specific Question:   Supervising Provider    Answer:   Lyndon CodeKHAN, FOZIA M [[id number]]    Time spent: 1725 Minutes      Dr Lyndon CodeFozia M Khan Internal medicine

## 2018-11-05 ENCOUNTER — Ambulatory Visit: Payer: Medicaid Other | Admitting: Nurse Practitioner

## 2018-12-18 ENCOUNTER — Ambulatory Visit: Payer: Medicaid Other | Admitting: Adult Health

## 2018-12-18 ENCOUNTER — Encounter: Payer: Self-pay | Admitting: Adult Health

## 2018-12-18 ENCOUNTER — Other Ambulatory Visit: Payer: Self-pay

## 2018-12-18 VITALS — BP 143/101 | HR 81 | Temp 97.3°F | Resp 16 | Ht 64.0 in | Wt 178.0 lb

## 2018-12-18 DIAGNOSIS — Z1231 Encounter for screening mammogram for malignant neoplasm of breast: Secondary | ICD-10-CM

## 2018-12-18 DIAGNOSIS — I10 Essential (primary) hypertension: Secondary | ICD-10-CM | POA: Diagnosis not present

## 2018-12-18 DIAGNOSIS — K5900 Constipation, unspecified: Secondary | ICD-10-CM

## 2018-12-18 DIAGNOSIS — Z9114 Patient's other noncompliance with medication regimen: Secondary | ICD-10-CM

## 2018-12-18 NOTE — Progress Notes (Addendum)
[facility] [address] [place], [place] [address]  Internal MEDICINE  Office Visit Note  Patient Name: Ashley Mcpherson  [id number]  [id number]  Date of Service: 12/18/2018  Chief Complaint  Patient presents with  . Hypertension    HPI  Pt is here for follow up on her blood pressure.  She reports she has been very busy and has not been able to remember to take her medications regularly.  She reports she has noticed blurred vision, but denies chest pain or palpitations.  Her BP today was 143/101   Current Medication: Outpatient Encounter Medications as of 12/18/2018  Medication Sig  . citalopram (CELEXA) 10 MG tablet Take 10 mg by mouth. Take 2 tab daily  . hydrochlorothiazide (HYDRODIURIL) 12.5 MG tablet Take 1 tablet (12.5 mg total) by mouth daily.  Marland Kitchen HYDROcodone-acetaminophen (NORCO/VICODIN) 5-325 MG tablet Take 1 tablet by mouth 3 (three) times daily as needed for moderate pain.  Marland Kitchen linaclotide (LINZESS) 290 MCG CAPS capsule Take 290 mcg by mouth daily before breakfast.  . losartan (COZAAR) 25 MG tablet Take 2 tablets (50 mg total) by mouth daily.  . naproxen sodium (ANAPROX DS) 550 MG tablet Take 1 tablet (550 mg total) by mouth 2 (two) times daily with a meal.  . omeprazole (PRILOSEC) 40 MG capsule Take 40 mg by mouth daily.  . polyethylene glycol powder (GLYCOLAX/MIRALAX) 17 GM/SCOOP powder Take 17 g by mouth daily as needed for moderate constipation.  Marland Kitchen tiZANidine (ZANAFLEX) 2 MG tablet Take 1 tablet (2 mg total) by mouth 2 (two) times daily as needed for muscle spasms.   No facility-administered encounter medications on file as of 12/18/2018.     Surgical History: Past Surgical History:  Procedure Laterality Date  . CRYOABLATION  2010  . TUBAL LIGATION  2007  . VAGINAL HYSTERECTOMY  2013   Dr. Logan Bores [facility])    Medical History: Past Medical History:  Diagnosis Date  . Hypertension   . IBS (irritable bowel syndrome)   . Kidney stone     Family  History: Family History  Problem Relation Age of Onset  . Diabetes Mother   . Hypertension Mother   . Diabetes Father   . Hypertension Father   . Bladder Cancer Neg Hx   . Kidney cancer Neg Hx     Social History   Socioeconomic History  . Marital status: Single    Spouse name: Not on file  . Number of children: Not on file  . Years of education: Not on file  . Highest education level: Not on file  Occupational History  . Not on file  Social Needs  . Financial resource strain: Not on file  . Food insecurity    Worry: Not on file    Inability: Not on file  . Transportation needs    Medical: Not on file    Non-medical: Not on file  Tobacco Use  . Smoking status: Never Smoker  . Smokeless tobacco: Never Used  Substance and Sexual Activity  . Alcohol use: No  . Drug use: No  . Sexual activity: Yes    Birth control/protection: Surgical  Lifestyle  . Physical activity    Days per week: Not on file    Minutes per session: Not on file  . Stress: Not on file  Relationships  . Social Musician on phone: Not on file    Gets together: Not on file    Attends religious service: Not on  file    Active member of club or organization: Not on file    Attends meetings of clubs or organizations: Not on file    Relationship status: Not on file  . Intimate partner violence    Fear of current or ex partner: Not on file    Emotionally abused: Not on file    Physically abused: Not on file    Forced sexual activity: Not on file  Other Topics Concern  . Not on file  Social History Narrative  . Not on file      Review of Systems  Constitutional: Negative for chills, fatigue and unexpected weight change.  HENT: Negative for congestion, rhinorrhea, sneezing and sore throat.   Eyes: Negative for photophobia, pain and redness.  Respiratory: Negative for cough, chest tightness and shortness of breath.   Cardiovascular: Negative for chest pain and palpitations.   Gastrointestinal: Negative for abdominal pain, constipation, diarrhea, nausea and vomiting.  Endocrine: Negative.   Genitourinary: Negative for dysuria and frequency.  Musculoskeletal: Negative for arthralgias, back pain, joint swelling and neck pain.  Skin: Negative for rash.  Allergic/Immunologic: Negative.   Neurological: Negative for tremors and numbness.  Hematological: Negative for adenopathy. Does not bruise/bleed easily.  Psychiatric/Behavioral: Negative for behavioral problems and sleep disturbance. The patient is not nervous/anxious.     Vital Signs: BP (!) 143/101   Pulse 81   Temp (!) 97.3 F (36.3 C)   Resp 16   Ht  (1.626 m)   Wt 178 lb (80.7 kg)   SpO2 99%   BMI 30.55 kg/m    Physical Exam Vitals signs and nursing note reviewed.  Constitutional:      General: She is not in acute distress.    Appearance: She is well-developed. She is not diaphoretic.  HENT:     Head: Normocephalic and atraumatic.     Mouth/Throat:     Pharynx: No oropharyngeal exudate.  Eyes:     Pupils: Pupils are equal, round, and reactive to light.  Neck:     Musculoskeletal: Normal range of motion and neck supple.     Thyroid: No thyromegaly.     Vascular: No JVD.     Trachea: No tracheal deviation.  Cardiovascular:     Rate and Rhythm: Normal rate and regular rhythm.     Heart sounds: Normal heart sounds. No murmur. No friction rub. No gallop.   Pulmonary:     Effort: Pulmonary effort is normal. No respiratory distress.     Breath sounds: Normal breath sounds. No wheezing or rales.  Chest:     Chest wall: No tenderness.  Abdominal:     Palpations: Abdomen is soft.     Tenderness: There is no abdominal tenderness. There is no guarding.  Musculoskeletal: Normal range of motion.  Lymphadenopathy:     Cervical: No cervical adenopathy.  Skin:    General: Skin is warm and dry.  Neurological:     Mental Status: She is alert and oriented to person, place, and time.      Cranial Nerves: No cranial nerve deficit.  Psychiatric:        Behavior: Behavior normal.        Thought Content: Thought content normal.        Judgment: Judgment normal.     Assessment/Plan: 1. Essential hypertension Elevated, We discussed importance of HTN medication compliance. She verbalized understanding, and we discussed long term complications of untreated HTN.     2. Constipation, unspecified constipation  type Stable, continue present management.  3. Poor compliance with medication Discussed dangers of HTN, and poor medication compliance.   General Counseling: Geralynn verbalizes understanding of the findings of todays visit and agrees with plan of treatment. I have discussed any further diagnostic evaluation that may be needed or ordered today. We also reviewed her medications today. she has been encouraged to call the office with any questions or concerns that should arise related to todays visit.    Orders Placed This Encounter  Procedures  . MM DIGITAL SCREENING BILATERAL    No orders of the defined types were placed in this encounter.   Time spent: 25 Minutes   This patient was seen by Orson Gear AGNP-C in Collaboration with Dr Lavera Guise as a part of collaborative care agreement     Kendell Bane AGNP-C Internal medicine

## 2019-01-03 LAB — HM MAMMOGRAPHY: HM Mammogram: ABNORMAL — AB (ref 0–4)

## 2019-01-09 ENCOUNTER — Other Ambulatory Visit: Payer: Self-pay | Admitting: Adult Health

## 2019-01-09 DIAGNOSIS — I10 Essential (primary) hypertension: Secondary | ICD-10-CM

## 2019-01-09 MED ORDER — HYDROCHLOROTHIAZIDE 12.5 MG PO TABS: 12.5000 mg | ORAL_TABLET | Freq: Every day | ORAL | 3 refills | Status: DC

## 2019-01-22 NOTE — Addendum Note (Signed)
Addended byVersie Starks, Alyissa Whidbee J on: 01/22/2019 10:59 AM   Modules accepted: Level of Service

## 2019-02-21 ENCOUNTER — Other Ambulatory Visit: Payer: Self-pay

## 2019-02-21 DIAGNOSIS — I10 Essential (primary) hypertension: Secondary | ICD-10-CM

## 2019-02-21 MED ORDER — LOSARTAN POTASSIUM 25 MG PO TABS: 50.0000 mg | ORAL_TABLET | Freq: Every day | ORAL | 3 refills | Status: DC

## 2019-02-25 ENCOUNTER — Ambulatory Visit: Payer: Medicaid Other

## 2019-02-25 ENCOUNTER — Ambulatory Visit: Payer: Self-pay

## 2019-02-25 ENCOUNTER — Other Ambulatory Visit: Payer: Self-pay

## 2019-02-25 DIAGNOSIS — Z111 Encounter for screening for respiratory tuberculosis: Secondary | ICD-10-CM

## 2019-02-25 DIAGNOSIS — Z23 Encounter for immunization: Secondary | ICD-10-CM

## 2019-02-25 NOTE — Progress Notes (Signed)
[facility]  records reviewed.  Patient will contact [facility] and [facility] for IMM records.  Tdap and PPD given today; tolerated well. See PPD document.  Patient given RN contact info; will f/u with RN once IMM records received. Aileen Fass, RN

## 2019-02-27 ENCOUNTER — Other Ambulatory Visit: Payer: Self-pay

## 2019-02-27 ENCOUNTER — Encounter: Payer: Self-pay | Admitting: Nurse Practitioner

## 2019-02-27 ENCOUNTER — Ambulatory Visit: Payer: Medicaid Other | Admitting: Nurse Practitioner

## 2019-02-27 VITALS — BP 130/89 | HR 77 | Resp 16 | Ht 64.0 in | Wt 180.0 lb

## 2019-02-27 DIAGNOSIS — Z020 Encounter for examination for admission to educational institution: Secondary | ICD-10-CM

## 2019-02-27 DIAGNOSIS — I10 Essential (primary) hypertension: Secondary | ICD-10-CM | POA: Diagnosis not present

## 2019-02-27 DIAGNOSIS — R760 Raised antibody titer: Secondary | ICD-10-CM | POA: Diagnosis not present

## 2019-02-27 DIAGNOSIS — R300 Dysuria: Secondary | ICD-10-CM | POA: Diagnosis not present

## 2019-02-27 NOTE — Progress Notes (Signed)
[facility] [address] [place], [place] [address]  Internal MEDICINE  Office Visit Note  Patient Name: Ashley Mcpherson  [id number]  [id number]  Date of Service: 03/02/2019   Pt is here for routine health maintenance examination   Chief Complaint  Patient presents with  . Annual Exam    needs paperwork for school filled out , needs titers for immunizations   . Hypertension     The patient presents for physical exam which she needs to have prior to starting clinical rotation for college classes. 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 She does need to have titers drawn for measles, mumps, rubella, varicella, and hepatitis B. She did [facility] TB skin test [facility]. She states that she feels well and has no concerns or complaints today. She does need to have refills for her blood pressure medication today.   Current Medication: Outpatient Encounter Medications as of 02/27/2019  Medication Sig  . citalopram (CELEXA) 10 MG tablet Take 10 mg by mouth. Take 2 tab daily  . hydrochlorothiazide (HYDRODIURIL) 12.5 MG tablet Take 1 tablet (12.5 mg total) by mouth daily.  Marland Kitchen HYDROcodone-acetaminophen (NORCO/VICODIN) 5-325 MG tablet Take 1 tablet by mouth 3 (three) times daily as needed for moderate pain.  Marland Kitchen linaclotide (LINZESS) 290 MCG CAPS capsule Take 290 mcg by mouth daily before breakfast.  . losartan (COZAAR) 25 MG tablet Take 2 tablets (50 mg total) by mouth daily.  . naproxen sodium (ANAPROX DS) 550 MG tablet Take 1 tablet (550 mg total) by mouth 2 (two) times daily with a meal.  . omeprazole (PRILOSEC) 40 MG capsule Take 40 mg by mouth daily.  . polyethylene glycol powder (GLYCOLAX/MIRALAX) 17 GM/SCOOP powder Take 17 g by mouth daily as needed for moderate constipation.  Marland Kitchen tiZANidine (ZANAFLEX) 2 MG tablet Take 1 tablet (2 mg total) by mouth 2 (two) times daily as needed for muscle  spasms.   No facility-administered encounter medications on file as of 02/27/2019.    Surgical History: Past Surgical History:  Procedure Laterality Date  . CRYOABLATION  2010  . TUBAL LIGATION  2007  . VAGINAL HYSTERECTOMY  2013   Dr. Logan Bores [facility])    Medical History: Past Medical History:  Diagnosis Date  . Hypertension   . IBS (irritable bowel syndrome)   . Kidney stone     Family History: Family History  Problem Relation Age of Onset  . Diabetes Mother   . Hypertension Mother   . Diabetes Father   . Hypertension Father   . Bladder Cancer Neg Hx   . Kidney cancer Neg Hx       Review of Systems  Constitutional: Negative for activity change, chills, fatigue and unexpected weight change.  HENT: Negative for congestion, rhinorrhea, sneezing and sore throat.   Eyes: Negative for visual disturbance.  Respiratory: Negative for cough, chest tightness, shortness of breath and wheezing.   Cardiovascular: Negative for chest pain and palpitations.  Gastrointestinal: Negative for abdominal pain, constipation, diarrhea, nausea and vomiting.  Endocrine: Negative for cold intolerance, heat intolerance, polydipsia and polyuria.  Genitourinary: Negative for dysuria and frequency.  Musculoskeletal: Negative for arthralgias, back pain, joint swelling and neck pain.  Skin: Negative for rash.  Allergic/Immunologic: Negative for environmental allergies.  Neurological: Negative for dizziness, tremors, numbness and headaches.  Hematological: Negative for adenopathy. Does not bruise/bleed easily.  Psychiatric/Behavioral: Negative for behavioral problems and sleep disturbance. The patient  is not nervous/anxious.      Today's Vitals   02/27/19 1504  BP: 130/89  Pulse: 77  Resp: 16  SpO2: 99%  Weight: 180 lb (81.6 kg)  Height: 5\' 4"  (1.626 m)   Body mass index is 30.9 kg/m.  Physical Exam Vitals and nursing note reviewed.  Constitutional:      General: She is not in acute  distress.    Appearance: Normal appearance. She is well-developed. She is not diaphoretic.  HENT:     Head: Normocephalic and atraumatic.     Nose: Nose normal.     Mouth/Throat:     Pharynx: No oropharyngeal exudate.  Eyes:     General: Lids are normal. Vision grossly intact.     Pupils: Pupils are equal, round, and reactive to light.     Comments: Vision 20/15, bilaterally, corrected .  Neck:     Thyroid: No thyromegaly.     Vascular: No JVD.     Trachea: No tracheal deviation.  Cardiovascular:     Rate and Rhythm: Normal rate and regular rhythm.     Heart sounds: Normal heart sounds. No murmur. No friction rub. No gallop.   Pulmonary:     Effort: Pulmonary effort is normal. No respiratory distress.     Breath sounds: Normal breath sounds. No wheezing or rales.  Chest:     Chest wall: No tenderness.  Abdominal:     General: Bowel sounds are normal.     Palpations: Abdomen is soft.     Tenderness: There is no abdominal tenderness.  Musculoskeletal:        General: Normal range of motion.     Cervical back: Normal range of motion and neck supple.  Lymphadenopathy:     Cervical: No cervical adenopathy.  Skin:    General: Skin is warm and dry.  Neurological:     Mental Status: She is alert and oriented to person, place, and time.     Cranial Nerves: No cranial nerve deficit.  Psychiatric:        Behavior: Behavior normal.        Thought Content: Thought content normal.        Judgment: Judgment normal.    LABS: Recent Results (from the past 2160 hour(s))  UA/M w/rflx Culture, Routine     Status: Abnormal   Collection Time: 02/27/19  3:03 PM   Specimen: Urine   URINE  Result Value Ref Range   Specific Gravity, UA 1.018 1.005 - 1.030   pH, UA 6.5 5.0 - 7.5   Color, UA Yellow Yellow   Appearance Ur Cloudy (A) Clear   Leukocytes,UA Negative Negative   Protein,UA Negative Negative/Trace   Glucose, UA Negative Negative   Ketones, UA Negative Negative   RBC, UA  Negative Negative   Bilirubin, UA Negative Negative   Urobilinogen, Ur 0.2 0.2 - 1.0 mg/dL   Nitrite, UA Negative Negative   Microscopic Examination Comment     Comment: Microscopic follows if indicated.   Microscopic Examination See below:     Comment: Microscopic was indicated and was performed.   Urinalysis Reflex Comment     Comment: This specimen will not reflex to a Urine Culture.  Microscopic Examination     Status: Abnormal   Collection Time: 02/27/19  3:03 PM   URINE  Result Value Ref Range   WBC, UA 0-5 0 - 5 /hpf   RBC None seen 0 - 2 /hpf   Epithelial Cells (non renal) >  10 (A) 0 - 10 /hpf   Casts None seen None seen /lpf   Mucus, UA Present Not Estab.   Bacteria, UA Few None seen/Few  PPD     Status: Normal   Collection Time: 02/28/19  2:48 PM  Result Value Ref Range   TB Skin Test Negative    Induration 0 mm    .Assessment/Plan: 1. Encounter for school history and physical examination Physical exam for entry into clinical portion of histotechnology program.  2. Essential hypertension Stable. Continue bp medication as prescribed   3. 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 High DNase B antibody titer Labs ordered to hepatitis B titer for clinical program admission  5. High measles virus antibody titer Ordered measles titer for clinical program admission.  6. Dysuria - UA/M w/rflx Culture, Routine  General Counseling: Bonni verbalizes understanding of the findings of todays visit and agrees with plan of treatment. I have discussed any further diagnostic evaluation that may be needed or ordered today. We also reviewed her medications today. she has been encouraged to call the office with any questions or concerns that should arise related to todays visit.    Counseling:  This patient was seen by Leretha Pol FNP Collaboration with Dr Lavera Guise as a part of collaborative care  agreement  Orders Placed This Encounter  Procedures  . Microscopic Examination  . UA/M w/rflx Culture, Routine     Time spent: [place], MD  Internal Medicine

## 2019-02-28 ENCOUNTER — Other Ambulatory Visit: Payer: Medicaid Other

## 2019-02-28 ENCOUNTER — Ambulatory Visit (LOCAL_COMMUNITY_HEALTH_CENTER): Payer: Self-pay

## 2019-02-28 ENCOUNTER — Other Ambulatory Visit: Payer: Self-pay | Admitting: Nurse Practitioner

## 2019-02-28 DIAGNOSIS — Z111 Encounter for screening for respiratory tuberculosis: Secondary | ICD-10-CM

## 2019-02-28 LAB — TB SKIN TEST
Induration: 0 mm
TB Skin Test: NEGATIVE

## 2019-02-28 LAB — UA/M W/RFLX CULTURE, ROUTINE
Bilirubin, UA: NEGATIVE
Glucose, UA: NEGATIVE
Ketones, UA: NEGATIVE
Leukocytes,UA: NEGATIVE
Nitrite, UA: NEGATIVE
Protein,UA: NEGATIVE
RBC, UA: NEGATIVE
Specific Gravity, UA: 1.018 (ref 1.005–1.030)
Urobilinogen, Ur: 0.2 mg/dL (ref 0.2–1.0)
pH, UA: 6.5 (ref 5.0–7.5)

## 2019-02-28 LAB — MICROSCOPIC EXAMINATION
Casts: NONE SEEN /lpf
Epithelial Cells (non renal): >10 /hpf — AB (ref 0–10)
RBC, Urine: NONE SEEN /hpf (ref 0–2)

## 2019-02-28 LAB — HEPATITIS B SURFACE ANTIGEN: Hepatitis B Surface Ag: REACTIVE

## 2019-03-02 DIAGNOSIS — R760 Raised antibody titer: Secondary | ICD-10-CM | POA: Insufficient documentation

## 2019-03-02 DIAGNOSIS — Z020 Encounter for examination for admission to educational institution: Secondary | ICD-10-CM | POA: Insufficient documentation

## 2019-03-03 LAB — VITAMIN D 25 HYDROXY (VIT D DEFICIENCY, FRACTURES): Vit D, 25-Hydroxy: 14.4 ng/mL — ABNORMAL LOW (ref 30.0–100.0)

## 2019-03-03 LAB — MEASLES/MUMPS/RUBELLA IMMUNITY
MUMPS ABS, IGG: 42.3 AU/mL (ref >10.9)
RUBEOLA AB, IGG: 62.8 AU/mL (ref >16.4)
Rubella Antibodies, IGG: 6.99 index (ref >0.99)

## 2019-03-03 LAB — COMPREHENSIVE METABOLIC PANEL
ALT: 12 IU/L (ref 0–32)
AST: 15 IU/L (ref 0–40)
Albumin/Globulin Ratio: 1.2 (ref 1.2–2.2)
Albumin: 3.7 g/dL — ABNORMAL LOW (ref 3.8–4.8)
Alkaline Phosphatase: 47 IU/L (ref 39–117)
BUN/Creatinine Ratio: 10 (ref 9–23)
BUN: 9 mg/dL (ref 6–24)
Bilirubin Total: 0.5 mg/dL (ref 0.0–1.2)
CO2: 21 mmol/L (ref 20–29)
Calcium: 8.7 mg/dL (ref 8.7–10.2)
Chloride: 105 mmol/L (ref 96–106)
Creatinine, Ser: 0.91 mg/dL (ref 0.57–1.00)
GFR calc Af Amer: 89 mL/min/{1.73_m2} (ref >59)
GFR calc non Af Amer: 77 mL/min/{1.73_m2} (ref >59)
Globulin, Total: 3.2 g/dL (ref 1.5–4.5)
Glucose: 92 mg/dL (ref 65–99)
Potassium: 4 mmol/L (ref 3.5–5.2)
Sodium: 137 mmol/L (ref 134–144)
Total Protein: 6.9 g/dL (ref 6.0–8.5)

## 2019-03-03 LAB — TSH: TSH: 0.878 u[IU]/mL (ref 0.450–4.500)

## 2019-03-03 LAB — CBC
Hematocrit: 32.9 % — ABNORMAL LOW (ref 34.0–46.6)
Hemoglobin: 10.3 g/dL — ABNORMAL LOW (ref 11.1–15.9)
MCH: 23.8 pg — ABNORMAL LOW (ref 26.6–33.0)
MCHC: 31.3 g/dL — ABNORMAL LOW (ref 31.5–35.7)
MCV: 76 fL — ABNORMAL LOW (ref 79–97)
Platelets: 317 10*3/uL (ref 150–450)
RBC: 4.33 x10E6/uL (ref 3.77–5.28)
RDW: 15.3 % (ref 11.7–15.4)
WBC: 6.3 10*3/uL (ref 3.4–10.8)

## 2019-03-03 LAB — LIPID PANEL W/O CHOL/HDL RATIO
Cholesterol, Total: 222 mg/dL — ABNORMAL HIGH (ref 100–199)
HDL: 56 mg/dL (ref >39)
LDL Chol Calc (NIH): 152 mg/dL — ABNORMAL HIGH (ref 0–99)
Triglycerides: 79 mg/dL (ref 0–149)
VLDL Cholesterol Cal: 14 mg/dL (ref 5–40)

## 2019-03-03 LAB — VARICELLA ZOSTER ABS, IGG/IGM
Varicella IgM: <0.91 index (ref 0.00–0.90)
Varicella zoster IgG: 523 index (ref >165)

## 2019-03-03 LAB — HEPATITIS B SURFACE ANTIBODY,QUALITATIVE: Hep B Surface Ab, Qual: REACTIVE

## 2019-03-03 LAB — T4, FREE: Free T4: 1.17 ng/dL (ref 0.82–1.77)

## 2019-03-05 ENCOUNTER — Telehealth: Payer: Self-pay

## 2019-03-05 NOTE — Telephone Encounter (Signed)
STUDENT MEDICAL FORM COMPLETED BY PROVIDER AND READY FOR PICKUP AT FRONT DESK. PATIENT ADVISED.

## 2019-03-05 NOTE — Progress Notes (Signed)
Lab results printed out for patient. Immunizations transcribed to student physical form and returned to patient.

## 2019-03-18 ENCOUNTER — Ambulatory Visit (LOCAL_COMMUNITY_HEALTH_CENTER): Payer: Medicaid Other

## 2019-03-18 ENCOUNTER — Other Ambulatory Visit: Payer: Self-pay

## 2019-03-18 ENCOUNTER — Ambulatory Visit (LOCAL_COMMUNITY_HEALTH_CENTER): Payer: Self-pay

## 2019-03-18 DIAGNOSIS — Z111 Encounter for screening for respiratory tuberculosis: Secondary | ICD-10-CM

## 2019-03-18 DIAGNOSIS — Z23 Encounter for immunization: Secondary | ICD-10-CM

## 2019-03-21 ENCOUNTER — Ambulatory Visit (LOCAL_COMMUNITY_HEALTH_CENTER): Payer: Medicaid Other

## 2019-03-21 ENCOUNTER — Other Ambulatory Visit: Payer: Self-pay

## 2019-03-21 DIAGNOSIS — Z111 Encounter for screening for respiratory tuberculosis: Secondary | ICD-10-CM

## 2019-03-21 LAB — TB SKIN TEST
Induration: 0 mm
TB Skin Test: NEGATIVE

## 2019-04-21 ENCOUNTER — Ambulatory Visit (INDEPENDENT_AMBULATORY_CARE_PROVIDER_SITE_OTHER): Payer: Medicaid Other | Admitting: Adult Health

## 2019-04-21 ENCOUNTER — Encounter: Payer: Self-pay | Admitting: Adult Health

## 2019-04-21 VITALS — BP 151/98 | HR 77 | Ht 64.0 in | Wt 180.0 lb

## 2019-04-21 DIAGNOSIS — I10 Essential (primary) hypertension: Secondary | ICD-10-CM | POA: Diagnosis not present

## 2019-04-21 DIAGNOSIS — N393 Stress incontinence (female) (male): Secondary | ICD-10-CM | POA: Diagnosis not present

## 2019-04-21 DIAGNOSIS — N3090 Cystitis, unspecified without hematuria: Secondary | ICD-10-CM | POA: Diagnosis not present

## 2019-04-21 MED ORDER — NITROFURANTOIN MONOHYD MACRO 100 MG PO CAPS: 100.0000 mg | ORAL_CAPSULE | Freq: Two times a day (BID) | ORAL | 0 refills | Status: DC

## 2019-04-21 NOTE — Progress Notes (Signed)
[facility] [address] [place], [place] [address]  Internal MEDICINE  Telephone Visit  Patient Name: Ashley Mcpherson  [id number]  [id number]  Date of Service: 04/21/2019  I connected with the patient at 152 by telephone and verified the patients identity using two identifiers.   I discussed the limitations, risks, security and privacy concerns of performing an evaluation and management service by telephone and the availability of in person appointments. I also discussed with the patient that there may be a patient responsible charge related to the service.  The patient expressed understanding and agrees to proceed.    Chief Complaint  Patient presents with  . Telephone Assessment  . Telephone Screen  . Urinary Tract Infection    FREQUENT URINATION   . Urinary Incontinence    HPI  Pt seen via video.  She reports she has been having frequent urination, bladder pressure, and some incontinence.  She does report a slight odor as well.  Denies fever or flank pain. She is just recovering from Covid, and has been out of isolation since 04/17/19.  She denies any other symptoms.  She reports her last UTI was more than 6 months ago.     Current Medication: Outpatient Encounter Medications as of 04/21/2019  Medication Sig  . citalopram (CELEXA) 10 MG tablet Take 10 mg by mouth. Take 2 tab daily  . hydrochlorothiazide (HYDRODIURIL) 12.5 MG tablet Take 1 tablet (12.5 mg total) by mouth daily.  Marland Kitchen HYDROcodone-acetaminophen (NORCO/VICODIN) 5-325 MG tablet Take 1 tablet by mouth 3 (three) times daily as needed for moderate pain.  Marland Kitchen linaclotide (LINZESS) 290 MCG CAPS capsule Take 290 mcg by mouth daily before breakfast.  . losartan (COZAAR) 25 MG tablet Take 2 tablets (50 mg total) by mouth daily.  . naproxen sodium (ANAPROX DS) 550 MG tablet Take 1 tablet (550 mg total) by mouth 2 (two) times daily with a meal.  . omeprazole (PRILOSEC) 40 MG capsule Take 40 mg by mouth daily.  .  polyethylene glycol powder (GLYCOLAX/MIRALAX) 17 GM/SCOOP powder Take 17 g by mouth daily as needed for moderate constipation.  Marland Kitchen tiZANidine (ZANAFLEX) 2 MG tablet Take 1 tablet (2 mg total) by mouth 2 (two) times daily as needed for muscle spasms.  . nitrofurantoin, macrocrystal-monohydrate, (MACROBID) 100 MG capsule Take 1 capsule (100 mg total) by mouth 2 (two) times daily.   No facility-administered encounter medications on file as of 04/21/2019.    Surgical History: Past Surgical History:  Procedure Laterality Date  . CRYOABLATION  2010  . TUBAL LIGATION  2007  . VAGINAL HYSTERECTOMY  2013   Dr. Logan Bores [facility])    Medical History: Past Medical History:  Diagnosis Date  . Hypertension   . IBS (irritable bowel syndrome)   . Kidney stone     Family History: Family History  Problem Relation Age of Onset  . Diabetes Mother   . Hypertension Mother   . Diabetes Father   . Hypertension Father   . Bladder Cancer Neg Hx   . Kidney cancer Neg Hx     Social History   Socioeconomic History  . Marital status: Single    Spouse name: Not on file  . Number of children: Not on file  . Years of education: Not on file  . Highest education level: Not on file  Occupational History  . Not on file  Tobacco Use  . Smoking status: Never Smoker  . Smokeless tobacco: Never Used  Substance and Sexual Activity  .  Alcohol use: No  . Drug use: No  . Sexual activity: Yes    Birth control/protection: Surgical  Other Topics Concern  . Not on file  Social History Narrative  . Not on file   Social Determinants of Health   Financial Resource Strain:   . Difficulty of Paying Living Expenses: Not on file  Food Insecurity:   . Worried About Charity fundraiser in the Last Year: Not on file  . Ran Out of Food in the Last Year: Not on file  Transportation Needs:   . Lack of Transportation (Medical): Not on file  . Lack of Transportation (Non-Medical): Not on file  Physical Activity:   .  Days of Exercise per Week: Not on file  . Minutes of Exercise per Session: Not on file  Stress:   . Feeling of Stress : Not on file  Social Connections:   . Frequency of Communication with Friends and Family: Not on file  . Frequency of Social Gatherings with Friends and Family: Not on file  . Attends Religious Services: Not on file  . Active Member of Clubs or Organizations: Not on file  . Attends Archivist Meetings: Not on file  . Marital Status: Not on file  Intimate Partner Violence:   . Fear of Current or Ex-Partner: Not on file  . Emotionally Abused: Not on file  . Physically Abused: Not on file  . Sexually Abused: Not on file      Review of Systems  Constitutional: Negative for chills, fatigue and unexpected weight change.  HENT: Negative for congestion, rhinorrhea, sneezing and sore throat.   Eyes: Negative for photophobia, pain and redness.  Respiratory: Negative for cough, chest tightness and shortness of breath.   Cardiovascular: Negative for chest pain and palpitations.  Gastrointestinal: Negative for abdominal pain, constipation, diarrhea, nausea and vomiting.  Endocrine: Negative.   Genitourinary: Positive for dysuria, frequency and urgency.  Musculoskeletal: Negative for arthralgias, back pain, joint swelling and neck pain.  Skin: Negative for rash.  Allergic/Immunologic: Negative.   Neurological: Negative for tremors and numbness.  Hematological: Negative for adenopathy. Does not bruise/bleed easily.  Psychiatric/Behavioral: Negative for behavioral problems and sleep disturbance. The patient is not nervous/anxious.     Vital Signs: BP (!) 151/98   Pulse 77   Ht 5\' 4"  (1.626 m)   Wt 180 lb (81.6 kg)   BMI 30.90 kg/m    Observation/Objective:  Well appearing, NAD noted at this time.    Assessment/Plan: 1. Cystitis Advised patient to take entire course of antibiotics as prescribed with food. Pt should return to clinic in 7-10 days if  symptoms fail to improve or new symptoms develop.  - nitrofurantoin, macrocrystal-monohydrate, (MACROBID) 100 MG capsule; Take 1 capsule (100 mg total) by mouth 2 (two) times daily.  Dispense: 14 capsule; Refill: 0  2. Stress incontinence of urine Pt reports detriment to daily life, and would like to see urology.  She was told in the past her bladder had fallen.  - Ambulatory referral to Urology  3. Essential hypertension Controlled, continue current medications.   General Counseling: Ashley Mcpherson verbalizes understanding of the findings of today's phone visit and agrees with plan of treatment. I have discussed any further diagnostic evaluation that may be needed or ordered today. We also reviewed her medications today. she has been encouraged to call the office with any questions or concerns that should arise related to todays visit.    Orders Placed This Encounter  Procedures  .  Ambulatory referral to Urology    Meds ordered this encounter  Medications  . nitrofurantoin, macrocrystal-monohydrate, (MACROBID) 100 MG capsule    Sig: Take 1 capsule (100 mg total) by mouth 2 (two) times daily.    Dispense:  14 capsule    Refill:  0    Time spent: 20 Minutes   Blima Ledger AGNP-C Internal medicine

## 2019-04-22 ENCOUNTER — Ambulatory Visit: Payer: Medicaid Other | Admitting: Nurse Practitioner

## 2019-07-18 ENCOUNTER — Telehealth: Payer: Self-pay

## 2019-07-18 NOTE — Telephone Encounter (Signed)
Confirmed appointment on 07/22/2019 and screened for covid. klh °

## 2019-07-22 ENCOUNTER — Ambulatory Visit: Payer: Medicaid Other | Admitting: Adult Health

## 2019-07-22 ENCOUNTER — Other Ambulatory Visit: Payer: Self-pay

## 2019-07-22 ENCOUNTER — Encounter: Payer: Self-pay | Admitting: Adult Health

## 2019-07-22 VITALS — BP 140/89 | HR 77 | Temp 97.4°F | Resp 16 | Ht 64.0 in | Wt 181.4 lb

## 2019-07-22 DIAGNOSIS — K5900 Constipation, unspecified: Secondary | ICD-10-CM

## 2019-07-22 DIAGNOSIS — N200 Calculus of kidney: Secondary | ICD-10-CM | POA: Diagnosis not present

## 2019-07-22 DIAGNOSIS — Z9114 Patient's other noncompliance with medication regimen: Secondary | ICD-10-CM

## 2019-07-22 DIAGNOSIS — I10 Essential (primary) hypertension: Secondary | ICD-10-CM | POA: Diagnosis not present

## 2019-07-22 NOTE — Progress Notes (Signed)
[facility] [address] [place], [place] [address]  Internal MEDICINE  Office Visit Note  Patient Name: Ashley Mcpherson  [id number]  [id number]  Date of Service: 07/22/2019  Chief Complaint  Patient presents with  . Follow-up  . Hypertension    HPI  Pt is here for follow up on HTN.  She reports she often forgets to take her bp meds.  She is stressed with school, and in fact has her final exam today. Her blood pressure today is 140/89.  She denies any pain or need at this time.  She was recently seen in the ER at [facility] for flank pain.  She has multiple stones in both her kidneys, however they remain in the kidney with no evidence of movement or issues.     Current Medication: Outpatient Encounter Medications as of 07/22/2019  Medication Sig  . hydrochlorothiazide (HYDRODIURIL) 12.5 MG tablet Take 1 tablet (12.5 mg total) by mouth daily.  Marland Kitchen HYDROcodone-acetaminophen (NORCO/VICODIN) 5-325 MG tablet Take 1 tablet by mouth 3 (three) times daily as needed for moderate pain.  Marland Kitchen linaclotide (LINZESS) 290 MCG CAPS capsule Take 290 mcg by mouth daily before breakfast.  . losartan (COZAAR) 25 MG tablet Take 2 tablets (50 mg total) by mouth daily.  . naproxen sodium (ANAPROX DS) 550 MG tablet Take 1 tablet (550 mg total) by mouth 2 (two) times daily with a meal.  . omeprazole (PRILOSEC) 40 MG capsule Take 40 mg by mouth daily.  . polyethylene glycol powder (GLYCOLAX/MIRALAX) 17 GM/SCOOP powder Take 17 g by mouth daily as needed for moderate constipation.  Marland Kitchen tiZANidine (ZANAFLEX) 2 MG tablet Take 1 tablet (2 mg total) by mouth 2 (two) times daily as needed for muscle spasms.  . [DISCONTINUED] citalopram (CELEXA) 10 MG tablet Take 10 mg by mouth. Take 2 tab daily  . [DISCONTINUED] nitrofurantoin, macrocrystal-monohydrate, (MACROBID) 100 MG capsule Take 1 capsule (100 mg total) by mouth 2 (two) times daily.   No facility-administered encounter medications on file as of  07/22/2019.    Surgical History: Past Surgical History:  Procedure Laterality Date  . CRYOABLATION  2010  . TUBAL LIGATION  2007  . VAGINAL HYSTERECTOMY  2013   Dr. Logan Bores [facility])    Medical History: Past Medical History:  Diagnosis Date  . Hypertension   . IBS (irritable bowel syndrome)   . Kidney stone     Family History: Family History  Problem Relation Age of Onset  . Diabetes Mother   . Hypertension Mother   . Diabetes Father   . Hypertension Father   . Bladder Cancer Neg Hx   . Kidney cancer Neg Hx     Social History   Socioeconomic History  . Marital status: Single    Spouse name: Not on file  . Number of children: Not on file  . Years of education: Not on file  . Highest education level: Not on file  Occupational History  . Not on file  Tobacco Use  . Smoking status: Never Smoker  . Smokeless tobacco: Never Used  Substance and Sexual Activity  . Alcohol use: No  . Drug use: No  . Sexual activity: Yes    Birth control/protection: Surgical  Other Topics Concern  . Not on file  Social History Narrative  . Not on file   Social Determinants of Health   Financial Resource Strain:   . Difficulty of Paying Living Expenses:   Food Insecurity:   . Worried About Running  Out of Food in the Last Year:   . Ran Out of Food in the Last Year:   Transportation Needs:   . Lack of Transportation (Medical):   Marland Kitchen Lack of Transportation (Non-Medical):   Physical Activity:   . Days of Exercise per Week:   . Minutes of Exercise per Session:   Stress:   . Feeling of Stress :   Social Connections:   . Frequency of Communication with Friends and Family:   . Frequency of Social Gatherings with Friends and Family:   . Attends Religious Services:   . Active Member of Clubs or Organizations:   . Attends Banker Meetings:   Marland Kitchen Marital Status:   Intimate Partner Violence:   . Fear of Current or Ex-Partner:   . Emotionally Abused:   Marland Kitchen Physically Abused:    . Sexually Abused:       Review of Systems  Constitutional: Negative for chills, fatigue and unexpected weight change.  HENT: Negative for congestion, rhinorrhea, sneezing and sore throat.   Eyes: Negative for photophobia, pain and redness.  Respiratory: Negative for cough, chest tightness and shortness of breath.   Cardiovascular: Negative for chest pain and palpitations.  Gastrointestinal: Negative for abdominal pain, constipation, diarrhea, nausea and vomiting.  Endocrine: Negative.   Genitourinary: Negative for dysuria and frequency.  Musculoskeletal: Negative for arthralgias, back pain, joint swelling and neck pain.  Skin: Negative for rash.  Allergic/Immunologic: Negative.   Neurological: Negative for tremors and numbness.  Hematological: Negative for adenopathy. Does not bruise/bleed easily.  Psychiatric/Behavioral: Negative for behavioral problems and sleep disturbance. The patient is not nervous/anxious.     Vital Signs: BP 140/89   Pulse 77   Temp (!) 97.4 F (36.3 C)   Resp 16   Ht 5\' 4"  (1.626 m)   Wt 181 lb 6.4 oz (82.3 kg)   SpO2 99%   BMI 31.14 kg/m    Physical Exam Vitals and nursing note reviewed.  Constitutional:      General: She is not in acute distress.    Appearance: She is well-developed. She is not diaphoretic.  HENT:     Head: Normocephalic and atraumatic.     Mouth/Throat:     Pharynx: No oropharyngeal exudate.  Eyes:     Pupils: Pupils are equal, round, and reactive to light.  Neck:     Thyroid: No thyromegaly.     Vascular: No JVD.     Trachea: No tracheal deviation.  Cardiovascular:     Rate and Rhythm: Normal rate and regular rhythm.     Heart sounds: Normal heart sounds. No murmur. No friction rub. No gallop.   Pulmonary:     Effort: Pulmonary effort is normal. No respiratory distress.     Breath sounds: Normal breath sounds. No wheezing or rales.  Chest:     Chest wall: No tenderness.  Abdominal:     Palpations: Abdomen is  soft.     Tenderness: There is no abdominal tenderness. There is no guarding.  Musculoskeletal:        General: Normal range of motion.     Cervical back: Normal range of motion and neck supple.  Lymphadenopathy:     Cervical: No cervical adenopathy.  Skin:    General: Skin is warm and dry.  Neurological:     Mental Status: She is alert and oriented to person, place, and time.     Cranial Nerves: No cranial nerve deficit.  Psychiatric:  Behavior: Behavior normal.        Thought Content: Thought content normal.        Judgment: Judgment normal.    Assessment/Plan: 1. Essential hypertension Stable, continue present management.   2. Constipation, unspecified constipation type Patient continues to use linzess as directed.   3. Renal calculi Continue to monitor, drink plenty of fluids.   4. Poor compliance with medication Discussed importance of taking blood pressure medications as prescribed.   General Counseling: Tangelia verbalizes understanding of the findings of todays visit and agrees with plan of treatment. I have discussed any further diagnostic evaluation that may be needed or ordered today. We also reviewed her medications today. she has been encouraged to call the office with any questions or concerns that should arise related to todays visit.    No orders of the defined types were placed in this encounter.   No orders of the defined types were placed in this encounter.   Time spent: 30 Minutes   This patient was seen by Orson Gear AGNP-C in Collaboration with Dr Lavera Guise as a part of collaborative care agreement     Kendell Bane AGNP-C Internal medicine

## 2019-09-08 ENCOUNTER — Telehealth: Payer: Self-pay

## 2019-09-08 NOTE — Telephone Encounter (Signed)
Completed medical record request anf mailed records to Department of [facility]. Copy of request placed in hold.

## 2019-10-06 ENCOUNTER — Telehealth: Payer: Self-pay

## 2019-10-06 NOTE — Telephone Encounter (Signed)
Medical records request completed and requested records mailed to [facility] [address] [place] [address]. Copy placed in hold.

## 2019-10-22 ENCOUNTER — Ambulatory Visit: Payer: Medicaid Other | Admitting: Adult Health

## 2020-01-17 ENCOUNTER — Emergency Department
Admission: EM | Admit: 2020-01-17 | Discharge: 2020-01-17 | Disposition: A | Discharge status: Home / Self Care | Payer: Medicaid Other | Attending: Emergency Medicine | Admitting: Emergency Medicine

## 2020-01-17 ENCOUNTER — Other Ambulatory Visit: Payer: Self-pay

## 2020-01-17 ENCOUNTER — Encounter: Payer: Self-pay | Admitting: Emergency Medicine

## 2020-01-17 DIAGNOSIS — I10 Essential (primary) hypertension: Secondary | ICD-10-CM | POA: Diagnosis not present

## 2020-01-17 DIAGNOSIS — B9689 Other specified bacterial agents as the cause of diseases classified elsewhere: Secondary | ICD-10-CM | POA: Diagnosis not present

## 2020-01-17 DIAGNOSIS — Z79899 Other long term (current) drug therapy: Secondary | ICD-10-CM | POA: Diagnosis not present

## 2020-01-17 DIAGNOSIS — R350 Frequency of micturition: Secondary | ICD-10-CM | POA: Diagnosis not present

## 2020-01-17 DIAGNOSIS — J069 Acute upper respiratory infection, unspecified: Secondary | ICD-10-CM | POA: Insufficient documentation

## 2020-01-17 DIAGNOSIS — R0981 Nasal congestion: Secondary | ICD-10-CM | POA: Diagnosis present

## 2020-01-17 LAB — URINALYSIS, COMPLETE (UACMP) WITH MICROSCOPIC
Bilirubin Urine: NEGATIVE
Glucose, UA: NEGATIVE mg/dL
Ketones, ur: NEGATIVE mg/dL
Leukocytes,Ua: NEGATIVE
Nitrite: NEGATIVE
Protein, ur: NEGATIVE mg/dL
Specific Gravity, Urine: 1.021 (ref 1.005–1.030)
pH: 5 (ref 5.0–8.0)

## 2020-01-17 MED ORDER — PSEUDOEPH-BROMPHEN-DM 30-2-10 MG/5ML PO SYRP: 5.0000 mL | ORAL_SOLUTION | Freq: Four times a day (QID) | ORAL | 0 refills | Status: DC | PRN

## 2020-01-17 NOTE — Discharge Instructions (Addendum)
Follow-up with your primary care provider if any continued problems or concerns.  Increase fluids.  You may take Tylenol or ibuprofen if needed.  Use saline nose spray to help with the nasal congestion.  You may use this is many times as you need to.  A prescription for Bromfed-DM was sent to your pharmacy.  This is to replace the over-the-counter medication that you got.  It is not unusual for upper respiratory infection to last anywhere from 5 to 7 days.

## 2020-01-17 NOTE — ED Provider Notes (Signed)
[facility] Emergency Department Provider Note  ____________________________________________   First MD Initiated Contact with Patient 01/17/20 1404     (approximate)  I have reviewed the triage vital signs and the nursing notes.   HISTORY  Chief Complaint Nasal Congestion, Facial Pain, and Dysuria   HPI Ashley Mcpherson is a 44 y.o. female presents to the ED with complaint of congestion, sinus pressure and cough for 1 week.  Patient denies any fever.  There is been no change in taste, smell, nausea, vomiting or diarrhea.  Patient denies any known exposure to Covid.  Patient states she has been taking over-the-counter medication without any relief.  She also had some urinary frequency but no fever or chills related to that as well.     Past Medical History:  Diagnosis Date  . Hypertension   . IBS (irritable bowel syndrome)   . Kidney stone     Patient Active Problem List   Diagnosis Date Noted  . Encounter for school history and physical examination 03/02/2019  . 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 Local superficial swelling, mass or lump 08/13/2018  . Constipation 08/13/2018  . Umbilical hernia without obstruction and without gangrene 08/07/2018  . Flank pain 06/02/2018  . Renal calculi 06/02/2018  . Encounter for general adult medical examination with abnormal findings 11/28/2017  . Post traumatic stress disorder 11/28/2017  . Urinary tract infection with hematuria 08/12/2017  . Dysuria 08/12/2017  . Chronic bilateral low back pain 08/12/2017  . Essential hypertension 04/17/2017    Past Surgical History:  Procedure Laterality Date  . CRYOABLATION  2010  . TUBAL LIGATION  2007  . VAGINAL HYSTERECTOMY  2013   Dr. Logan Bores [facility])    Prior to Admission medications   Medication Sig Start Date End Date Taking? Authorizing Provider  brompheniramine-pseudoephedrine-DM 30-2-10 MG/5ML syrup Take 5 mLs by mouth 4 (four) times daily as  needed. 01/17/20   Tommi Rumps, PA-C  hydrochlorothiazide (HYDRODIURIL) 12.5 MG tablet Take 1 tablet (12.5 mg total) by mouth daily. 01/09/19   Johnna Acosta, NP  linaclotide (LINZESS) 290 MCG CAPS capsule Take 290 mcg by mouth daily before breakfast.    [provider]  losartan (COZAAR) 25 MG tablet Take 2 tablets (50 mg total) by mouth daily. 02/21/19   Johnna Acosta, NP  omeprazole (PRILOSEC) 40 MG capsule Take 40 mg by mouth daily.    [provider]  polyethylene glycol powder (GLYCOLAX/MIRALAX) 17 GM/SCOOP powder Take 17 g by mouth daily as needed for moderate constipation. 07/29/18   Minna Antis, MD    Allergies Sulfa antibiotics  Family History  Problem Relation Age of Onset  . Diabetes Mother   . Hypertension Mother   . Diabetes Father   . Hypertension Father   . Bladder Cancer Neg Hx   . Kidney cancer Neg Hx     Social History Social History   Tobacco Use  . Smoking status: Never Smoker  . Smokeless tobacco: Never Used  Vaping Use  . Vaping Use: Never used  Substance Use Topics  . Alcohol use: No  . Drug use: No    Review of Systems Constitutional: No fever/chills Eyes: No visual changes. ENT: Positive for nasal congestion. Cardiovascular: Denies chest pain. Respiratory: Denies shortness of breath. Gastrointestinal: No abdominal pain.  No nausea, no vomiting.  No diarrhea.   Genitourinary: Positive for mild dysuria. Musculoskeletal: Negative for back pain. Skin: Negative for rash. Neurological: Negative for headaches,  focal weakness or numbness. ____________________________________________   PHYSICAL EXAM:  VITAL SIGNS: ED Triage Vitals  Enc Vitals Group     BP 01/17/20 1318 (!) 164/82     Pulse Rate 01/17/20 1318 80     Resp 01/17/20 1318 16     Temp 01/17/20 1318 98 F (36.7 C)     Temp Source 01/17/20 1318 Oral     SpO2 01/17/20 1318 100 %     Weight 01/17/20 1302 169 lb (76.7 kg)     Height 01/17/20 1302 5'  4" (1.626 m)     Head Circumference --      Peak Flow --      Pain Score 01/17/20 1302 4     Pain Loc --      Pain Edu? --      Excl. in GC? --     Constitutional: Alert and oriented. Well appearing and in no acute distress. Eyes: Conjunctivae are normal.  Head: Atraumatic. Nose: Moderate congestion/negative rhinnorhea. Mouth/Throat: Mucous membranes are moist.  Oropharynx non-erythematous. Neck: No stridor.   Cardiovascular: Normal rate, regular rhythm. Grossly normal heart sounds.  Good peripheral circulation. Respiratory: Normal respiratory effort.  No retractions. Lungs CTAB. Musculoskeletal: No lower extremity tenderness nor edema.  No joint effusions. Neurologic:  Normal speech and language. No gross focal neurologic deficits are appreciated. No gait instability. Skin:  Skin is warm, dry and intact. No rash noted. Psychiatric: Mood and affect are normal. Speech and behavior are normal.  ____________________________________________   LABS (all labs ordered are listed, but only abnormal results are displayed)  Labs Reviewed  URINALYSIS, COMPLETE (UACMP) WITH MICROSCOPIC - Abnormal; Notable for the following components:      Result Value   Color, Urine YELLOW (*)    APPearance HAZY (*)    Hgb urine dipstick SMALL (*)    Bacteria, UA RARE (*)    All other components within normal limits     PROCEDURES  Procedure(s) performed (including Critical Care):  Procedures   ____________________________________________   INITIAL IMPRESSION / ASSESSMENT AND PLAN / ED COURSE  As part of my medical decision making, I reviewed the following data within the electronic MEDICAL RECORD NUMBER Notes from prior ED visits and [place] Controlled Substance Database  44 year old female presents to the ED with complaint of nasal congestion and sinus pressure for the last week without history of fever, chills, nausea or vomiting.  She denies any cough or Covid symptoms.  She is also had some  dysuria and frequency but no continued symptoms.  Urinalysis was negative.  Physical exam was benign with exception of some nasal congestion.  Patient was to discontinue taking the over-the-counter medication that she is taking currently.  A prescription for Bromfed-DM was sent to her pharmacy.  She also is aware that she can use saline nasal spray to help with the nasal congestion.  She will follow-up with her PCP if any continued problems.  ____________________________________________   FINAL CLINICAL IMPRESSION(S) / ED DIAGNOSES  Final diagnoses:  Upper respiratory tract infection, unspecified type     ED Discharge Orders         Ordered    brompheniramine-pseudoephedrine-DM 30-2-10 MG/5ML syrup  4 times daily PRN        01/17/20 1441          *Please note:  Kathrin L Limones was evaluated in Emergency Department on 01/17/2020 for the symptoms described in the history of present illness. She was evaluated in the context of  the global COVID-19 pandemic, which necessitated consideration that the patient might be at risk for infection with the SARS-CoV-2 virus that causes COVID-19. Institutional protocols and algorithms that pertain to the evaluation of patients at risk for COVID-19 are in a state of rapid change based on information released by regulatory bodies including the CDC and federal and state organizations. These policies and algorithms were followed during the patient's care in the ED.  Some ED evaluations and interventions may be delayed as a result of limited staffing during and the pandemic.*   Note:  This document was prepared using Dragon voice recognition software and may include unintentional dictation errors.    Tommi Rumps, PA-C 01/17/20 1508    Gilles Chiquito, MD 01/17/20 [phone]

## 2020-01-17 NOTE — ED Triage Notes (Signed)
Pt reports nasal congestion and sinus pressure and pain for the past week. Pt denies fevers. Pt also reports some dysuria and frequency for the last few weeks.

## 2020-01-20 ENCOUNTER — Ambulatory Visit: Payer: Medicaid Other | Admitting: Internal Medicine

## 2020-01-20 ENCOUNTER — Other Ambulatory Visit: Payer: Self-pay

## 2020-01-20 ENCOUNTER — Encounter: Payer: Self-pay | Admitting: Internal Medicine

## 2020-01-20 DIAGNOSIS — D649 Anemia, unspecified: Secondary | ICD-10-CM | POA: Diagnosis not present

## 2020-01-20 DIAGNOSIS — J0100 Acute maxillary sinusitis, unspecified: Secondary | ICD-10-CM | POA: Diagnosis not present

## 2020-01-20 DIAGNOSIS — J300 Vasomotor rhinitis: Secondary | ICD-10-CM

## 2020-01-20 MED ORDER — AMOXICILLIN-POT CLAVULANATE 875-125 MG PO TABS: ORAL_TABLET | ORAL | 0 refills | Status: DC

## 2020-01-20 NOTE — Progress Notes (Signed)
[facility] [address] [place], [place] [address]  Internal MEDICINE  Telephone Visit  Patient Name: Ashley Mcpherson  [id number]  [id number]  Date of Service: 01/20/2020  I connected with the patient at  851am  by telephone and verified the patients identity using two identifiers.   I discussed the limitations, risks, security and privacy concerns of performing an evaluation and management service by telephone and the availability of in person appointments. I also discussed with the patient that there may be a patient responsible charge related to the service.  The patient expressed understanding and agrees to proceed.    Chief Complaint  Patient presents with  . Acute Visit    started last friday, no smell, coughing, sneezing, teeth hurt, nose is stopped up, headache, not tested for covid but vaccinated   . Sinusitis  . Telephone Assessment    [phone]  . Telephone Screen    phone call  . Hypertension    HPI  Pt is connected via virtual visit. Pt is having sinus congestion  And cough, she went to ED and was eva;aued for covid, cough syrup was given. She has not seen any improvement in her symptoms for last 3 days, now she feels pressure in her cheeks and on her teeth  No fever or chills   Current Medication: Outpatient Encounter Medications as of 01/20/2020  Medication Sig  . linaclotide (LINZESS) 290 MCG CAPS capsule Take 290 mcg by mouth daily before breakfast.  . omeprazole (PRILOSEC) 40 MG capsule Take 40 mg by mouth daily.  . polyethylene glycol powder (GLYCOLAX/MIRALAX) 17 GM/SCOOP powder Take 17 g by mouth daily as needed for moderate constipation.  Marland Kitchen amoxicillin-clavulanate (AUGMENTIN) 875-125 MG tablet Take one tab po bid with food  . [DISCONTINUED] brompheniramine-pseudoephedrine-DM 30-2-10 MG/5ML syrup Take 5 mLs by mouth 4 (four) times daily as needed. (Patient not taking: Reported on 01/20/2020)  . [DISCONTINUED] hydrochlorothiazide (HYDRODIURIL)  12.5 MG tablet Take 1 tablet (12.5 mg total) by mouth daily. (Patient not taking: Reported on 01/20/2020)  . [DISCONTINUED] losartan (COZAAR) 25 MG tablet Take 2 tablets (50 mg total) by mouth daily. (Patient not taking: Reported on 01/20/2020)   No facility-administered encounter medications on file as of 01/20/2020.    Surgical History: Past Surgical History:  Procedure Laterality Date  . CRYOABLATION  2010  . TUBAL LIGATION  2007  . VAGINAL HYSTERECTOMY  2013   Dr. Logan Bores [facility])    Medical History: Past Medical History:  Diagnosis Date  . Hypertension   . IBS (irritable bowel syndrome)   . Kidney stone     Family History: Family History  Problem Relation Age of Onset  . Diabetes Mother   . Hypertension Mother   . Diabetes Father   . Hypertension Father   . Bladder Cancer Neg Hx   . Kidney cancer Neg Hx     Social History   Socioeconomic History  . Marital status: Single    Spouse name: Not on file  . Number of children: Not on file  . Years of education: Not on file  . Highest education level: Not on file  Occupational History  . Not on file  Tobacco Use  . Smoking status: Never Smoker  . Smokeless tobacco: Never Used  Vaping Use  . Vaping Use: Never used  Substance and Sexual Activity  . Alcohol use: No  . Drug use: No  . Sexual activity: Yes    Birth control/protection: Surgical  Other Topics Concern  .  Not on file  Social History Narrative  . Not on file   Social Determinants of Health   Financial Resource Strain:   . Difficulty of Paying Living Expenses: Not on file  Food Insecurity:   . Worried About Programme researcher, broadcasting/film/video in the Last Year: Not on file  . Ran Out of Food in the Last Year: Not on file  Transportation Needs:   . Lack of Transportation (Medical): Not on file  . Lack of Transportation (Non-Medical): Not on file  Physical Activity:   . Days of Exercise per Week: Not on file  . Minutes of Exercise per Session: Not on file  Stress:    . Feeling of Stress : Not on file  Social Connections:   . Frequency of Communication with Friends and Family: Not on file  . Frequency of Social Gatherings with Friends and Family: Not on file  . Attends Religious Services: Not on file  . Active Member of Clubs or Organizations: Not on file  . Attends Banker Meetings: Not on file  . Marital Status: Not on file  Intimate Partner Violence:   . Fear of Current or Ex-Partner: Not on file  . Emotionally Abused: Not on file  . Physically Abused: Not on file  . Sexually Abused: Not on file     Review of Systems  Constitutional: Negative for chills, diaphoresis and fatigue.  HENT: Positive for facial swelling and postnasal drip. Negative for ear pain and sinus pressure.   Eyes: Negative for photophobia, discharge, redness, itching and visual disturbance.  Respiratory: Negative for cough, shortness of breath and wheezing.   Cardiovascular: Negative for chest pain, palpitations and leg swelling.  Gastrointestinal: Negative for abdominal pain, constipation, diarrhea, nausea and vomiting.  Genitourinary: Negative for dysuria and flank pain.  Musculoskeletal: Negative for arthralgias, back pain, gait problem and neck pain.  Skin: Negative for color change.  Allergic/Immunologic: Negative for environmental allergies and food allergies.  Neurological: Negative for dizziness and headaches.  Hematological: Does not bruise/bleed easily.  Psychiatric/Behavioral: Negative for agitation, behavioral problems (depression) and hallucinations.    Vital Signs: Resp 16   Ht 5\' 4"  (1.626 m)   Wt 170 lb (77.1 kg)   BMI 29.18 kg/m    Observation/Objective:  Nasal congestion noticed during conversation, no acute distress otherwise    Assessment/Plan: 1. Acute non-recurrent maxillary sinusitis Start Augmentin 875 mg po bid x 10 days , stay hydrated    2. Vasomotor rhinitis Add Flonase bid per instructions  3. Anemia, unspecified  type Noted to be anemic, will get iron studies. She has h/o constipation, will need colonoscopy  - CBC with Differential/Platelet - Fe+TIBC+Fer - B12 and Folate Panel  General Counseling: [name] verbalizes understanding of the findings of today's phone visit and agrees with plan of treatment. I have discussed any further diagnostic evaluation that may be needed or ordered today. We also reviewed her medications today. she has been encouraged to call the office with any questions or concerns that should arise related to todays visit.    Orders Placed This Encounter  Procedures  . CBC with Differential/Platelet  . Fe+TIBC+Fer  . B12 and Folate Panel    Meds ordered this encounter  Medications  . amoxicillin-clavulanate (AUGMENTIN) 875-125 MG tablet    Sig: Take one tab po bid with food    Dispense:  20 tablet    Refill:  0    Time spent:20 Minutes    Dr Internal  medicine

## 2020-01-26 ENCOUNTER — Telehealth: Payer: Self-pay

## 2020-01-26 NOTE — Telephone Encounter (Signed)
PT notified

## 2020-01-26 NOTE — Telephone Encounter (Signed)
-----   Message from Lyndon Code, MD sent at 01/23/2020  6:25 PM EST ----- Please let the pt know she needs to go for labs

## 2020-01-31 LAB — CBC WITH DIFFERENTIAL/PLATELET
Basophils Absolute: 0 10*3/uL (ref 0.0–0.2)
Basos: 0 %
EOS (ABSOLUTE): 0.1 10*3/uL (ref 0.0–0.4)
Eos: 2 %
Hematocrit: 34.4 % (ref 34.0–46.6)
Hemoglobin: 11 g/dL — ABNORMAL LOW (ref 11.1–15.9)
Immature Grans (Abs): 0 10*3/uL (ref 0.0–0.1)
Immature Granulocytes: 1 %
Lymphocytes Absolute: 2.1 10*3/uL (ref 0.7–3.1)
Lymphs: 35 %
MCH: 24 pg — ABNORMAL LOW (ref 26.6–33.0)
MCHC: 32 g/dL (ref 31.5–35.7)
MCV: 75 fL — ABNORMAL LOW (ref 79–97)
Monocytes Absolute: 0.4 10*3/uL (ref 0.1–0.9)
Monocytes: 6 %
Neutrophils Absolute: 3.4 10*3/uL (ref 1.4–7.0)
Neutrophils: 56 %
Platelets: 389 10*3/uL (ref 150–450)
RBC: 4.58 x10E6/uL (ref 3.77–5.28)
RDW: 15.1 % (ref 11.7–15.4)
WBC: 6 10*3/uL (ref 3.4–10.8)

## 2020-01-31 LAB — B12 AND FOLATE PANEL
Folate: 14.9 ng/mL (ref >3.0)
Vitamin B-12: 504 pg/mL (ref 232–1245)

## 2020-01-31 LAB — IRON,TIBC AND FERRITIN PANEL
Ferritin: 146 ng/mL (ref 15–150)
Iron Saturation: 21 % (ref 15–55)
Iron: 61 ug/dL (ref 27–159)
Total Iron Binding Capacity: 284 ug/dL (ref 250–450)
UIBC: 223 ug/dL (ref 131–425)

## 2020-02-02 ENCOUNTER — Ambulatory Visit: Payer: Medicaid Other | Admitting: Hospice and Palliative Medicine

## 2020-02-02 NOTE — Progress Notes (Signed)
Might need Cologuard

## 2020-02-09 ENCOUNTER — Encounter: Payer: Self-pay | Admitting: Hospice and Palliative Medicine

## 2020-02-09 ENCOUNTER — Other Ambulatory Visit: Payer: Self-pay

## 2020-02-09 ENCOUNTER — Other Ambulatory Visit: Payer: Self-pay | Admitting: Hospice and Palliative Medicine

## 2020-02-09 ENCOUNTER — Ambulatory Visit: Payer: Medicaid Other | Admitting: Hospice and Palliative Medicine

## 2020-02-09 DIAGNOSIS — D649 Anemia, unspecified: Secondary | ICD-10-CM

## 2020-02-09 DIAGNOSIS — N949 Unspecified condition associated with female genital organs and menstrual cycle: Secondary | ICD-10-CM | POA: Diagnosis not present

## 2020-02-09 DIAGNOSIS — D563 Thalassemia minor: Secondary | ICD-10-CM

## 2020-02-09 DIAGNOSIS — R030 Elevated blood-pressure reading, without diagnosis of hypertension: Secondary | ICD-10-CM | POA: Diagnosis not present

## 2020-02-09 LAB — HIV ANTIBODY (ROUTINE TESTING W REFLEX): HIV 1&2 Ab, 4th Generation: NONREACTIVE

## 2020-02-09 LAB — OB RESULTS CONSOLE GC/CHLAMYDIA: Chlamydia: NEGATIVE

## 2020-02-09 LAB — HM HIV SCREENING LAB: HM HIV Screening: NEGATIVE

## 2020-02-09 NOTE — Progress Notes (Signed)
[facility] [address] [place], [place] [address]  Internal MEDICINE  Office Visit Note  Patient Name: Ashley Mcpherson  [id number]  [id number]  Date of Service: 02/12/2020  Chief Complaint  Patient presents with  . Follow-up  . Hypertension    HPI Patient is here for routine follow-up Last seen via virtual visit for prolonged symptoms related to COVID19 infection--all have resolved at this time Labs reviewed at previous visit--anemia found and further iron studies were ordered Reviewed iron studies and other labs--normal Remains anemic--she mentions that she is always anemic because she carries beta thalassemia trait, has been evaluated by hematology in the past, everything was found to be stable but was told her hemoglobin will always run low, she has never been symptomatic due to anemia  Main concern today is a lesion that has reappeared on her buttock First appeared about 2 years ago after having protected sexual intercourse with female partner, lesion first appeared as a cluster then progressed to scaly lesion, no pain associated, no further lesions noted, lasted several months and then disappeared She has noticed the same lesion has reappeared in the same location on her right buttock--she has searched the Internet and is convinced she has contracted herpes She has not had unprotected sexual intercourse in the last year Has not noticed any further lesions in her genital area Denies vaginal discharge or odors Lab slip given for STD screen, chancre sore on right buttock   Current Medication: Outpatient Encounter Medications as of 02/09/2020  Medication Sig  . linaclotide (LINZESS) 290 MCG CAPS capsule Take 290 mcg by mouth daily before breakfast.  . omeprazole (PRILOSEC) 40 MG capsule Take 40 mg by mouth daily.  . polyethylene glycol powder (GLYCOLAX/MIRALAX) 17 GM/SCOOP powder Take 17 g by mouth daily as needed for moderate constipation.  . [DISCONTINUED]  amoxicillin-clavulanate (AUGMENTIN) 875-125 MG tablet Take one tab po bid with food (Patient not taking: Reported on 02/09/2020)   No facility-administered encounter medications on file as of 02/09/2020.    Surgical History: Past Surgical History:  Procedure Laterality Date  . CRYOABLATION  2010  . TUBAL LIGATION  2007  . VAGINAL HYSTERECTOMY  2013   Dr. Logan Bores [facility])    Medical History: Past Medical History:  Diagnosis Date  . Hypertension   . IBS (irritable bowel syndrome)   . Kidney stone     Family History: Family History  Problem Relation Age of Onset  . Diabetes Mother   . Hypertension Mother   . Diabetes Father   . Hypertension Father   . Bladder Cancer Neg Hx   . Kidney cancer Neg Hx     Social History   Socioeconomic History  . Marital status: Single    Spouse name: Not on file  . Number of children: Not on file  . Years of education: Not on file  . Highest education level: Not on file  Occupational History  . Not on file  Tobacco Use  . Smoking status: Never Smoker  . Smokeless tobacco: Never Used  Vaping Use  . Vaping Use: Never used  Substance and Sexual Activity  . Alcohol use: No  . Drug use: No  . Sexual activity: Yes    Birth control/protection: Surgical  Other Topics Concern  . Not on file  Social History Narrative  . Not on file   Social Determinants of Health   Financial Resource Strain:   . Difficulty of Paying Living Expenses: Not on file  Food Insecurity:   .  Worried About Programme researcher, broadcasting/film/video in the Last Year: Not on file  . Ran Out of Food in the Last Year: Not on file  Transportation Needs:   . Lack of Transportation (Medical): Not on file  . Lack of Transportation (Non-Medical): Not on file  Physical Activity:   . Days of Exercise per Week: Not on file  . Minutes of Exercise per Session: Not on file  Stress:   . Feeling of Stress : Not on file  Social Connections:   . Frequency of Communication with Friends and Family:  Not on file  . Frequency of Social Gatherings with Friends and Family: Not on file  . Attends Religious Services: Not on file  . Active Member of Clubs or Organizations: Not on file  . Attends Banker Meetings: Not on file  . Marital Status: Not on file  Intimate Partner Violence:   . Fear of Current or Ex-Partner: Not on file  . Emotionally Abused: Not on file  . Physically Abused: Not on file  . Sexually Abused: Not on file   Review of Systems  Constitutional: Negative for chills, diaphoresis and fatigue.  HENT: Negative for ear pain, postnasal drip and sinus pressure.   Eyes: Negative for photophobia, discharge, redness, itching and visual disturbance.  Respiratory: Negative for cough, shortness of breath and wheezing.   Cardiovascular: Negative for chest pain, palpitations and leg swelling.  Gastrointestinal: Negative for abdominal pain, constipation, diarrhea, nausea and vomiting.  Genitourinary: Negative for dysuria and flank pain.       Lesion to buttock  Musculoskeletal: Negative for arthralgias, back pain, gait problem and neck pain.  Skin: Negative for color change.  Allergic/Immunologic: Negative for environmental allergies and food allergies.  Neurological: Negative for dizziness and headaches.  Hematological: Does not bruise/bleed easily.  Psychiatric/Behavioral: Negative for agitation, behavioral problems (depression) and hallucinations.    Vital Signs: BP (!) 162/78   Pulse 65   Temp (!) 97.5 F (36.4 C)   Resp 16   Ht 5\' 4"  (1.626 m)   Wt 174 lb 6.4 oz (79.1 kg)   SpO2 99%   BMI 29.94 kg/m    Physical Exam Vitals reviewed.  Constitutional:      Appearance: Normal appearance. She is obese.  Cardiovascular:     Rate and Rhythm: Normal rate and regular rhythm.     Pulses: Normal pulses.     Heart sounds: Normal heart sounds.  Pulmonary:     Effort: Pulmonary effort is normal.     Breath sounds: Normal breath sounds.  Abdominal:      General: Abdomen is flat.  Genitourinary:    Comments: Clustered lesion, right side of rectum, scaly in appearance, no drainage or discharge Musculoskeletal:        General: Normal range of motion.     Cervical back: Normal range of motion.  Skin:    General: Skin is warm.  Neurological:     General: No focal deficit present.     Mental Status: She is alert and oriented to person, place, and time. Mental status is at baseline.  Psychiatric:        Mood and Affect: Mood normal.        Behavior: Behavior normal.        Thought Content: Thought content normal.        Judgment: Judgment normal.      Assessment/Plan: 1. Beta thalassemia trait Was told in the past by hematologist she carries this  trait--has not been evaluated by hematology recently  2. Anemia, unspecified type Iron level and folate normal, remains slightly anemic, will continue to monitor  3. Genital lesion, female Full STI panel for review  4. Elevated BP without diagnosis of hypertension She has been stressed about this lesion for several days and has not been sleeping due to the stress--will monitor BP at next visit for return to baseline  General Counseling: Ahaana verbalizes understanding of the findings of todays visit and agrees with plan of treatment. I have discussed any further diagnostic evaluation that may be needed or ordered today. We also reviewed her medications today. she has been encouraged to call the office with any questions or concerns that should arise related to todays visit.   Time spent: 30 Minutes Time spent includes review of chart, medications, test results and follow-up plan with the patient.  This patient was seen by Leeanne Deed AGNP-C in Collaboration with Dr Lyndon Code as a part of collaborative care agreement     Lubertha Basque. Madline Oesterling AGNP-C Internal medicine

## 2020-02-10 ENCOUNTER — Other Ambulatory Visit: Payer: Self-pay | Admitting: Hospice and Palliative Medicine

## 2020-02-10 MED ORDER — FAMCICLOVIR 500 MG PO TABS: 500.0000 mg | ORAL_TABLET | Freq: Two times a day (BID) | ORAL | 2 refills | Status: DC

## 2020-02-10 NOTE — Progress Notes (Signed)
Called and discussed lab results with her, will initiate treatment, sent to her pharmacy.

## 2020-02-11 LAB — HSV 2 ANTIBODY, IGG: HSV 2 IgG, Type Spec: 13.2 index — ABNORMAL HIGH (ref 0.00–0.90)

## 2020-02-11 LAB — CHLAMYDIA/GONOCOCCUS/TRICHOMONAS, NAA
Chlamydia by NAA: NEGATIVE
Gonococcus by NAA: NEGATIVE
Trich vag by NAA: NEGATIVE

## 2020-02-11 LAB — RPR QUALITATIVE: RPR Ser Ql: NONREACTIVE

## 2020-02-11 LAB — HIV ANTIBODY (ROUTINE TESTING W REFLEX): HIV Screen 4th Generation wRfx: NONREACTIVE

## 2020-02-12 ENCOUNTER — Encounter: Payer: Self-pay | Admitting: Hospice and Palliative Medicine

## 2020-02-23 ENCOUNTER — Ambulatory Visit: Payer: Medicaid Other | Admitting: Hospice and Palliative Medicine

## 2020-02-23 ENCOUNTER — Other Ambulatory Visit: Payer: Self-pay

## 2020-02-23 ENCOUNTER — Encounter: Payer: Self-pay | Admitting: Hospice and Palliative Medicine

## 2020-02-23 VITALS — BP 165/101 | HR 76 | Temp 98.4°F | Resp 16 | Ht 64.0 in | Wt 179.2 lb

## 2020-02-23 DIAGNOSIS — D563 Thalassemia minor: Secondary | ICD-10-CM | POA: Diagnosis not present

## 2020-02-23 DIAGNOSIS — F411 Generalized anxiety disorder: Secondary | ICD-10-CM | POA: Diagnosis not present

## 2020-02-23 DIAGNOSIS — K5900 Constipation, unspecified: Secondary | ICD-10-CM | POA: Diagnosis not present

## 2020-02-23 DIAGNOSIS — I10 Essential (primary) hypertension: Secondary | ICD-10-CM

## 2020-02-23 DIAGNOSIS — G4700 Insomnia, unspecified: Secondary | ICD-10-CM

## 2020-02-23 MED ORDER — ESCITALOPRAM OXALATE 5 MG PO TABS: 5.0000 mg | ORAL_TABLET | Freq: Every day | ORAL | 0 refills | Status: DC

## 2020-02-23 MED ORDER — ZOLPIDEM TARTRATE 5 MG PO TABS: 5.0000 mg | ORAL_TABLET | Freq: Every evening | ORAL | 1 refills | Status: DC | PRN

## 2020-02-23 NOTE — Progress Notes (Signed)
[facility] [address] [place], [place] [address]  Internal MEDICINE  Office Visit Note  Patient Name: Ashley Mcpherson  [id number]  [id number]  Date of Service: 02/27/2020  Chief Complaint  Patient presents with  . Follow-up  . 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 Still overwhelmed by recent diagnosis, has not had any further breakouts Denies any side effects since starting medication  At last visit her BP was elevated, contributed this to stress over diagnosis Discussed her BP remains elevated today-she reports she is not sleeping well at night, over this year she has been dealing with a lot of stress related to personal and family issues She feels anxious and overwhelmed everyday and this carries into the evenings preventing her from being able to sleep Reviewed recent labs--no abnormal levels potentially causing symptoms of anxiety or nervousness She is averaging about 3-4 hours of sleep per night--this has been going on for over 6 months, she is physically and mentally exhausted  Has been on losartan in the past--stopped taking them a few months ago as her BP was well controlled  History is IBS-D well controlled with Linzess-takes 1-2 times per week History of Beta Thalassemia trait, anemia remains stable, has not been seen by a hematologist in many years   Current Medication: Outpatient Encounter Medications as of 02/23/2020  Medication Sig  . famciclovir (FAMVIR) 500 MG tablet Take 1 tablet (500 mg total) by mouth 2 (two) times daily.  Marland Kitchen linaclotide (LINZESS) 290 MCG CAPS capsule Take 290 mcg by mouth daily before breakfast.  . omeprazole (PRILOSEC) 40 MG capsule Take 40 mg by mouth daily.  . polyethylene glycol powder (GLYCOLAX/MIRALAX) 17 GM/SCOOP powder Take 17 g by mouth daily as needed for moderate constipation.  Marland Kitchen escitalopram (LEXAPRO) 5 MG tablet Take 1 tablet  (5 mg total) by mouth daily.  Marland Kitchen zolpidem (AMBIEN) 5 MG tablet Take 1 tablet (5 mg total) by mouth at bedtime as needed for sleep.   No facility-administered encounter medications on file as of 02/23/2020.    Surgical History: Past Surgical History:  Procedure Laterality Date  . CRYOABLATION  2010  . TUBAL LIGATION  2007  . VAGINAL HYSTERECTOMY  2013   Dr. Logan Bores [facility])    Medical History: Past Medical History:  Diagnosis Date  . Hypertension   . IBS (irritable bowel syndrome)   . Kidney stone     Family History: Family History  Problem Relation Age of Onset  . Diabetes Mother   . Hypertension Mother   . Diabetes Father   . Hypertension Father   . Bladder Cancer Neg Hx   . Kidney cancer Neg Hx     Social History   Socioeconomic History  . Marital status: Single    Spouse name: Not on file  . Number of children: Not on file  . Years of education: Not on file  . Highest education level: Not on file  Occupational History  . Not on file  Tobacco Use  . Smoking status: Never Smoker  . Smokeless tobacco: Never Used  Vaping Use  . Vaping Use: Never used  Substance and Sexual Activity  . Alcohol use: No  . Drug use: No  . Sexual activity: Yes    Birth control/protection: Surgical  Other Topics Concern  . Not on file  Social History Narrative  . Not on file   Social Determinants  of Health   Financial Resource Strain: Not on file  Food Insecurity: Not on file  Transportation Needs: Not on file  Physical Activity: Not on file  Stress: Not on file  Social Connections: Not on file  Intimate Partner Violence: Not on file      Review of Systems  Constitutional: Negative for chills, diaphoresis and fatigue.  HENT: Negative for ear pain, postnasal drip and sinus pressure.   Eyes: Negative for photophobia, discharge, redness, itching and visual disturbance.  Respiratory: Negative for cough, shortness of breath and wheezing.   Cardiovascular: Negative for chest  pain, palpitations and leg swelling.  Gastrointestinal: Negative for abdominal pain, constipation, diarrhea, nausea and vomiting.  Genitourinary: Negative for dysuria and flank pain.  Musculoskeletal: Negative for arthralgias, back pain, gait problem and neck pain.  Skin: Negative for color change.  Allergic/Immunologic: Negative for environmental allergies and food allergies.  Neurological: Negative for dizziness and headaches.  Hematological: Does not bruise/bleed easily.  Psychiatric/Behavioral: Positive for sleep disturbance. Negative for agitation, behavioral problems (depression) and hallucinations. The patient is nervous/anxious.     Vital Signs: BP (!) 165/101   Pulse 76   Temp 98.4 F (36.9 C) (Temporal)   Resp 16   Ht 5\' 4"  (1.626 m)   Wt 179 lb 3.2 oz (81.3 kg)   SpO2 100%   BMI 30.76 kg/m    Physical Exam Vitals reviewed.  Constitutional:      Appearance: Normal appearance. She is obese.  HENT:     Right Ear: Tympanic membrane normal.     Left Ear: Tympanic membrane normal.     Nose: Nose normal.     Mouth/Throat:     Mouth: Mucous membranes are moist.  Eyes:     Pupils: Pupils are equal, round, and reactive to light.  Cardiovascular:     Rate and Rhythm: Normal rate and regular rhythm.     Pulses: Normal pulses.     Heart sounds: Normal heart sounds.  Pulmonary:     Effort: Pulmonary effort is normal.     Breath sounds: Normal breath sounds.  Abdominal:     General: Abdomen is flat.  Musculoskeletal:        General: Normal range of motion.     Cervical back: Normal range of motion.  Skin:    General: Skin is warm.  Neurological:     General: No focal deficit present.     Mental Status: She is alert and oriented to person, place, and time. Mental status is at baseline.  Psychiatric:        Mood and Affect: Mood normal.        Behavior: Behavior normal.        Thought Content: Thought content normal.        Judgment: Judgment normal.     Assessment/Plan: 1. Essential hypertension Restart Losartan at 25 mg--she does not want a new prescription today as she has several bottles at home of this dose Advised to contact office for refills if medication has expired or dosage is different than 25 mg  2. Constipation, unspecified constipation type BM remain regular with use of Linzess, continue with therapy and monitoring May need to see GI if symptoms worsen  3. Beta thalassemia trait Anemia remains stable, no reports of bleeding, continue to closely monitor  4. GAD (generalized anxiety disorder) Will start Lexapro for uncontrolled anxiety--will assess response to therapy - escitalopram (LEXAPRO) 5 MG tablet; Take 1 tablet (5 mg total) by mouth  daily.  Dispense: 30 tablet; Refill: 0  5. Insomnia, unspecified type Start low dose Ambien while awaiting lexapro to take effect--started discussions about OSA and wanting to get sleep study, will wait until anxiety is better controlled - zolpidem (AMBIEN) 5 MG tablet; Take 1 tablet (5 mg total) by mouth at bedtime as needed for sleep.  Dispense: 15 tablet; Refill: 1  General Counseling: Arthella verbalizes understanding of the findings of todays visit and agrees with plan of treatment. I have discussed any further diagnostic evaluation that may be needed or ordered today. We also reviewed her medications today. she has been encouraged to call the office with any questions or concerns that should arise related to todays visit.  Meds ordered this encounter  Medications  . escitalopram (LEXAPRO) 5 MG tablet    Sig: Take 1 tablet (5 mg total) by mouth daily.    Dispense:  30 tablet    Refill:  0  . zolpidem (AMBIEN) 5 MG tablet    Sig: Take 1 tablet (5 mg total) by mouth at bedtime as needed for sleep.    Dispense:  15 tablet    Refill:  1    Time spent: 30 Minutes Time spent includes review of chart, medications, test results and follow-up plan with the patient.  This patient  was seen by Leeanne Deed AGNP-C in Collaboration with Dr Lyndon Code as a part of collaborative care agreement     Lubertha Basque. Broadus Costilla AGNP-C Internal medicine

## 2020-02-27 ENCOUNTER — Encounter: Payer: Self-pay | Admitting: Hospice and Palliative Medicine

## 2020-03-15 ENCOUNTER — Ambulatory Visit: Payer: Medicaid Other | Admitting: Nurse Practitioner

## 2020-03-15 ENCOUNTER — Encounter: Payer: Self-pay | Admitting: Nurse Practitioner

## 2020-03-15 VITALS — Resp 16 | Ht 64.0 in | Wt 175.0 lb

## 2020-03-15 DIAGNOSIS — R0602 Shortness of breath: Secondary | ICD-10-CM | POA: Diagnosis not present

## 2020-03-15 DIAGNOSIS — J069 Acute upper respiratory infection, unspecified: Secondary | ICD-10-CM | POA: Diagnosis not present

## 2020-03-15 MED ORDER — METHYLPREDNISOLONE 4 MG PO TBPK: ORAL_TABLET | ORAL | 0 refills | Status: DC

## 2020-03-15 MED ORDER — AZITHROMYCIN 250 MG PO TABS: ORAL_TABLET | ORAL | 0 refills | Status: DC

## 2020-03-15 MED ORDER — ALBUTEROL SULFATE HFA 108 (90 BASE) MCG/ACT IN AERS: 2.0000 | INHALATION_SPRAY | Freq: Four times a day (QID) | RESPIRATORY_TRACT | 2 refills | Status: DC | PRN

## 2020-03-15 NOTE — Progress Notes (Signed)
[facility] [address] [place], [place] [address]  Internal MEDICINE  Telephone Visit  Patient Name: Ashley Mcpherson  [id number]  [id number]  Date of Service: 04/02/2020  I connected with the patient at 12:34pm by telephone and verified the patients identity using two identifiers.   I discussed the limitations, risks, security and privacy concerns of performing an evaluation and management service by telephone and the availability of in person appointments. I also discussed with the patient that there may be a patient responsible charge related to the service.  The patient expressed understanding and agrees to proceed.    Chief Complaint  Patient presents with  . Acute Visit    Coughing, congested, tired, head feels clogged up, started Friday/Saturday, pt's job wont let her return to work until she has a note  . Telephone Assessment    Video call  . Telephone Screen    [phone]  . Quality Metric Gaps    Colonoscopy     The patient has been contacted via telephone for follow up visit due to concerns for spread of novel coronavirus.  She presents for acute visit. States that she has significant congestion, cough, she feels like she can't catch her breath. She has congestion, headache, sore throat, and cough. She states that symptoms started on New Year's Eve. Denies fever. Does not believe she has been exposed to anyone with COVID 19 or flu. Has been fully vaccinated against COVID 19 and has had flu vaccine. She is unable to go to work until she gets "cleared" by her PCP. She did take a home COVID 19 test on 03/15/2020 and results were negative.       Current Medication: Outpatient Encounter Medications as of 03/15/2020  Medication Sig  . albuterol (VENTOLIN HFA) 108 (90 Base) MCG/ACT inhaler Inhale 2 puffs into the lungs every 6 (six) hours as needed for wheezing or shortness of breath.  Marland Kitchen azithromycin (ZITHROMAX) 250 MG tablet z-pack - take as directed for 5 days  .  escitalopram (LEXAPRO) 5 MG tablet Take 1 tablet (5 mg total) by mouth daily.  . famciclovir (FAMVIR) 500 MG tablet Take 1 tablet (500 mg total) by mouth 2 (two) times daily.  Marland Kitchen linaclotide (LINZESS) 290 MCG CAPS capsule Take 290 mcg by mouth daily before breakfast.  . methylPREDNISolone (MEDROL) 4 MG TBPK tablet Take by mouth as directed for 6 days  . omeprazole (PRILOSEC) 40 MG capsule Take 40 mg by mouth daily.  . polyethylene glycol powder (GLYCOLAX/MIRALAX) 17 GM/SCOOP powder Take 17 g by mouth daily as needed for moderate constipation.  Marland Kitchen zolpidem (AMBIEN) 5 MG tablet Take 1 tablet (5 mg total) by mouth at bedtime as needed for sleep.   No facility-administered encounter medications on file as of 03/15/2020.    Surgical History: Past Surgical History:  Procedure Laterality Date  . CRYOABLATION  2010  . TUBAL LIGATION  2007  . VAGINAL HYSTERECTOMY  2013   Dr. Logan Bores [facility])    Medical History: Past Medical History:  Diagnosis Date  . Hypertension   . IBS (irritable bowel syndrome)   . Kidney stone     Family History: Family History  Problem Relation Age of Onset  . Diabetes Mother   . Hypertension Mother   . Diabetes Father   . Hypertension Father   . Bladder Cancer Neg Hx   . Kidney cancer Neg Hx     Social History   Socioeconomic History  . Marital status: Single  Spouse name: Not on file  . Number of children: Not on file  . Years of education: Not on file  . Highest education level: Not on file  Occupational History  . Not on file  Tobacco Use  . Smoking status: Never Smoker  . Smokeless tobacco: Never Used  Vaping Use  . Vaping Use: Never used  Substance and Sexual Activity  . Alcohol use: No  . Drug use: No  . Sexual activity: Yes    Birth control/protection: Surgical  Other Topics Concern  . Not on file  Social History Narrative  . Not on file   Social Determinants of Health   Financial Resource Strain: Not on file  Food Insecurity: Not on  file  Transportation Needs: Not on file  Physical Activity: Not on file  Stress: Not on file  Social Connections: Not on file  Intimate Partner Violence: Not on file      Review of Systems  Constitutional: Positive for chills and fatigue. Negative for fever.  HENT: Positive for congestion, ear pain, postnasal drip, rhinorrhea, sinus pain and sore throat. Negative for voice change.   Respiratory: Positive for cough and wheezing.   Cardiovascular: Negative for chest pain and palpitations.  Musculoskeletal: Positive for arthralgias and myalgias.  Skin: Negative.   Allergic/Immunologic: Positive for environmental allergies.  Neurological: Positive for headaches.    Today's Vitals   03/15/20 1132  Resp: 16  Weight: 175 lb (79.4 kg)  Height: 5\' 4"  (1.626 m)   Body mass index is 30.04 kg/m.  Observation/Objective:   The patient is alert and oriented. She is pleasant and answers all questions appropriately. Breathing is non-labored. She is in no acute distress at this time. She sounds nasally congested and her voice is hoarse. She does get winded after speaking a few words at a time.    Assessment/Plan: 1. Acute upper respiratory infection Start z-pack. Take as directed for 5 days. Rest and increase fluids. Add medrol dose pack. Take as prescribed for 6 days. May take OTC medication as needed to improve acute symptoms. Advised her to get tested for COVID 19 prior to approving her return to work. She voiced understanding and agreement  - azithromycin (ZITHROMAX) 250 MG tablet; z-pack - take as directed for 5 days  Dispense: 6 tablet; Refill: 0 - methylPREDNISolone (MEDROL) 4 MG TBPK tablet; Take by mouth as directed for 6 days  Dispense: 21 tablet; Refill: 0  2. Shortness of breath May use rescue inhaler up to four times daily as needed for shortness of breath or cough. Consider chest x-ray if symptoms no better after initial treatment.  - methylPREDNISolone (MEDROL) 4 MG TBPK  tablet; Take by mouth as directed for 6 days  Dispense: 21 tablet; Refill: 0 - albuterol (VENTOLIN HFA) 108 (90 Base) MCG/ACT inhaler; Inhale 2 puffs into the lungs every 6 (six) hours as needed for wheezing or shortness of breath.  Dispense: 1 each; Refill: 2  General Counseling: Kamerin verbalizes understanding of the findings of today's phone visit and agrees with plan of treatment. I have discussed any further diagnostic evaluation that may be needed or ordered today. We also reviewed her medications today. she has been encouraged to call the office with any questions or concerns that should arise related to todays visit.   This patient was seen by [place] with Dr Lavera Guise as a part of collaborative care agreement  Meds ordered this encounter  Medications  . azithromycin (ZITHROMAX) 250  MG tablet    Sig: z-pack - take as directed for 5 days    Dispense:  6 tablet    Refill:  0    Order Specific Question:   Supervising Provider    Answer:   Lyndon Code [[id number]]  . methylPREDNISolone (MEDROL) 4 MG TBPK tablet    Sig: Take by mouth as directed for 6 days    Dispense:  21 tablet    Refill:  0    Order Specific Question:   Supervising Provider    Answer:   Lyndon Code [[id number]]  . albuterol (VENTOLIN HFA) 108 (90 Base) MCG/ACT inhaler    Sig: Inhale 2 puffs into the lungs every 6 (six) hours as needed for wheezing or shortness of breath.    Dispense:  1 each    Refill:  2    Order Specific Question:   Supervising Provider    Answer:   Lyndon Code [[id number]]    Time spent: 33 Minutes    Dr Lyndon Code Internal medicine

## 2020-03-19 ENCOUNTER — Ambulatory Visit: Payer: Medicaid Other | Admitting: Hospice and Palliative Medicine

## 2020-04-02 DIAGNOSIS — J069 Acute upper respiratory infection, unspecified: Secondary | ICD-10-CM | POA: Insufficient documentation

## 2020-04-02 DIAGNOSIS — R0602 Shortness of breath: Secondary | ICD-10-CM | POA: Insufficient documentation

## 2020-04-15 ENCOUNTER — Encounter: Payer: Self-pay | Admitting: Physician Assistant

## 2020-04-15 ENCOUNTER — Ambulatory Visit (INDEPENDENT_AMBULATORY_CARE_PROVIDER_SITE_OTHER): Payer: Medicaid Other | Admitting: Physician Assistant

## 2020-04-15 DIAGNOSIS — R0602 Shortness of breath: Secondary | ICD-10-CM | POA: Diagnosis not present

## 2020-04-15 DIAGNOSIS — J320 Chronic maxillary sinusitis: Secondary | ICD-10-CM

## 2020-04-15 DIAGNOSIS — I10 Essential (primary) hypertension: Secondary | ICD-10-CM | POA: Diagnosis not present

## 2020-04-15 DIAGNOSIS — K219 Gastro-esophageal reflux disease without esophagitis: Secondary | ICD-10-CM

## 2020-04-15 DIAGNOSIS — R1013 Epigastric pain: Secondary | ICD-10-CM

## 2020-04-15 NOTE — Progress Notes (Signed)
[facility] [address] [place], [place] [address]  Internal MEDICINE  Office Visit Note  Patient Name: Ashley Mcpherson  [id number]  [id number]  Date of Service: 04/17/2020  Chief Complaint  Patient presents with  . Sinusitis  . Hernia     HPI Pt is here for a sick visit. She has been coughing for several months and has on and off congestion. It would go away for a bit then come back. Denies fever or chills, body aches, headaches, or loss of taste. She admits she loses her sense of smell when the congestion is bad and then it comes back. She can smell currently. OTC meds include [facility] cough medication. She has not used a decongestant or nasal spray. She has never been a smoker and has no hx of any lung conditions. Denies wheezing or SOB. Current cough started Sunday night and is improving now, but work will not let her come in with this cough. She was previously given Zpack, medrol and albuterol as needed for a similar occurrence in the beginning of Jan. She did a covid test last time in January which was negative. She has not taken another test this occurrence. She did have covid 1 year ago and thinks any cold since then has been intensified. She does not know if she has enviromental allergies but is open to trying OTC allergy pill and nasal spray. She is vaccinated to covid, but booster is set for later this month. She did not get a flu vaccine this year. She also thinks she has a hernia in her abdomen and has been having some epigastric pain and reflux.  Current Medication:  Outpatient Encounter Medications as of 04/15/2020  Medication Sig  . losartan (COZAAR) 25 MG tablet Take 1 tablet (25 mg total) by mouth daily.  Marland Kitchen albuterol (VENTOLIN HFA) 108 (90 Base) MCG/ACT inhaler Inhale 2 puffs into the lungs every 6 (six) hours as needed for wheezing or shortness of breath.  Marland Kitchen azithromycin (ZITHROMAX) 250 MG tablet z-pack - take as directed for 5 days  . escitalopram (LEXAPRO) 5 MG  tablet Take 1 tablet (5 mg total) by mouth daily.  . famciclovir (FAMVIR) 500 MG tablet Take 1 tablet (500 mg total) by mouth 2 (two) times daily.  Marland Kitchen linaclotide (LINZESS) 290 MCG CAPS capsule Take 290 mcg by mouth daily before breakfast.  . methylPREDNISolone (MEDROL) 4 MG TBPK tablet Take by mouth as directed for 6 days  . omeprazole (PRILOSEC) 40 MG capsule Take 40 mg by mouth daily.  . polyethylene glycol powder (GLYCOLAX/MIRALAX) 17 GM/SCOOP powder Take 17 g by mouth daily as needed for moderate constipation.  Marland Kitchen zolpidem (AMBIEN) 5 MG tablet Take 1 tablet (5 mg total) by mouth at bedtime as needed for sleep.   No facility-administered encounter medications on file as of 04/15/2020.      Medical History: Past Medical History:  Diagnosis Date  . Hypertension   . IBS (irritable bowel syndrome)   . Kidney stone      Vital Signs: BP (!) 159/100   Pulse 78   Temp 97.6 F (36.4 C)   Resp 16   Ht 5\' 4"  (1.626 m)   Wt 172 lb (78 kg)   SpO2 99%   BMI 29.52 kg/m    Review of Systems  Constitutional: Negative for chills, fatigue and fever.  HENT: Positive for congestion and postnasal drip. Negative for mouth sores.   Respiratory: Positive for cough. Negative for shortness of breath  and wheezing.   Cardiovascular: Negative for chest pain.  Gastrointestinal: Positive for abdominal pain.       Epigastric pain with concern for hernia, also refluxing  Genitourinary: Negative for flank pain.  Neurological: Negative for headaches.  Psychiatric/Behavioral: Negative.     Physical Exam Constitutional:      General: She is not in acute distress.    Appearance: She is well-developed. She is not diaphoretic.  HENT:     Head: Normocephalic and atraumatic.     Nose: Congestion present.     Mouth/Throat:     Pharynx: No oropharyngeal exudate.  Eyes:     Pupils: Pupils are equal, round, and reactive to light.  Neck:     Thyroid: No thyromegaly.     Vascular: No JVD.     Trachea: No  tracheal deviation.  Cardiovascular:     Rate and Rhythm: Normal rate and regular rhythm.     Heart sounds: Normal heart sounds. No murmur heard. No friction rub. No gallop.   Pulmonary:     Effort: Pulmonary effort is normal. No respiratory distress.     Breath sounds: No wheezing or rales.  Chest:     Chest wall: No tenderness.  Abdominal:     General: Bowel sounds are normal.     Palpations: Abdomen is soft.     Tenderness: There is abdominal tenderness.     Comments: Epigastric tenderness, no mass or hernia palpated  Musculoskeletal:        General: Normal range of motion.     Cervical back: Normal range of motion and neck supple.  Lymphadenopathy:     Cervical: No cervical adenopathy.  Skin:    General: Skin is warm and dry.  Neurological:     Mental Status: She is alert and oriented to person, place, and time.     Cranial Nerves: No cranial nerve deficit.  Psychiatric:        Behavior: Behavior normal.        Thought Content: Thought content normal.        Judgment: Judgment normal.       Assessment/Plan: 1. Chronic sinusitis of both maxillary sinuses She has recently completed zpak and medrol. Encouraged to try mucinex and flonase nasal spray and we will obtain a CT of sinuses. She will also try taking an OTC allergy pill such as allegra orr zyrtec. Will consider allergy testing pending CT. - CT MAXILLOFACIAL WO CONTRAST; Future  2. SOB (shortness of breath) Chronic cough without history of pulmonary disease or smoking history. She does have an albuterol inhaler at home to be used as needed. - Spirometry with Graph normal  3. Essential hypertension She will restart on losartan and is committed to taking it this time. Repeat blood pressure  - losartan (COZAAR) 25 MG tablet; Take 1 tablet (25 mg total) by mouth daily.  Dispense: 30 tablet; Refill: 2  4. Gastroesophageal reflux disease, unspecified whether esophagitis present Encouraged to take omeprazole daily and  will obtain an upper GI. - DG UGI W SINGLE CM (SOL OR THIN BA); Future  5. Epigastric pain Will start with upper GI and reevaluate after results. - DG UGI W SINGLE CM (SOL OR THIN BA); Future  General Counseling: Kameisha verbalizes understanding of the findings of todays visit and agrees with plan of treatment. I have discussed any further diagnostic evaluation that may be needed or ordered today. We also reviewed her medications today. she has been encouraged to call the office  with any questions or concerns that should arise related to todays visit.    Counseling: Hypertension Counseling:   The following hypertensive lifestyle modification were recommended and discussed:  1. Limiting alcohol intake to less than 1 oz/day of ethanol:(24 oz of beer or 8 oz of wine or 2 oz of 100-proof whiskey). 2. Take baby ASA 81 mg daily. 3. Importance of regular aerobic exercise and losing weight. 4. Reduce dietary saturated fat and cholesterol intake for overall cardiovascular health. 5. Maintaining adequate dietary potassium, calcium, and magnesium intake. 6. Regular monitoring of the blood pressure. 7. Reduce sodium intake to less than 100 mmol/day (less than 2.3 gm of sodium or less than 6 gm of sodium choride)    Orders Placed This Encounter  Procedures  . CT MAXILLOFACIAL WO CONTRAST  . DG UGI W SINGLE CM (SOL OR THIN BA)  . Spirometry with Graph    Meds ordered this encounter  Medications  . losartan (COZAAR) 25 MG tablet    Sig: Take 1 tablet (25 mg total) by mouth daily.    Dispense:  30 tablet    Refill:  2    Time spent:30 Minutes

## 2020-04-16 ENCOUNTER — Telehealth: Payer: Self-pay

## 2020-04-16 MED ORDER — LOSARTAN POTASSIUM 25 MG PO TABS: 25.0000 mg | ORAL_TABLET | Freq: Every day | ORAL | 2 refills | Status: DC

## 2020-04-20 NOTE — Telephone Encounter (Signed)
Med sent to pharmacy.

## 2020-05-03 ENCOUNTER — Ambulatory Visit
Admission: EM | Admit: 2020-05-03 | Discharge: 2020-05-03 | Disposition: A | Discharge status: Home / Self Care | Payer: Medicaid Other | Attending: Family Medicine | Admitting: Family Medicine

## 2020-05-03 ENCOUNTER — Other Ambulatory Visit: Payer: Self-pay

## 2020-05-03 ENCOUNTER — Other Ambulatory Visit: Payer: Self-pay | Admitting: Physician Assistant

## 2020-05-03 ENCOUNTER — Ambulatory Visit
Admission: RE | Admit: 2020-05-03 | Discharge: 2020-05-03 | Disposition: A | Discharge status: Home / Self Care | Payer: Medicaid Other | Source: Ambulatory Visit | Attending: Physician Assistant | Admitting: Physician Assistant

## 2020-05-03 DIAGNOSIS — R350 Frequency of micturition: Secondary | ICD-10-CM

## 2020-05-03 DIAGNOSIS — R1013 Epigastric pain: Secondary | ICD-10-CM | POA: Insufficient documentation

## 2020-05-03 DIAGNOSIS — H8110 Benign paroxysmal vertigo, unspecified ear: Secondary | ICD-10-CM

## 2020-05-03 DIAGNOSIS — B354 Tinea corporis: Secondary | ICD-10-CM | POA: Diagnosis not present

## 2020-05-03 DIAGNOSIS — K219 Gastro-esophageal reflux disease without esophagitis: Secondary | ICD-10-CM | POA: Insufficient documentation

## 2020-05-03 DIAGNOSIS — R42 Dizziness and giddiness: Secondary | ICD-10-CM

## 2020-05-03 LAB — POCT URINALYSIS DIP (MANUAL ENTRY)
Bilirubin, UA: NEGATIVE
Glucose, UA: NEGATIVE mg/dL
Leukocytes, UA: NEGATIVE
Nitrite, UA: NEGATIVE
Protein Ur, POC: NEGATIVE mg/dL
Spec Grav, UA: 1.025 (ref 1.010–1.025)
Urobilinogen, UA: 2 E.U./dL — AB
pH, UA: 6.5 (ref 5.0–8.0)

## 2020-05-03 MED ORDER — FLUCONAZOLE 150 MG PO TABS: 150.0000 mg | ORAL_TABLET | ORAL | 0 refills | Status: AC

## 2020-05-03 MED ORDER — MECLIZINE HCL 25 MG PO TABS: 25.0000 mg | ORAL_TABLET | Freq: Three times a day (TID) | ORAL | 0 refills | Status: DC | PRN

## 2020-05-03 NOTE — ED Triage Notes (Signed)
Pt c/o rash to right medial leg from thigh to calf, left ankle, arms that itches for past couple months, pt also c/o intermittent dizziness, fatigue, malaise and CP tight in nature, persistent nausea. Urinary frequency, lower back pain. H/o kidney stones.  Reports increase incidence stress the past few months. Pt unable to get Rx losartan 2/2 awaiting approval from [place].   Denies CP at present, SOB, vomiting, burning with urination, urgency, hematuria, abdominal pain.

## 2020-05-03 NOTE — Discharge Instructions (Signed)
You have vertigo.  Take meclizine as needed for dizziness.  Drink lots of fluids.    Your rash is most likely fungal.  Take the Diflucan once a week for 4 weeks.  You may also use benadryl and zyrtec as needed for the itch.   Follow up and with a primary care doctor.    Return or go to the ER if your symptoms do not improve or get worse over the next few days.

## 2020-05-03 NOTE — ED Provider Notes (Signed)
[name]    CSN: [id number] Arrival date & time: 05/03/20  1445      History   Chief Complaint Chief Complaint  Patient presents with  . Rash  . Dizziness    HPI Ashley Mcpherson is a 45 y.o. female.   Ashley Mcpherson is a 45 year old patient with complaint of dizziness and rash.    The history is provided by the patient.  Rash Location:  Leg and shoulder/arm Shoulder/arm rash location:  L forearm Leg rash location:  L leg and R leg Quality: itchiness and scaling   Severity:  Moderate Onset quality:  Gradual Timing:  Intermittent Progression:  Waxing and waning Chronicity:  New Context: not animal contact, not chemical exposure, not exposure to similar rash, not hot tub use, not insect bite/sting, not medications, not new detergent/soap, not plant contact, not sick contacts and not sun exposure   Relieved by:  Anti-itch cream Worsened by:  Nothing Ineffective treatments:  Moisturizers Associated symptoms: no fatigue, no fever, no headaches and no shortness of breath   Dizziness Quality:  Vertigo Severity:  Mild Onset quality:  Gradual Timing:  Intermittent Progression:  Waxing and waning Chronicity:  New Context: head movement   Relieved by:  Nothing Worsened by:  Nothing Ineffective treatments:  None tried Associated symptoms: no headaches, no palpitations, no shortness of breath, no syncope, no tinnitus, no vision changes and no weakness     Past Medical History:  Diagnosis Date  . Hypertension   . IBS (irritable bowel syndrome)   . Kidney stone     Patient Active Problem List   Diagnosis Date Noted  . Acute upper respiratory infection 04/02/2020  . Shortness of breath 04/02/2020  . Encounter for school history and physical examination 03/02/2019  . 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 Local superficial swelling, mass or lump 08/13/2018  . Constipation 08/13/2018  . Umbilical hernia without obstruction and without gangrene  08/07/2018  . Flank pain 06/02/2018  . Renal calculi 06/02/2018  . Encounter for general adult medical examination with abnormal findings 11/28/2017  . Post traumatic stress disorder 11/28/2017  . Urinary tract infection with hematuria 08/12/2017  . Dysuria 08/12/2017  . Chronic bilateral low back pain 08/12/2017  . Essential hypertension 04/17/2017    Past Surgical History:  Procedure Laterality Date  . CRYOABLATION  2010  . TUBAL LIGATION  2007  . VAGINAL HYSTERECTOMY  2013   Dr. Logan [facility])    OB History   No obstetric history on file.      Home Medications    Prior to Admission medications   Medication Sig Start Date End Date Taking? Authorizing Provider  fluconazole (DIFLUCAN) 150 MG tablet Take 1 tablet (150 mg total) by mouth once a week for 4 doses. Take one pill once a week for 4 weeks 05/03/20 05/25/20 Yes Ivette Loyal, NP  meclizine (ANTIVERT) 25 MG tablet Take 1 tablet (25 mg total) by mouth 3 (three) times daily as needed for dizziness. 05/03/20  Yes Ivette Loyal, NP  albuterol (VENTOLIN HFA) 108 (90 Base) MCG/ACT inhaler Inhale 2 puffs into the lungs every 6 (six) hours as needed for wheezing or shortness of breath. 03/15/20   Carlean Jews, NP  azithromycin (ZITHROMAX) 250 MG tablet z-pack - take as directed for 5 days 03/15/20   Carlean Jews, NP  escitalopram (LEXAPRO) 5 MG tablet Take 1 tablet (5 mg total) by mouth daily. 02/23/20   Tiburcio Pea,  Lubertha Basqueaylor S, NP  famciclovir (FAMVIR) 500 MG tablet Take 1 tablet (500 mg total) by mouth 2 (two) times daily. 02/10/20   Theotis BurrowHarris, Taylor S, NP  linaclotide (LINZESS) 290 MCG CAPS capsule Take 290 mcg by mouth daily before breakfast.    [provider]  losartan (COZAAR) 25 MG tablet Take 1 tablet (25 mg total) by mouth daily. 04/16/20   McDonough, Salomon FickLauren K, PA-C  methylPREDNISolone (MEDROL) 4 MG TBPK tablet Take by mouth as directed for 6 days 03/15/20   Carlean JewsBoscia, Heather E, NP  omeprazole (PRILOSEC) 40 MG capsule  Take 40 mg by mouth daily.    [provider]  polyethylene glycol powder (GLYCOLAX/MIRALAX) 17 GM/SCOOP powder Take 17 g by mouth daily as needed for moderate constipation. 07/29/18   Minna AntisPaduchowski, Kevin, MD  zolpidem (AMBIEN) 5 MG tablet Take 1 tablet (5 mg total) by mouth at bedtime as needed for sleep. 02/23/20   Theotis BurrowHarris, Taylor S, NP    Family History Family History  Problem Relation Age of Onset  . Diabetes Mother   . Hypertension Mother   . Diabetes Father   . Hypertension Father   . Bladder Cancer Neg Hx   . Kidney cancer Neg Hx     Social History Social History   Tobacco Use  . Smoking status: Never Smoker  . Smokeless tobacco: Never Used  Vaping Use  . Vaping Use: Never used  Substance Use Topics  . Alcohol use: No  . Drug use: No     Allergies   Sulfa antibiotics   Review of Systems Review of Systems  Constitutional: Negative for fatigue and fever.  HENT: Negative for sinus pressure, sinus pain and tinnitus.   Respiratory: Negative for shortness of breath.   Cardiovascular: Negative for palpitations and syncope.  Genitourinary: Positive for flank pain.  Skin: Positive for rash.  Neurological: Positive for dizziness. Negative for weakness and headaches.     Physical Exam Triage Vital Signs ED Triage Vitals  Enc Vitals Group     BP 05/03/20 1512 (!) 164/99     Pulse Rate 05/03/20 1512 86     Resp 05/03/20 1512 18     Temp 05/03/20 1512 98.2 F (36.8 C)     Temp Source 05/03/20 1512 Oral     SpO2 05/03/20 1512 98 %     Weight --      Height --      Head Circumference --      Peak Flow --      Pain Score 05/03/20 1516 4     Pain Loc --      Pain Edu? --      Excl. in GC? --    No data found.  Updated Vital Signs BP (!) 164/99 (BP Location: Left Arm)   Pulse 86   Temp 98.2 F (36.8 C) (Oral)   Resp 18   SpO2 98%   Visual Acuity Right Eye Distance:   Left Eye Distance:   Bilateral Distance:    Right Eye Near:   Left Eye  Near:    Bilateral Near:     Physical Exam Constitutional:      General: She is not in acute distress.    Appearance: She is not ill-appearing or diaphoretic.  HENT:     Head: Normocephalic and atraumatic.     Right Ear: Tympanic membrane normal.     Left Ear: Tympanic membrane normal.     Nose: Nose normal.  Mouth/Throat:     Mouth: Mucous membranes are moist.     Pharynx: No oropharyngeal exudate or posterior oropharyngeal erythema.  Eyes:     Extraocular Movements: Extraocular movements intact.     Conjunctiva/sclera: Conjunctivae normal.     Pupils: Pupils are equal, round, and reactive to light.  Cardiovascular:     Pulses: Normal pulses.     Heart sounds: Normal heart sounds.  Pulmonary:     Effort: Pulmonary effort is normal.     Breath sounds: Normal breath sounds.  Abdominal:     Tenderness: There is no right CVA tenderness or left CVA tenderness.  Musculoskeletal:        General: Normal range of motion.     Cervical back: Normal range of motion. No rigidity.  Skin:    General: Skin is warm.     Findings: Rash present. Rash is scaling.     Comments: Lower extremities rash  Neurological:     General: No focal deficit present.     Mental Status: She is alert and oriented to person, place, and time.     GCS: GCS eye subscore is 4. GCS verbal subscore is 5. GCS motor subscore is 6.     Motor: Motor function is intact.     Gait: Gait is intact.  Psychiatric:        Mood and Affect: Mood normal.      UC Treatments / Results  Labs (all labs ordered are listed, but only abnormal results are displayed) Labs Reviewed  POCT URINALYSIS DIP (MANUAL ENTRY) - Abnormal; Notable for the following components:      Result Value   Ketones, POC UA trace (5) (*)    Blood, UA trace-lysed (*)    Urobilinogen, UA 2.0 (*)    All other components within normal limits    EKG   Radiology DG UGI W DOUBLE CM (HD BA)  Result Date: 05/03/2020 CLINICAL DATA:  Abdominal pain,  esophagitis EXAM: UPPER GI SERIES WITHOUT KUB TECHNIQUE: Routine upper GI series was performed with thin/high density/water soluble barium. FLUOROSCOPY TIME:  Fluoroscopy Time:  0.6 minutes Radiation Exposure Index (if provided by the fluoroscopic device): 4.6 mGy Number of Acquired Spot Images: 0 COMPARISON:  None. FINDINGS: Examination of the esophagus demonstrated normal esophageal motility. Normal esophageal morphology without evidence of esophagitis or ulceration. No esophageal stricture, diverticula, or mass lesion. No evidence of hiatal hernia. No spontaneous or inducible gastroesophageal reflux. Examination of the stomach demonstrated normal rugal folds and areae gastricae. The gastric mucosa appeared unremarkable without evidence of ulceration, scarring, or mass lesion. Gastric motility and emptying was normal. Fluoroscopic examination of the duodenum demonstrates normal motility and morphology without evidence of ulceration or mass lesion. IMPRESSION: 1. Normal upper GI series. Electronically Signed   By: Elige Ko   On: 05/03/2020 16:10    Procedures Procedures (including critical care time)  Medications Ordered in UC Medications - No data to display  Initial Impression / Assessment and Plan / UC Course  I have reviewed the triage vital signs and the nursing notes.  Pertinent labs & imaging results that were available during my care of the patient were reviewed by me and considered in my medical decision making (see chart for details).     Dizziness, most likely vertigo.   EKG showed normal sinus rhythm and normal rate.  No concerns for ACS.   No focal neuro deficits or concerns for CVA.  Urine negative for infection, but may be  mildly dehydrated.  Encouraged fluid intake.  Flank pain related to known kidney stones, assessment negative for red flags.   Meclizine given for dizziness as needed.    Rash most likely fungal.   Fluconazole once a week for 4 weeks for treatment.  May  use benadryl or zyrtec for itchiness and symptom management.    Encouraged to get established with a primary care provider.    Final Clinical Impressions(s) / UC Diagnoses   Final diagnoses:  Benign paroxysmal positional vertigo, unspecified laterality  Tinea corporis     Discharge Instructions     You have vertigo.  Take meclizine as needed for dizziness.  Drink lots of fluids.    Your rash is most likely fungal.  Take the Diflucan once a week for 4 weeks.  You may also use benadryl and zyrtec as needed for the itch.   Follow up and with a primary care doctor.    Return or go to the ER if your symptoms do not improve or get worse over the next few days.     ED Prescriptions    Medication Sig Dispense Auth. Provider   fluconazole (DIFLUCAN) 150 MG tablet Take 1 tablet (150 mg total) by mouth once a week for 4 doses. Take one pill once a week for 4 weeks 4 tablet Ivette Loyal, NP   meclizine (ANTIVERT) 25 MG tablet Take 1 tablet (25 mg total) by mouth 3 (three) times daily as needed for dizziness. 30 tablet Ivette Loyal, NP     PDMP not reviewed this encounter.   Ivette Loyal, NP 05/03/20 [phone]

## 2020-05-06 ENCOUNTER — Ambulatory Visit: Payer: Medicaid Other

## 2020-05-13 ENCOUNTER — Ambulatory Visit: Payer: Medicaid Other | Admitting: Physician Assistant

## 2020-05-17 ENCOUNTER — Other Ambulatory Visit: Payer: Self-pay | Admitting: Hospice and Palliative Medicine

## 2020-05-27 ENCOUNTER — Ambulatory Visit: Payer: Medicaid Other | Admitting: Physician Assistant

## 2020-07-01 ENCOUNTER — Encounter: Payer: Self-pay | Admitting: Physician Assistant

## 2020-07-01 ENCOUNTER — Other Ambulatory Visit: Payer: Self-pay

## 2020-07-01 ENCOUNTER — Ambulatory Visit: Payer: Medicaid Other | Admitting: Physician Assistant

## 2020-07-01 DIAGNOSIS — I10 Essential (primary) hypertension: Secondary | ICD-10-CM | POA: Diagnosis not present

## 2020-07-01 DIAGNOSIS — R1013 Epigastric pain: Secondary | ICD-10-CM

## 2020-07-01 DIAGNOSIS — F411 Generalized anxiety disorder: Secondary | ICD-10-CM | POA: Diagnosis not present

## 2020-07-01 DIAGNOSIS — G4700 Insomnia, unspecified: Secondary | ICD-10-CM | POA: Diagnosis not present

## 2020-07-01 DIAGNOSIS — K219 Gastro-esophageal reflux disease without esophagitis: Secondary | ICD-10-CM

## 2020-07-01 MED ORDER — ESCITALOPRAM OXALATE 5 MG PO TABS: 5.0000 mg | ORAL_TABLET | Freq: Every day | ORAL | 2 refills | Status: DC

## 2020-07-01 MED ORDER — ZOLPIDEM TARTRATE 5 MG PO TABS: 5.0000 mg | ORAL_TABLET | Freq: Every evening | ORAL | 1 refills | Status: DC | PRN

## 2020-07-01 NOTE — Progress Notes (Signed)
[facility] [address] [place], [place] [address]  Internal MEDICINE  Office Visit Note  Patient Name: Ashley Mcpherson  [id number]  [id number]  Date of Service: 07/02/2020  Chief Complaint  Patient presents with  . Hypertension  . Quality Metric Gaps    colonoscopy    HPI Pt is here for follow up -Upper Gi normal, does still feel reflux and omeprazole helps. Abdominal pain all over is constant. Takes Linzess but knows she has to be home to take it and hasnt taken it regularly for this reason and has not increased fiber intake. She remains very constipated and thinks this is what causes constant abdominal discomfort. Encouraged to take linzess regularly and increase fiber. -sometimes worse after eating but diffuse. Discussed obtaining an endoscopy and colonoscopy or an abdominal US for further investigation. No Fhx of colon Ca, but mom does have pancreatic Ca. Pt does not want to pursue either of these tests right now because she cannot miss any more work right now and will consider them in the future. -Sinuses have been better despite pollen increase -She did not really take the lexapro daily to see any benefits. Did take the [place] and found that beneficial. Explained that she needs to take lexapro daily and that the [place] should only be for short term as needed use while lexapro takes effect.  Current Medication: Outpatient Encounter Medications as of 07/01/2020  Medication Sig  . albuterol (VENTOLIN HFA) 108 (90 Base) MCG/ACT inhaler Inhale 2 puffs into the lungs every 6 (six) hours as needed for wheezing or shortness of breath.  Marland Kitchen azithromycin (ZITHROMAX) 250 MG tablet z-pack - take as directed for 5 days  . famciclovir (FAMVIR) 500 MG tablet TAKE 1 TABLET(500 MG) BY MOUTH TWICE DAILY  . linaclotide (LINZESS) 290 MCG CAPS capsule Take 290 mcg by mouth daily before breakfast.  . losartan (COZAAR) 25 MG tablet Take 1 tablet (25 mg total) by mouth daily.  . meclizine  (ANTIVERT) 25 MG tablet Take 1 tablet (25 mg total) by mouth 3 (three) times daily as needed for dizziness.  . methylPREDNISolone (MEDROL) 4 MG TBPK tablet Take by mouth as directed for 6 days  . omeprazole (PRILOSEC) 40 MG capsule Take 40 mg by mouth daily.  . polyethylene glycol powder (GLYCOLAX/MIRALAX) 17 GM/SCOOP powder Take 17 g by mouth daily as needed for moderate constipation.  . [DISCONTINUED] escitalopram (LEXAPRO) 5 MG tablet Take 1 tablet (5 mg total) by mouth daily.  . [DISCONTINUED] zolpidem (AMBIEN) 5 MG tablet Take 1 tablet (5 mg total) by mouth at bedtime as needed for sleep.  Marland Kitchen escitalopram (LEXAPRO) 5 MG tablet Take 1 tablet (5 mg total) by mouth daily.  Marland Kitchen zolpidem (AMBIEN) 5 MG tablet Take 1 tablet (5 mg total) by mouth at bedtime as needed for sleep.   No facility-administered encounter medications on file as of 07/01/2020.    Surgical History: Past Surgical History:  Procedure Laterality Date  . CRYOABLATION  2010  . TUBAL LIGATION  2007  . VAGINAL HYSTERECTOMY  2013   Dr. Logan Bores [facility])    Medical History: Past Medical History:  Diagnosis Date  . Hypertension   . IBS (irritable bowel syndrome)   . Kidney stone     Family History: Family History  Problem Relation Age of Onset  . Diabetes Mother   . Hypertension Mother   . Diabetes Father   . Hypertension Father   . Bladder Cancer Neg Hx   . Kidney  cancer Neg Hx     Social History   Socioeconomic History  . Marital status: Single    Spouse name: Not on file  . Number of children: Not on file  . Years of education: Not on file  . Highest education level: Not on file  Occupational History  . Not on file  Tobacco Use  . Smoking status: Never Smoker  . Smokeless tobacco: Never Used  Vaping Use  . Vaping Use: Never used  Substance and Sexual Activity  . Alcohol use: No  . Drug use: No  . Sexual activity: Yes    Birth control/protection: Surgical  Other Topics Concern  . Not on file  Social  History Narrative  . Not on file   Social Determinants of Health   Financial Resource Strain: Not on file  Food Insecurity: Not on file  Transportation Needs: Not on file  Physical Activity: Not on file  Stress: Not on file  Social Connections: Not on file  Intimate Partner Violence: Not on file      Review of Systems  Constitutional: Negative for chills, fatigue and unexpected weight change.  HENT: Positive for postnasal drip. Negative for congestion, rhinorrhea, sneezing and sore throat.   Eyes: Negative for redness.  Respiratory: Negative for cough, chest tightness and shortness of breath.   Cardiovascular: Negative for chest pain and palpitations.  Gastrointestinal: Positive for abdominal pain and constipation. Negative for diarrhea, nausea and vomiting.  Genitourinary: Negative for dysuria and frequency.  Musculoskeletal: Negative for arthralgias, back pain, joint swelling and neck pain.  Skin: Negative for rash.  Neurological: Negative.  Negative for tremors and numbness.  Hematological: Negative for adenopathy. Does not bruise/bleed easily.  Psychiatric/Behavioral: Positive for sleep disturbance. Negative for behavioral problems (Depression) and suicidal ideas. The patient is nervous/anxious.     Vital Signs: BP 140/79   Pulse 82   Temp 98.4 F (36.9 C)   Resp 16   Ht 5\' 4"  (1.626 m)   Wt 174 lb (78.9 kg)   SpO2 99%   BMI 29.87 kg/m    Physical Exam Vitals and nursing note reviewed.  Constitutional:      General: She is not in acute distress.    Appearance: She is well-developed. She is obese. She is not diaphoretic.  HENT:     Head: Normocephalic and atraumatic.     Mouth/Throat:     Pharynx: No oropharyngeal exudate.  Eyes:     Pupils: Pupils are equal, round, and reactive to light.  Neck:     Thyroid: No thyromegaly.     Vascular: No JVD.     Trachea: No tracheal deviation.  Cardiovascular:     Rate and Rhythm: Normal rate and regular rhythm.      Heart sounds: Normal heart sounds. No murmur heard. No friction rub. No gallop.   Pulmonary:     Effort: Pulmonary effort is normal. No respiratory distress.     Breath sounds: No wheezing or rales.  Chest:     Chest wall: No tenderness.  Abdominal:     General: Bowel sounds are normal. There is no distension.     Palpations: Abdomen is soft. There is no mass.     Tenderness: There is abdominal tenderness. There is no guarding.     Hernia: No hernia is present.  Musculoskeletal:        General: Normal range of motion.     Cervical back: Normal range of motion and neck supple.  Lymphadenopathy:  Cervical: No cervical adenopathy.  Skin:    General: Skin is warm and dry.  Neurological:     Mental Status: She is alert and oriented to person, place, and time.     Cranial Nerves: No cranial nerve deficit.  Psychiatric:        Behavior: Behavior normal.        Thought Content: Thought content normal.        Judgment: Judgment normal.        Assessment/Plan: 1. GAD (generalized anxiety disorder) Will start lexapro daily to help improve anxiety and improve sleep - escitalopram (LEXAPRO) 5 MG tablet; Take 1 tablet (5 mg total) by mouth daily.  Dispense: 30 tablet; Refill: 2  2. Insomnia, unspecified type May take ambien short term while lexapro takes effect--explained this is only for as needed short term use - zolpidem (AMBIEN) 5 MG tablet; Take 1 tablet (5 mg total) by mouth at bedtime as needed for sleep.  Dispense: 15 tablet; Refill: 1  3. Gastroesophageal reflux disease, unspecified whether esophagitis present Continue omeprazole. Upper GI normal  4. Essential hypertension Stable, Continue losartan  5. Epigastric pain Upper GI was normal, discussed obtaining an abdominal US, as well as potentially referring to GI for endoscopy and colonoscopy. Patient declines further testing at this time. Tenderness appears more diffused on exam today and may correlate with  constipation--take linzess regularly and increase fiber intake   General Counseling: Cattie verbalizes understanding of the findings of todays visit and agrees with plan of treatment. I have discussed any further diagnostic evaluation that may be needed or ordered today. We also reviewed her medications today. she has been encouraged to call the office with any questions or concerns that should arise related to todays visit.    No orders of the defined types were placed in this encounter.   Meds ordered this encounter  Medications  . escitalopram (LEXAPRO) 5 MG tablet    Sig: Take 1 tablet (5 mg total) by mouth daily.    Dispense:  30 tablet    Refill:  2  . zolpidem (AMBIEN) 5 MG tablet    Sig: Take 1 tablet (5 mg total) by mouth at bedtime as needed for sleep.    Dispense:  15 tablet    Refill:  1    This patient was seen by Lynn Ito, PA-C in collaboration with Dr. Beverely Risen as a part of collaborative care agreement.   Total time spent:30 Minutes Time spent includes review of chart, medications, test results, and follow up plan with the patient.      Dr Lyndon Code Internal medicine

## 2020-09-07 DIAGNOSIS — E782 Mixed hyperlipidemia: Secondary | ICD-10-CM | POA: Diagnosis not present

## 2020-09-07 DIAGNOSIS — E559 Vitamin D deficiency, unspecified: Secondary | ICD-10-CM | POA: Insufficient documentation

## 2020-09-07 DIAGNOSIS — Z131 Encounter for screening for diabetes mellitus: Secondary | ICD-10-CM | POA: Diagnosis not present

## 2020-09-07 DIAGNOSIS — G4700 Insomnia, unspecified: Secondary | ICD-10-CM | POA: Insufficient documentation

## 2020-09-07 DIAGNOSIS — I10 Essential (primary) hypertension: Secondary | ICD-10-CM | POA: Diagnosis not present

## 2020-09-07 DIAGNOSIS — F411 Generalized anxiety disorder: Secondary | ICD-10-CM | POA: Diagnosis not present

## 2020-09-07 DIAGNOSIS — Z87442 Personal history of urinary calculi: Secondary | ICD-10-CM | POA: Insufficient documentation

## 2020-09-07 DIAGNOSIS — D563 Thalassemia minor: Secondary | ICD-10-CM | POA: Insufficient documentation

## 2020-09-07 DIAGNOSIS — K219 Gastro-esophageal reflux disease without esophagitis: Secondary | ICD-10-CM | POA: Insufficient documentation

## 2020-09-07 DIAGNOSIS — R350 Frequency of micturition: Secondary | ICD-10-CM | POA: Diagnosis not present

## 2020-09-07 LAB — HEMOGLOBIN A1C: Hemoglobin A1C: 5.8

## 2020-09-08 DIAGNOSIS — R8290 Unspecified abnormal findings in urine: Secondary | ICD-10-CM | POA: Diagnosis not present

## 2020-09-30 ENCOUNTER — Ambulatory Visit: Payer: Medicaid Other | Admitting: Physician Assistant

## 2020-10-14 ENCOUNTER — Other Ambulatory Visit: Payer: Self-pay

## 2020-10-14 ENCOUNTER — Encounter: Payer: Self-pay | Admitting: Internal Medicine

## 2020-10-23 IMAGING — CT CT RENAL STONE PROTOCOL
3 of 4 series · 9 of 46 positions shown, 16 images · non-contrast
Comparison: 03/26/2018

CLINICAL DATA: Right-sided pain

EXAM:
CT ABDOMEN AND PELVIS WITHOUT CONTRAST
TECHNIQUE: Multidetector CT imaging of the abdomen and pelvis was performed
following the standard protocol without IV contrast.

[Series 4: lung bases · axial · 0.67mm/px · z∈[-218,-143]mm · 5 of 23 slices shown, 10 images]
[im 4/23  soft-tissue]
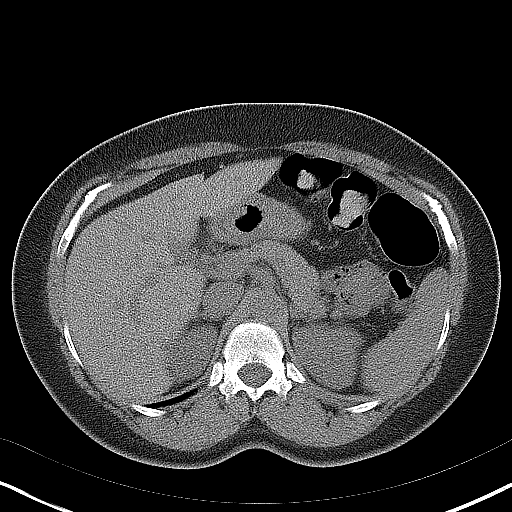
[im 4/23  bone]
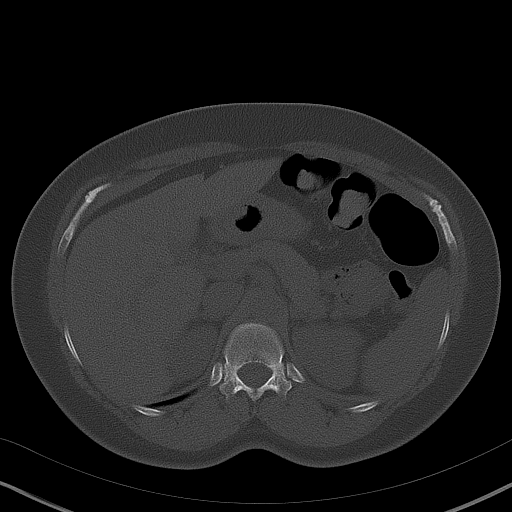
[im 8/23  soft-tissue]
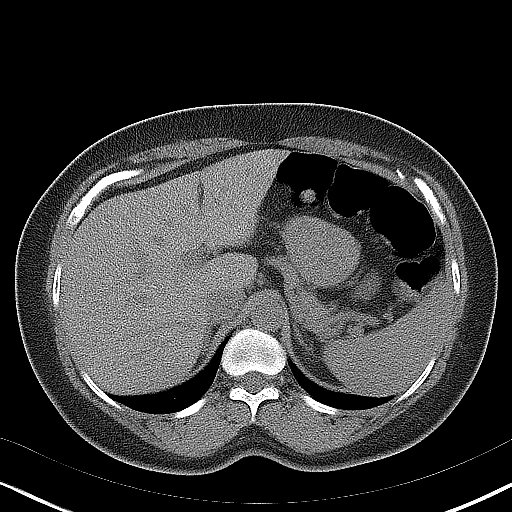
[im 8/23  lung]
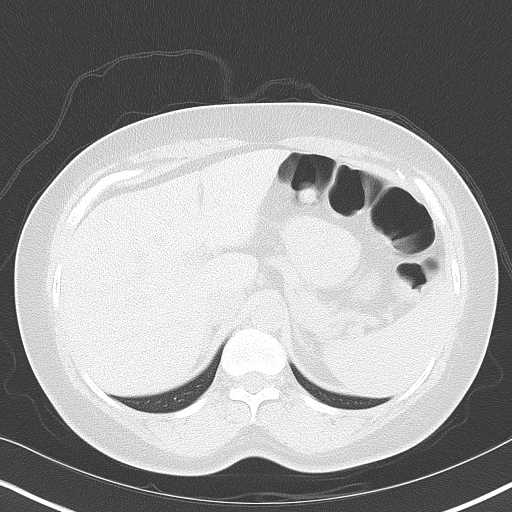
[im 12/23  soft-tissue]
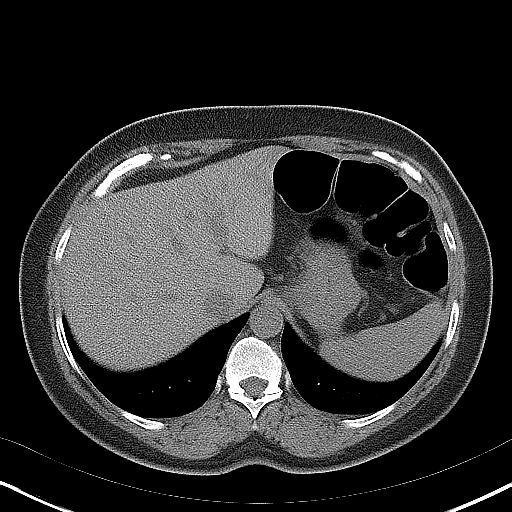
[im 12/23  lung]
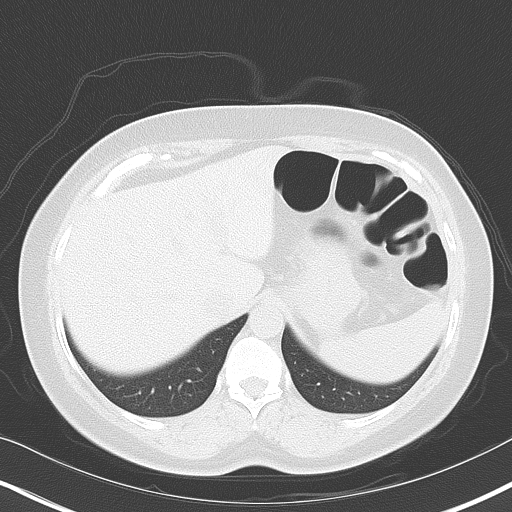
[im 15/23  soft-tissue]
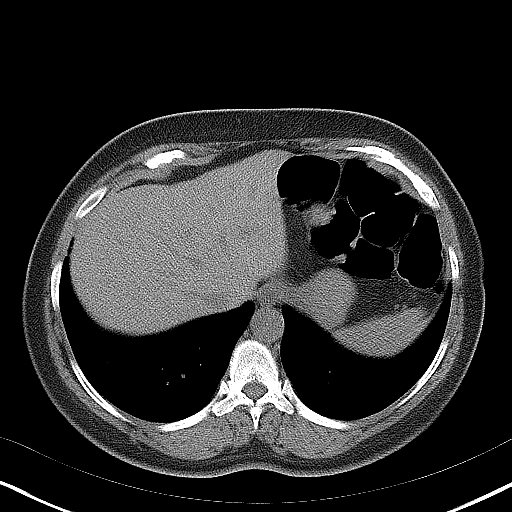
[im 15/23  lung]
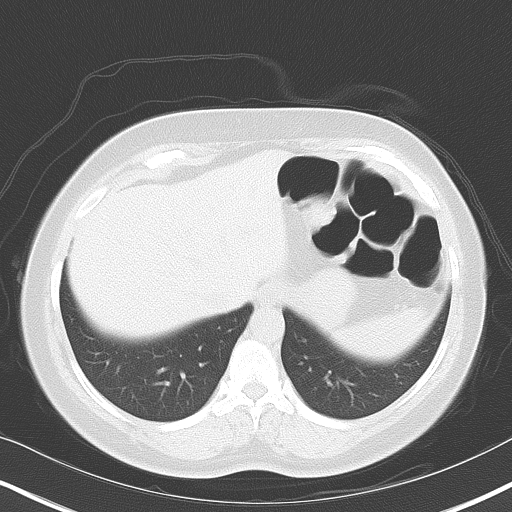
[im 19/23  soft-tissue]
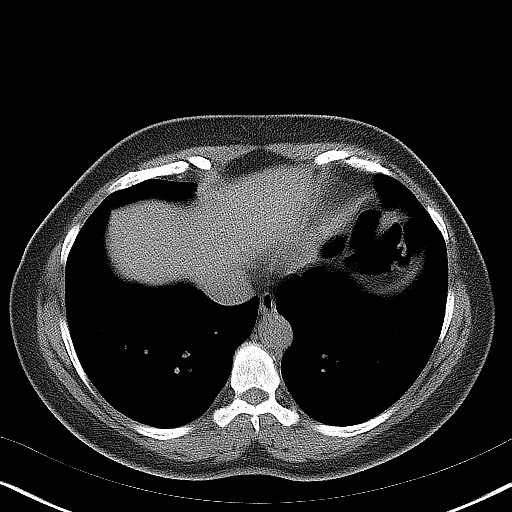
[im 19/23  lung]
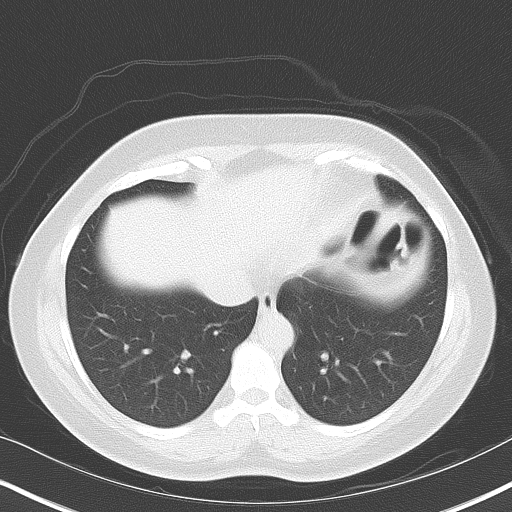

[Series 5: coronal · coronal · 0.70mm/px · 3 of 140 slices shown, 4 images]
[im 47/140  soft-tissue]
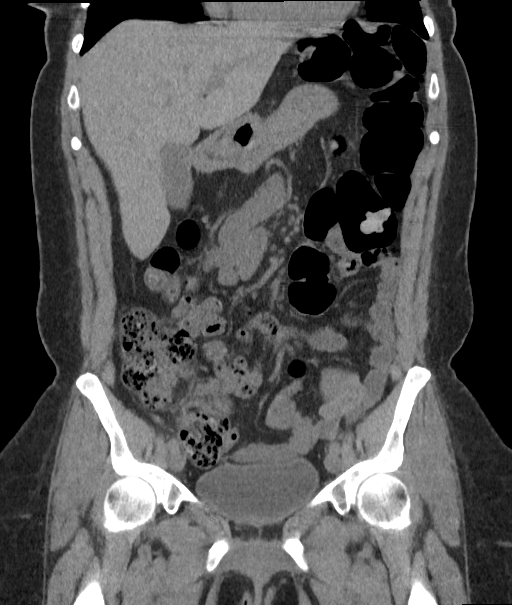
[im 62/140  soft-tissue]
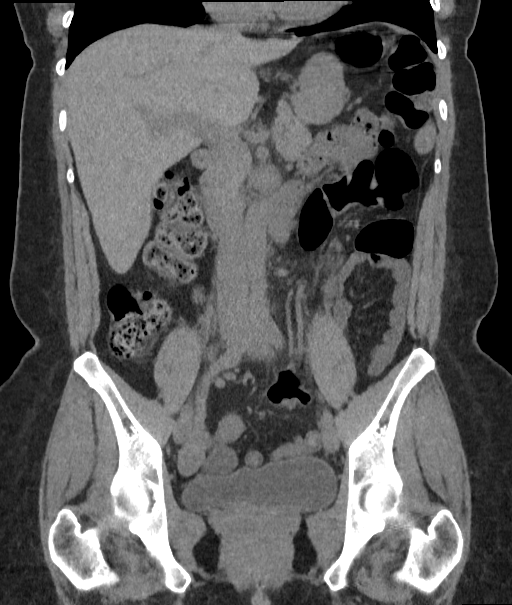
[im 62/140  bone]
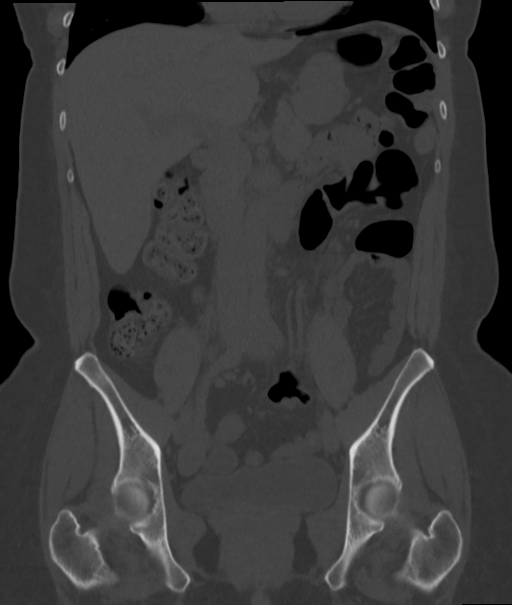
[im 78/140  soft-tissue]
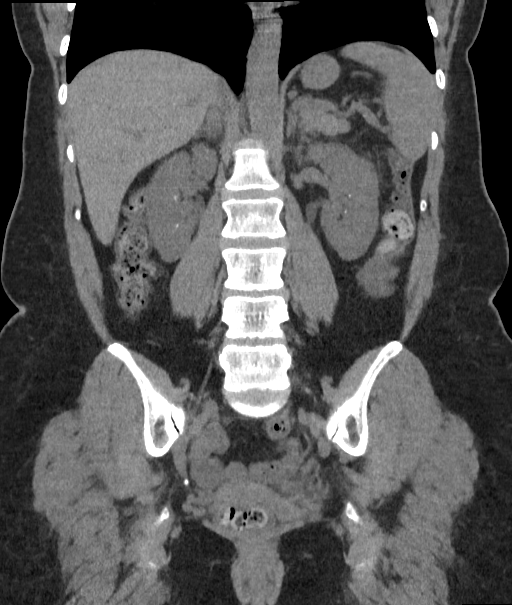

[Series 6: sagittal · sagittal · 0.54mm/px · 1 of 177 slices shown, 2 images]
[im 59/177  soft-tissue]
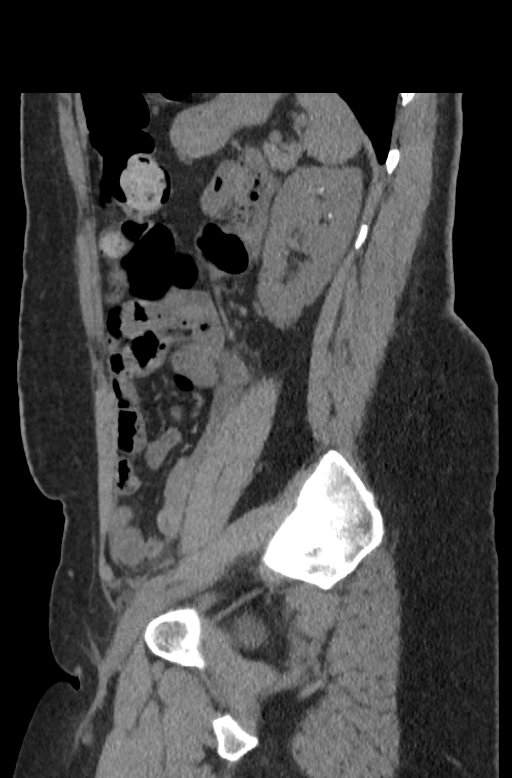
[im 59/177  bone]
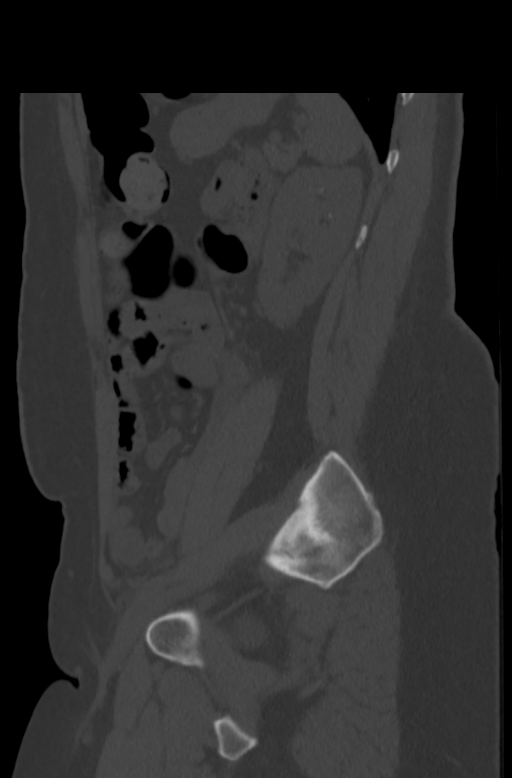

[9 of 46 positions shown; findings below may reference images not displayed]

FINDINGS: Lower chest: No acute abnormality.

Hepatobiliary: No focal liver abnormality is seen. No gallstones,
gallbladder wall thickening, or biliary dilatation.

Pancreas: Unremarkable. No pancreatic ductal dilatation or
surrounding inflammatory changes.

Spleen: Normal in size without focal abnormality.

Adrenals/Urinary Tract: Adrenal glands are well visualized
bilaterally. Bilateral renal calculi are noted stable from the
previous exam. No obstructive changes are seen. The bladder is well
distended.

Stomach/Bowel: The appendix is well visualized and within normal
limits. No obstructive or inflammatory changes of large or small
bowel are seen. The stomach is within normal limits.

Vascular/Lymphatic: No significant vascular findings are present. No
enlarged abdominal or pelvic lymph nodes.

Reproductive: Status post hysterectomy. No adnexal masses.

Other: No abdominal wall hernia or abnormality. No abdominopelvic
ascites.

Musculoskeletal: No acute or significant osseous findings.
IMPRESSION: Bilateral nonobstructing renal calculi. No ureteral stones are seen.

Normal-appearing appendix.

## 2021-03-10 ENCOUNTER — Other Ambulatory Visit: Payer: Self-pay | Admitting: Student

## 2021-03-10 ENCOUNTER — Other Ambulatory Visit (HOSPITAL_COMMUNITY): Payer: Self-pay | Admitting: Student

## 2021-03-10 DIAGNOSIS — R221 Localized swelling, mass and lump, neck: Secondary | ICD-10-CM | POA: Diagnosis not present

## 2021-03-10 DIAGNOSIS — M545 Low back pain, unspecified: Secondary | ICD-10-CM | POA: Diagnosis not present

## 2021-03-10 DIAGNOSIS — Z87442 Personal history of urinary calculi: Secondary | ICD-10-CM | POA: Diagnosis not present

## 2021-03-10 DIAGNOSIS — K581 Irritable bowel syndrome with constipation: Secondary | ICD-10-CM | POA: Diagnosis not present

## 2021-03-17 ENCOUNTER — Other Ambulatory Visit: Payer: Self-pay

## 2021-03-17 ENCOUNTER — Ambulatory Visit
Admission: RE | Admit: 2021-03-17 | Discharge: 2021-03-17 | Disposition: A | Discharge status: Home / Self Care | Payer: Medicaid Other | Source: Ambulatory Visit | Attending: Student | Admitting: Student

## 2021-03-17 DIAGNOSIS — E041 Nontoxic single thyroid nodule: Secondary | ICD-10-CM | POA: Diagnosis not present

## 2021-03-17 DIAGNOSIS — R590 Localized enlarged lymph nodes: Secondary | ICD-10-CM | POA: Diagnosis not present

## 2021-03-17 DIAGNOSIS — R221 Localized swelling, mass and lump, neck: Secondary | ICD-10-CM | POA: Diagnosis not present

## 2021-04-26 DIAGNOSIS — H5213 Myopia, bilateral: Secondary | ICD-10-CM | POA: Diagnosis not present

## 2021-05-28 ENCOUNTER — Ambulatory Visit
Admission: EM | Admit: 2021-05-28 | Discharge: 2021-05-28 | Disposition: A | Discharge status: Home / Self Care | Payer: Medicaid Other | Attending: Family Medicine | Admitting: Family Medicine

## 2021-05-28 ENCOUNTER — Encounter: Payer: Self-pay | Admitting: Emergency Medicine

## 2021-05-28 ENCOUNTER — Other Ambulatory Visit: Payer: Self-pay

## 2021-05-28 DIAGNOSIS — R109 Unspecified abdominal pain: Secondary | ICD-10-CM

## 2021-05-28 DIAGNOSIS — I10 Essential (primary) hypertension: Secondary | ICD-10-CM | POA: Insufficient documentation

## 2021-05-28 LAB — POCT URINALYSIS DIP (MANUAL ENTRY)
Bilirubin, UA: NEGATIVE
Glucose, UA: NEGATIVE mg/dL
Ketones, POC UA: NEGATIVE mg/dL
Leukocytes, UA: NEGATIVE
Nitrite, UA: NEGATIVE
Protein Ur, POC: NEGATIVE mg/dL
Spec Grav, UA: 1.02 (ref 1.010–1.025)
Urobilinogen, UA: 0.2 E.U./dL
pH, UA: 6 (ref 5.0–8.0)

## 2021-05-28 MED ORDER — KETOROLAC TROMETHAMINE 10 MG PO TABS: 10.0000 mg | ORAL_TABLET | Freq: Two times a day (BID) | ORAL | 0 refills | Status: DC | PRN

## 2021-05-28 MED ORDER — LOSARTAN POTASSIUM-HCTZ 50-12.5 MG PO TABS: 1.0000 | ORAL_TABLET | Freq: Every day | ORAL | 0 refills | Status: DC

## 2021-05-28 NOTE — ED Triage Notes (Signed)
Pt presents lower back pain, nausea, and uncomfortable when she urinates x 2-3 days.  ?

## 2021-05-28 NOTE — Discharge Instructions (Signed)
I suspect that your urinary symptoms and right-sided low back pain is possibly related to a kidney stone.  Recommend flushing the kidneys with plenty of water and I have also prescribed you some Toradol which you can take for pain.  If any of your symptoms worsen or you start to urinate a gross amount of blood recommend going immediately to the emergency department for further work-up and evaluation.  In regards to your blood pressure I have restarted her losartan with HCTZ to facilitate lower your blood pressure. ? ?Check your blood pressure at home if your blood pressure readings are consistently greater than 180 your top number and greater than 100 your bottom number and you experience any headaches, weakness or dizziness go immediately to the emergency department.  It may take a few days for your blood pressure to improve with the medication I have prescribed however check your blood pressure daily and keep a log of your readings as I would like for you to follow-up with your primary care doctor in 4 weeks. ?however increased the dose to ?

## 2021-05-28 NOTE — ED Provider Notes (Signed)
?[facility] ? ? ? ?CSN: [id number] ?Arrival date & time: 05/28/21  1024 ? ? ?  ? ?History   ?Chief Complaint ?Chief Complaint  ?Patient presents with  ? Back Pain  ? Nausea  ? ? ?HPI ?Ashley Mcpherson is a 46 y.o. female.  ? ?HPI ?Patient presents today for evaluation of back pain, nausea, and headaches.  Patient reports she ran out of her blood pressure medication a few days ago and subsequently developed headache and has felt lightheaded at times.  She was previously prescribed losartan 25 mg.  She has not had any recent follow-up with her primary care doctor and reports that she would take as needed and not consistently.  She reported she took her blood pressure medicine only when her blood pressure was high if it was well controlled she stopped taking it.  She denies any chest pain. ?Past Medical History:  ?Diagnosis Date  ? Hypertension   ? IBS (irritable bowel syndrome)   ? Kidney stone   ? ? ?Patient Active Problem List  ? Diagnosis Date Noted  ? Acute upper respiratory infection 04/02/2020  ? Shortness of breath 04/02/2020  ? Encounter for school history and physical examination 03/02/2019  ? 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 Local superficial swelling, mass or lump 08/13/2018  ? Constipation 08/13/2018  ? Umbilical hernia without obstruction and without gangrene 08/07/2018  ? Flank pain 06/02/2018  ? Renal calculi 06/02/2018  ? Encounter for general adult medical examination with abnormal findings 11/28/2017  ? Post traumatic stress disorder 11/28/2017  ? Urinary tract infection with hematuria 08/12/2017  ? Dysuria 08/12/2017  ? Chronic bilateral low back pain 08/12/2017  ? Essential hypertension 04/17/2017  ? ? ?Past Surgical History:  ?Procedure Laterality Date  ? CRYOABLATION  2010  ? TUBAL LIGATION  2007  ? VAGINAL HYSTERECTOMY  2013  ? [facility])  ? ? ?OB History   ?No obstetric history on file. ?  ? ? ? ?Home Medications   ? ?Prior to Admission medications   ?Medication Sig  Start Date End Date Taking? Authorizing Provider  ?albuterol (VENTOLIN HFA) 108 (90 Base) MCG/ACT inhaler Inhale 2 puffs into the lungs every 6 (six) hours as needed for wheezing or shortness of breath. 03/15/20   Carlean Jews, NP  ?azithromycin (ZITHROMAX) 250 MG tablet z-pack - take as directed for 5 days 03/15/20   Carlean Jews, NP  ?escitalopram (LEXAPRO) 5 MG tablet Take 1 tablet (5 mg total) by mouth daily. 07/01/20   McDonough, Salomon Fick, PA-C  ?linaclotide (LINZESS) 290 MCG CAPS capsule Take 290 mcg by mouth daily before breakfast.    [provider]  ?losartan (COZAAR) 25 MG tablet Take 1 tablet (25 mg total) by mouth daily. 04/16/20   McDonough, Salomon Fick, PA-C  ?meclizine (ANTIVERT) 25 MG tablet Take 1 tablet (25 mg total) by mouth 3 (three) times daily as needed for dizziness. 05/03/20   Ivette Loyal, NP  ?methylPREDNISolone (MEDROL) 4 MG TBPK tablet Take by mouth as directed for 6 days 03/15/20   Carlean Jews, NP  ?omeprazole (PRILOSEC) 40 MG capsule Take 40 mg by mouth daily.    [provider]  ?polyethylene glycol powder (GLYCOLAX/MIRALAX) 17 GM/SCOOP powder Take 17 g by mouth daily as needed for moderate constipation. 07/29/18   Minna Antis, MD  ?zolpidem (AMBIEN) 5 MG tablet Take 1 tablet (5 mg total) by mouth at bedtime as needed for sleep. 07/01/20  McDonough, Salomon Fick, PA-C  ? ? ?Family History ?Family History  ?Problem Relation Age of Onset  ? Diabetes Mother   ? Hypertension Mother   ? Diabetes Father   ? Hypertension Father   ? Bladder Cancer Neg Hx   ? Kidney cancer Neg Hx   ? ? ?Social History ?Social History  ? ?Tobacco Use  ? Smoking status: Never  ? Smokeless tobacco: Never  ?Vaping Use  ? Vaping Use: Never used  ?Substance Use Topics  ? Alcohol use: No  ? Drug use: No  ? ? ? ?Allergies   ?Sulfa antibiotics ? ? ?Review of Systems ?Review of Systems ? ? ?Physical Exam ?Triage Vital Signs ?ED Triage Vitals  ?Enc Vitals Group  ?   BP 05/28/21 1115 (!) 181/114   ?   Pulse Rate 05/28/21 1115 82  ?   Resp 05/28/21 1115 18  ?   Temp 05/28/21 1115 98.9 ?F (37.2 ?C)  ?   Temp Source 05/28/21 1115 Oral  ?   SpO2 05/28/21 1115 99 %  ?   Weight --   ?   Height --   ?   Head Circumference --   ?   Peak Flow --   ?   Pain Score 05/28/21 1114 5  ?   Pain Loc --   ?   Pain Edu? --   ?   Excl. in GC? --   ? ?No data found. ? ?Updated Vital Signs ?BP (!) 181/114 (BP Location: Right Arm)   Pulse 82   Temp 98.9 ?F (37.2 ?C) (Oral)   Resp 18   SpO2 99%  ? ?Visual Acuity ?Right Eye Distance:   ?Left Eye Distance:   ?Bilateral Distance:   ? ?Right Eye Near:   ?Left Eye Near:    ?Bilateral Near:    ? ?Physical Exam ?Constitutional:   ?   Appearance: Normal appearance.  ?HENT:  ?   Head: Normocephalic and atraumatic.  ?Eyes:  ?   Extraocular Movements: Extraocular movements intact.  ?   Pupils: Pupils are equal, round, and reactive to light.  ?Cardiovascular:  ?   Rate and Rhythm: Normal rate and regular rhythm.  ?Pulmonary:  ?   Effort: Pulmonary effort is normal.  ?   Breath sounds: Normal breath sounds.  ?Abdominal:  ?   General: Bowel sounds are normal.  ?   Tenderness: There is right CVA tenderness. There is no left CVA tenderness.  ?Skin: ?   General: Skin is warm and dry.  ?   Capillary Refill: Capillary refill takes less than 2 seconds.  ?Neurological:  ?   General: No focal deficit present.  ?   Mental Status: She is alert and oriented to person, place, and time.  ?   Sensory: No sensory deficit.  ?   Motor: No weakness.  ?   Gait: Gait normal.  ?Psychiatric:     ?   Mood and Affect: Mood normal.     ?   Behavior: Behavior normal.     ?   Thought Content: Thought content normal.     ?   Judgment: Judgment normal.  ? ? ? ?UC Treatments / Results  ?Labs ?(all labs ordered are listed, but only abnormal results are displayed) ?Labs Reviewed  ?POCT URINALYSIS DIP (MANUAL ENTRY) - Abnormal; Notable for the following components:  ?    Result Value  ? Blood, UA small (*)   ? All other  components within  normal limits  ? ? ?EKG ? ? ?Radiology ?No results found. ? ?Procedures ?Procedures (including critical care time) ? ?Medications Ordered in UC ?Medications - No data to display ? ?Initial Impression / Assessment and Plan / UC Course  ?I have reviewed the triage vital signs and the nursing notes. ? ?Pertinent labs & imaging results that were available during my care of the patient were reviewed by me and considered in my medical decision making (see chart for details). ? ?  ?Acute Right Flank Pain  ?Suspect this is related to your chronic renal stones.  Toradol prescribed for pain.  Encouraged to hydrate well with fluids.  If pain worsens or you experience gross hematuria go immediately to the emergency department as she may require evaluation by a urologist. ? ?Accelerated hypertension ?Discontinue Losartan 25, increase losartan to 50 mg with HCTZ 12.5 mg.  Patient given strict ER precautions if she develops any worsening of neurological symptoms such as severe headache, weakness, or  dizziness. Check blood pressure at home per discharge instructions if consistently greater than 180/100 go immediately to the emergency department.  Schedule follow-up with your primary care provider within the next 4 weeks for blood pressure management. ?Final Clinical Impressions(s) / UC Diagnoses  ? ?Final diagnoses:  ?Acute right flank pain  ?Accelerated hypertension  ? ? ? ?Discharge Instructions   ? ?  ?I suspect that your urinary symptoms and right-sided low back pain is possibly related to a kidney stone.  Recommend flushing the kidneys with plenty of water and I have also prescribed you some Toradol which you can take for pain.  If any of your symptoms worsen or you start to urinate a gross amount of blood recommend going immediately to the emergency department for further work-up and evaluation.  In regards to your blood pressure I have restarted her losartan with HCTZ to facilitate lower your blood  pressure. ? ?Check your blood pressure at home if your blood pressure readings are consistently greater than 180 your top number and greater than 100 your bottom number and you experience any headaches, weakness or

## 2021-05-30 LAB — URINE CULTURE
Culture: <10000 — AB
Special Requests: NORMAL

## 2021-07-03 ENCOUNTER — Other Ambulatory Visit: Payer: Self-pay

## 2021-07-03 ENCOUNTER — Emergency Department
Admission: EM | Admit: 2021-07-03 | Discharge: 2021-07-03 | Disposition: A | Discharge status: Home / Self Care | Payer: Medicaid Other | Attending: Emergency Medicine | Admitting: Emergency Medicine

## 2021-07-03 DIAGNOSIS — R1013 Epigastric pain: Secondary | ICD-10-CM | POA: Diagnosis not present

## 2021-07-03 DIAGNOSIS — D638 Anemia in other chronic diseases classified elsewhere: Secondary | ICD-10-CM | POA: Insufficient documentation

## 2021-07-03 DIAGNOSIS — K2900 Acute gastritis without bleeding: Secondary | ICD-10-CM

## 2021-07-03 DIAGNOSIS — I10 Essential (primary) hypertension: Secondary | ICD-10-CM | POA: Insufficient documentation

## 2021-07-03 DIAGNOSIS — R1010 Upper abdominal pain, unspecified: Secondary | ICD-10-CM | POA: Diagnosis present

## 2021-07-03 DIAGNOSIS — E876 Hypokalemia: Secondary | ICD-10-CM | POA: Diagnosis not present

## 2021-07-03 LAB — URINALYSIS, ROUTINE W REFLEX MICROSCOPIC
Bilirubin Urine: NEGATIVE
Glucose, UA: NEGATIVE mg/dL
Hgb urine dipstick: NEGATIVE
Ketones, ur: NEGATIVE mg/dL
Leukocytes,Ua: NEGATIVE
Nitrite: NEGATIVE
Protein, ur: NEGATIVE mg/dL
Specific Gravity, Urine: 1.019 (ref 1.005–1.030)
pH: 6 (ref 5.0–8.0)

## 2021-07-03 LAB — LIPASE, BLOOD: Lipase: 32 U/L (ref 11–51)

## 2021-07-03 LAB — COMPREHENSIVE METABOLIC PANEL
ALT: 13 U/L (ref 0–44)
AST: 17 U/L (ref 15–41)
Albumin: 3.8 g/dL (ref 3.5–5.0)
Alkaline Phosphatase: 47 U/L (ref 38–126)
Anion gap: 6 (ref 5–15)
BUN: 15 mg/dL (ref 6–20)
CO2: 30 mmol/L (ref 22–32)
Calcium: 8.9 mg/dL (ref 8.9–10.3)
Chloride: 102 mmol/L (ref 98–111)
Creatinine, Ser: 0.96 mg/dL (ref 0.44–1.00)
GFR, Estimated: >60 mL/min (ref >60)
Glucose, Bld: 104 mg/dL — ABNORMAL HIGH (ref 70–99)
Potassium: 3.2 mmol/L — ABNORMAL LOW (ref 3.5–5.1)
Sodium: 138 mmol/L (ref 135–145)
Total Bilirubin: 0.6 mg/dL (ref 0.3–1.2)
Total Protein: 7.6 g/dL (ref 6.5–8.1)

## 2021-07-03 LAB — CBC
HCT: 33.4 % — ABNORMAL LOW (ref 36.0–46.0)
Hemoglobin: 10.1 g/dL — ABNORMAL LOW (ref 12.0–15.0)
MCH: 23 pg — ABNORMAL LOW (ref 26.0–34.0)
MCHC: 30.2 g/dL (ref 30.0–36.0)
MCV: 76.1 fL — ABNORMAL LOW (ref 80.0–100.0)
Platelets: 291 10*3/uL (ref 150–400)
RBC: 4.39 MIL/uL (ref 3.87–5.11)
RDW: 14.6 % (ref 11.5–15.5)
WBC: 6.6 10*3/uL (ref 4.0–10.5)
nRBC: 0 % (ref 0.0–0.2)

## 2021-07-03 MED ORDER — POTASSIUM CHLORIDE CRYS ER 20 MEQ PO TBCR
40.0000 meq | EXTENDED_RELEASE_TABLET | Freq: Once | ORAL | Status: AC
  Administered 2021-07-03: 40 meq via ORAL
  Filled 2021-07-03: qty 2

## 2021-07-03 MED ORDER — ONDANSETRON 4 MG PO TBDP
4.0000 mg | ORAL_TABLET | Freq: Once | ORAL | Status: AC
  Administered 2021-07-03: 4 mg via ORAL
  Filled 2021-07-03: qty 1

## 2021-07-03 NOTE — ED Notes (Signed)
Pt discharge information reviewed. Pt understands need for follow up care and when to return if symptoms worsen. All questions answered. Pt is alert and oriented with even and regular respirations. Pt is seen ambulating out of department with string steady gait.   

## 2021-07-03 NOTE — ED Provider Notes (Signed)
? ?[facility] ?Provider Note ? ? ? Event Date/Time  ? First MD Initiated Contact with Patient 07/03/21 1726   ?  (approximate) ? ? ?History  ? ?Chief Complaint ?Abdominal Pain ? ? ?HPI ? ?Ashley Mcpherson is a 46 y.o. female with past medical history of hypertension, IBS, and kidney stones who presents to the ED complaining of abdominal pain.  Patient reports that she has been dealing with intermittent discomfort in her upper abdomen which she describes as a dull ache.  It is never particularly severe and she describes it as more of a discomfort rather than overt pain.  It has been associated with nausea and primarily comes on when she eats.  She has not had any vomiting and denies any changes in her bowel movements, denies any blood in her stool.  She does endorse some dysuria, but denies any hematuria, fever, flank pain, vaginal bleeding or discharge.  She has never had similar symptoms in the past, denies any prior abdominal surgeries outside of a hysterectomy.  She denies any pain in her abdomen currently. ?  ? ? ?Physical Exam  ? ?Triage Vital Signs: ?ED Triage Vitals  ?Enc Vitals Group  ?   BP 07/03/21 1725 (!) 152/87  ?   Pulse Rate 07/03/21 1725 88  ?   Resp 07/03/21 1725 18  ?   Temp 07/03/21 1725 98.2 ?F (36.8 ?C)  ?   Temp Source 07/03/21 1725 Oral  ?   SpO2 07/03/21 1725 100 %  ?   Weight --   ?   Height --   ?   Head Circumference --   ?   Peak Flow --   ?   Pain Score 07/03/21 1722 2  ?   Pain Loc --   ?   Pain Edu? --   ?   Excl. in GC? --   ? ? ?Most recent vital signs: ?Vitals:  ? 07/03/21 1730 07/03/21 2007  ?BP: (!) 135/91 138/84  ?Pulse: 87   ?Resp:    ?Temp:    ?SpO2: 99%   ? ? ?Constitutional: Alert and oriented. ?Eyes: Conjunctivae are normal. ?Head: Atraumatic. ?Nose: No congestion/rhinnorhea. ?Mouth/Throat: Mucous membranes are moist.  ?Cardiovascular: Normal rate, regular rhythm. Grossly normal heart sounds.  2+ radial pulses bilaterally. ?Respiratory: Normal  respiratory effort.  No retractions. Lungs CTAB. ?Gastrointestinal: Soft and nontender. No distention. ?Musculoskeletal: No lower extremity tenderness nor edema.  ?Neurologic:  Normal speech and language. No gross focal neurologic deficits are appreciated. ? ? ? ?ED Results / Procedures / Treatments  ? ?Labs ?(all labs ordered are listed, but only abnormal results are displayed) ?Labs Reviewed  ?COMPREHENSIVE METABOLIC PANEL - Abnormal; Notable for the following components:  ?    Result Value  ? Potassium 3.2 (*)   ? Glucose, Bld 104 (*)   ? All other components within normal limits  ?CBC - Abnormal; Notable for the following components:  ? Hemoglobin 10.1 (*)   ? HCT 33.4 (*)   ? MCV 76.1 (*)   ? MCH 23.0 (*)   ? All other components within normal limits  ?URINALYSIS, ROUTINE W REFLEX MICROSCOPIC - Abnormal; Notable for the following components:  ? Color, Urine YELLOW (*)   ? APPearance HAZY (*)   ? All other components within normal limits  ?LIPASE, BLOOD  ? ? ?PROCEDURES: ? ?Critical Care performed: No ? ?Procedures ? ? ?MEDICATIONS ORDERED IN ED: ?Medications  ?ondansetron (ZOFRAN-ODT) disintegrating tablet 4 mg (  4 mg Oral Given 07/03/21 1926)  ?potassium chloride SA (KLOR-CON M) CR tablet 40 mEq (40 mEq Oral Given 07/03/21 1927)  ? ? ? ?IMPRESSION / MDM / ASSESSMENT AND PLAN / ED COURSE  ?I reviewed the triage vital signs and the nursing notes. ?             ?               ? ?46 y.o. female with past medical history of hypertension, kidney stones, and IBS who presents to the ED complaining of intermittent epigastric discomfort and nausea associated with eating for the past couple of days. ? ?Differential diagnosis includes, but is not limited to, gastritis, GERD, pancreatitis, hepatitis, biliary colic, cholecystitis, and UTI. ? ?Patient is nontoxic-appearing and in no acute distress, vital signs are unremarkable and she has a benign abdominal exam.  Symptoms sound most consistent with gastritis, low suspicion  for biliary colic given minimal pain with these episodes.  We will screen labs including CBC, CMP, and lipase.  Urinalysis is also pending.  Given reassuring abdominal exam, do not feel ultrasound or CT imaging is indicated at this time.  We will treat symptomatically with ODT Zofran and reassess. ? ?Labs are reassuring, BMP remarkable only for mild hypokalemia, LFTs and lipase are within normal limits, CBC with stable chronic anemia and no leukocytosis.  Patient reports feeling better following ODT Zofran, was given supplemental potassium as well.  Symptoms are consistent with gastritis and she is appropriate for discharge home with PCP follow-up.  She states she has been taking her omeprazole as needed and she was counseled to take this on a daily basis regardless of symptoms.  She was counseled to return to the ED for new worsening symptoms, patient agrees with plan. ? ?  ? ? ?FINAL CLINICAL IMPRESSION(S) / ED DIAGNOSES  ? ?Final diagnoses:  ?Epigastric pain  ?Acute gastritis without hemorrhage, unspecified gastritis type  ? ? ? ?Rx / DC Orders  ? ?ED Discharge Orders   ? ? None  ? ?  ? ? ? ?Note:  This document was prepared using Dragon voice recognition software and may include unintentional dictation errors. ?  ?Chesley Noon, MD ?07/03/21 2017 ? ?

## 2021-07-03 NOTE — ED Notes (Signed)
Pt refusing IV, states she has had a bad experience with them in the past, but did allow this RN to straight stick her for blood work ?

## 2021-07-03 NOTE — ED Triage Notes (Signed)
Pt come with c/o belly pain soreness, nausea and unsure if she has hernia. Pt states has continued to get nauseated. ?

## 2021-07-28 ENCOUNTER — Encounter: Payer: Self-pay | Admitting: Emergency Medicine

## 2021-07-28 ENCOUNTER — Ambulatory Visit
Admission: EM | Admit: 2021-07-28 | Discharge: 2021-07-28 | Disposition: A | Discharge status: Home / Self Care | Payer: Medicaid Other | Attending: Emergency Medicine | Admitting: Emergency Medicine

## 2021-07-28 DIAGNOSIS — T148XXA Other injury of unspecified body region, initial encounter: Secondary | ICD-10-CM | POA: Diagnosis not present

## 2021-07-28 DIAGNOSIS — M79605 Pain in left leg: Secondary | ICD-10-CM | POA: Diagnosis not present

## 2021-07-28 MED ORDER — MELOXICAM 7.5 MG PO TABS: 7.5000 mg | ORAL_TABLET | Freq: Every day | ORAL | 0 refills | Status: DC

## 2021-07-28 NOTE — Discharge Instructions (Addendum)
Cause of your symptoms is unknown at this time however I do not believe that symptoms today are emergent and will cause any complications or harm  It is possible that your symptoms are a varicose vein that has ruptured, I have low suspicions for blood clot or Baker's cyst   We have obtained labs today to check your blood levels, you will be notified of any concerning values  Take meloxicam every morning with food for the next 5 days then as needed, this medicine is to reduce any inflammation that may be compressing the nerve in your leg which is causing the numbness to your foot  You may apply heat over the affected area or ice in 10 to 15-minute intervals for general comfort  You may elevate your leg on pillows while sitting or lying any swelling  Please watch your bruising closely if it worsens or you begin to have new bruising please go to the nearest emergency department for further evaluation  Please schedule an appointment with your doctor for follow-up and reevaluation for next week

## 2021-07-28 NOTE — ED Triage Notes (Signed)
Pt presents with left leg pain near the back of her knee started today. The patient also has some bruising in the area.

## 2021-07-29 ENCOUNTER — Other Ambulatory Visit: Payer: Self-pay

## 2021-07-29 ENCOUNTER — Emergency Department (HOSPITAL_COMMUNITY)
Admission: EM | Admit: 2021-07-29 | Discharge: 2021-07-29 | Discharge status: Against Medical Advice | Payer: Medicaid Other | Attending: Emergency Medicine | Admitting: Emergency Medicine

## 2021-07-29 ENCOUNTER — Encounter (HOSPITAL_COMMUNITY): Payer: Self-pay | Admitting: Emergency Medicine

## 2021-07-29 ENCOUNTER — Emergency Department (HOSPITAL_BASED_OUTPATIENT_CLINIC_OR_DEPARTMENT_OTHER): Payer: Medicaid Other

## 2021-07-29 DIAGNOSIS — M79652 Pain in left thigh: Secondary | ICD-10-CM | POA: Diagnosis not present

## 2021-07-29 DIAGNOSIS — M25562 Pain in left knee: Secondary | ICD-10-CM | POA: Insufficient documentation

## 2021-07-29 DIAGNOSIS — Z5321 Procedure and treatment not carried out due to patient leaving prior to being seen by health care provider: Secondary | ICD-10-CM | POA: Insufficient documentation

## 2021-07-29 DIAGNOSIS — M79605 Pain in left leg: Secondary | ICD-10-CM

## 2021-07-29 LAB — CBC WITH DIFFERENTIAL/PLATELET
Abs Immature Granulocytes: 0.02 10*3/uL (ref 0.00–0.07)
Basophils Absolute: 0 10*3/uL (ref 0.0–0.2)
Basophils Absolute: 0 10*3/uL (ref 0.0–0.1)
Basophils Relative: 0 %
Basos: 1 %
EOS (ABSOLUTE): 0.1 10*3/uL (ref 0.0–0.4)
Eos: 1 %
Eosinophils Absolute: 0.1 10*3/uL (ref 0.0–0.5)
Eosinophils Relative: 1 %
HCT: 34.2 % — ABNORMAL LOW (ref 36.0–46.0)
Hematocrit: 32.9 % — ABNORMAL LOW (ref 34.0–46.6)
Hemoglobin: 10.5 g/dL — ABNORMAL LOW (ref 11.1–15.9)
Hemoglobin: 10.5 g/dL — ABNORMAL LOW (ref 12.0–15.0)
Immature Grans (Abs): 0 10*3/uL (ref 0.0–0.1)
Immature Granulocytes: 0 %
Immature Granulocytes: 0 %
Lymphocytes Absolute: 2.6 10*3/uL (ref 0.7–3.1)
Lymphocytes Relative: 38 %
Lymphs Abs: 2.7 10*3/uL (ref 0.7–4.0)
Lymphs: 35 %
MCH: 23.1 pg — ABNORMAL LOW (ref 26.6–33.0)
MCH: 23.6 pg — ABNORMAL LOW (ref 26.0–34.0)
MCHC: 31.9 g/dL (ref 31.5–35.7)
MCHC: 30.7 g/dL (ref 30.0–36.0)
MCV: 72 fL — ABNORMAL LOW (ref 79–97)
MCV: 77 fL — ABNORMAL LOW (ref 80.0–100.0)
Monocytes Absolute: 0.3 10*3/uL (ref 0.1–0.9)
Monocytes Absolute: 0.4 10*3/uL (ref 0.1–1.0)
Monocytes Relative: 6 %
Monocytes: 5 %
Neutro Abs: 3.9 10*3/uL (ref 1.7–7.7)
Neutrophils Absolute: 4.4 10*3/uL (ref 1.4–7.0)
Neutrophils Relative %: 55 %
Neutrophils: 58 %
Platelets: 312 10*3/uL (ref 150–450)
Platelets: 293 10*3/uL (ref 150–400)
RBC: 4.55 x10E6/uL (ref 3.77–5.28)
RBC: 4.44 MIL/uL (ref 3.87–5.11)
RDW: 14.9 % (ref 11.7–15.4)
RDW: 15 % (ref 11.5–15.5)
WBC: 7.5 10*3/uL (ref 3.4–10.8)
WBC: 7.1 10*3/uL (ref 4.0–10.5)
nRBC: 0 % (ref 0.0–0.2)

## 2021-07-29 LAB — BASIC METABOLIC PANEL
Anion gap: 5 (ref 5–15)
BUN: 11 mg/dL (ref 6–20)
CO2: 28 mmol/L (ref 22–32)
Calcium: 8.7 mg/dL — ABNORMAL LOW (ref 8.9–10.3)
Chloride: 104 mmol/L (ref 98–111)
Creatinine, Ser: 0.85 mg/dL (ref 0.44–1.00)
GFR, Estimated: >60 mL/min (ref >60)
Glucose, Bld: 97 mg/dL (ref 70–99)
Potassium: 3.3 mmol/L — ABNORMAL LOW (ref 3.5–5.1)
Sodium: 137 mmol/L (ref 135–145)

## 2021-07-29 NOTE — Progress Notes (Signed)
Left lower extremity venous duplex has been completed. Preliminary results can be found in CV Proc through chart review.  Results were given to [facility] PA.  07/29/21 7:33 PM Ashley Mcpherson RVT

## 2021-07-29 NOTE — ED Triage Notes (Signed)
Patient coming from home, complaint of left leg pain behind the knee. Endorses knot and bruising to the back of the left knee.

## 2021-07-29 NOTE — ED Provider Triage Note (Signed)
Emergency Medicine Provider Triage Evaluation Note  Ashley Mcpherson , a 46 y.o. female  was evaluated in triage.  Pt complains of left leg pain onset 1 week. Notes that she has pain to the posterior aspect of the left knee and left thigh.  Bruising noted to the area.  No recent falls or injury. Denies HRT, OCP, anticoagulant use, surgery, history of malignancy.  Review of Systems  Positive: As per HPI above Negative:   Physical Exam  BP (!) 141/92 (BP Location: Right Arm)   Pulse 77   Temp 98.3 F (36.8 C) (Oral)   Resp 17   SpO2 100%  Gen:   Awake, no distress  Resp:  Normal effort  MSK:   Moves extremities without difficulty.  Bruising noted to the posterior lower aspect of the left thigh with mild tenderness to palpation noted to the area.  No appreciable lower leg swelling. Other:  Able to ambulate without assistance or difficulty  Medical Decision Making  Medically screening exam initiated at 6:38 PM.  Appropriate orders placed.  Ashley Mcpherson was informed that the remainder of the evaluation will be completed by another provider, this initial triage assessment does not replace that evaluation, and the importance of remaining in the ED until their evaluation is complete.   Work-up initiated   Ashley Mcpherson A, PA-C 07/29/21 1935

## 2021-07-29 NOTE — ED Notes (Signed)
LWBS 

## 2021-07-29 NOTE — Progress Notes (Signed)
Pt notified of positive labs and recommendations from [place]. Pt too take OTC iron pill and vitamin C 500mg  and follow up with her PCP. All questions answered.

## 2021-08-01 NOTE — ED Provider Notes (Signed)
[facility]    CSN: [id number] Arrival date & time: 07/28/21  1645      History   Chief Complaint Chief Complaint  Patient presents with   Leg Pain    HPI Ashley Mcpherson is a 46 y.o. female.   Patient presents with posterior left leg pain for 7 days.  Endorses that she initially felt a knot near the back of her knee that began 7 days ago.  Symptoms started abruptly without precipitating event, injury or trauma.  Within the last 3 to 4 days patient began to feel fluid within her leg as if the knot had burst and then began to have a soreness throughout the upper and lower extremity.  Noticed bruising to the posterior thigh 1 day ago.  Able to bear weight.  Range of motion of the leg intact.  Denies swelling or warmth to the skin.  Has not attempted treatment of symptoms.  History of hypertension, IBS.   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/2019   Local superficial swelling, mass or lump 08/13/2018   Constipation [id number]   Umbilical hernia without obstruction and without gangrene 08/07/2018   Flank pain 06/02/2018   Renal calculi 06/02/2018   Encounter for general adult medical examination with abnormal findings 11/28/2017   Post traumatic stress disorder 11/28/2017   Urinary tract infection with hematuria 08/12/2017   Dysuria 08/12/2017   Chronic bilateral low back pain 08/12/2017   Essential hypertension 04/17/2017    Past Surgical History:  Procedure Laterality Date   CRYOABLATION  2010   TUBAL LIGATION  2007   VAGINAL HYSTERECTOMY  2013   Dr[facility])    OB History   No obstetric history on file.      Home Medications    Prior to  Admission medications   Medication Sig Start Date End Date Taking? Authorizing Provider  meloxicam (MOBIC) 7.5 MG tablet Take 1 tablet (7.5 mg total) by mouth daily. 07/28/21  Yes Chemeka Filice R, NP  albuterol (VENTOLIN HFA) 108 (90 Base) MCG/ACT inhaler Inhale 2 puffs into the lungs every 6 (six) hours as needed for wheezing or shortness of breath. 03/15/20   Ronnell Freshwater, NP  azithromycin (ZITHROMAX) 250 MG tablet z-pack - take as directed for 5 days 03/15/20   Ronnell Freshwater, NP  escitalopram (LEXAPRO) 5 MG tablet Take 1 tablet (5 mg total) by mouth daily. 07/01/20   McDonough, Si Gaul, PA-C  ketorolac (TORADOL) 10 MG tablet Take 1 tablet (10 mg total) by mouth 2 (two) times daily as needed for moderate pain or severe pain (back pain). 05/28/21   Scot Jun, FNP  linaclotide (LINZESS) 290 MCG CAPS capsule Take 290 mcg by mouth daily before breakfast.    [provider]  losartan-hydrochlorothiazide (HYZAAR) 50-12.5 MG tablet Take 1 tablet by mouth daily. 05/28/21   Scot Jun, FNP  meclizine (ANTIVERT) 25 MG tablet Take 1 tablet (25 mg total) by mouth 3 (three) times daily as needed for dizziness. 05/03/20   Pearson Forster, NP  methylPREDNISolone (MEDROL) 4 MG TBPK tablet Take by mouth as directed for 6 days 03/15/20  Ronnell Freshwater, NP  omeprazole (PRILOSEC) 40 MG capsule Take 40 mg by mouth daily.    [provider]  polyethylene glycol powder (GLYCOLAX/MIRALAX) 17 GM/SCOOP powder Take 17 g by mouth daily as needed for moderate constipation. 07/29/18   Harvest Dark, MD  zolpidem (AMBIEN) 5 MG tablet Take 1 tablet (5 mg total) by mouth at bedtime as needed for sleep. 07/01/20   McDonough, Si Gaul, PA-C    Family History Family History  Problem Relation Age of Onset   Diabetes Mother    Hypertension Mother    Diabetes Father    Hypertension Father    Bladder Cancer Neg Hx    Kidney cancer Neg Hx     Social History Social History   Tobacco  Use   Smoking status: Never   Smokeless tobacco: Never  Vaping Use   Vaping Use: Never used  Substance Use Topics   Alcohol use: No   Drug use: No     Allergies   Sulfa antibiotics   Review of Systems Review of Systems  Constitutional: Negative.   Respiratory: Negative.    Cardiovascular: Negative.   Skin: Negative.     Physical Exam Triage Vital Signs ED Triage Vitals [07/28/21 1730]  Enc Vitals Group     BP 134/90     Pulse Rate 78     Resp 18     Temp 98.3 F (36.8 C)     Temp Source Oral     SpO2 100 %     Weight      Height      Head Circumference      Peak Flow      Pain Score 4     Pain Loc      Pain Edu?      Excl. in [place]?    No data found.  Updated Vital Signs BP 134/90 (BP Location: Left Arm)   Pulse 78   Temp 98.3 F (36.8 C) (Oral)   Resp 18   SpO2 100%   Visual Acuity Right Eye Distance:   Left Eye Distance:   Bilateral Distance:    Right Eye Near:   Left Eye Near:    Bilateral Near:     Physical Exam Constitutional:      Appearance: Normal appearance.  HENT:     Head: Normocephalic.  Eyes:     Extraocular Movements: Extraocular movements intact.  Pulmonary:     Effort: Pulmonary effort is normal.  Musculoskeletal:     Comments: 1 x 2 cm bruise to the lateral aspect of the posterior left thigh, tenderness noted throughout the posterior left thigh and lower extremity without point tenderness noted, varicose veins are present to the posterior left thigh, no tenderness within the posterior of the knee, 2+ popliteal pulse, no swelling noted throughout the lower extremity, able to bear weight, range of motion intact  Neurological:     Mental Status: She is alert and oriented to person, place, and time. Mental status is at baseline.  Psychiatric:        Mood and Affect: Mood normal.        Behavior: Behavior normal.     UC Treatments / Results  Labs (all labs ordered are listed, but only abnormal results are displayed) Labs  Reviewed  CBC WITH DIFFERENTIAL/PLATELET - Abnormal; Notable for the following components:      Result Value   Hemoglobin 10.5 (*)    Hematocrit 32.9 (*)    MCV  72 (*)    MCH 23.1 (*)    All other components within normal limits   Narrative:    Performed at:  [address], [place], [place]  [id number] Lab Director: Rush Farmer MD, Phone:  [phone]    EKG   Radiology No results found.  Procedures Procedures (including critical care time)  Medications Ordered in UC Medications - No data to display  Initial Impression / Assessment and Plan / UC Course  I have reviewed the triage vital signs and the nursing notes.  Pertinent labs & imaging results that were available during my care of the patient were reviewed by me and considered in my medical decision making (see chart for details).  Bruise Left leg pain  Unknown etiology of symptoms, unable to obtain imaging due to lack of availability, discussed with patient, low suspicion for DVT due to to presentation, possibly related to rupture of a varicose vein, discussed with patient, recommended watchful waiting and supportive care with follow-up with PCP in 1 week, CBC pending, meloxicam 7.5 mg daily for 5 days then as needed prescribed for discomfort, recommend ice and heat over the affected area in 10 to 15 minutes, elevation and rest, given strict precautions for any worsening symptoms to go to the nearest emergency department for imaging  Final Clinical Impressions(s) / UC Diagnoses   Final diagnoses:  Bruise     Discharge Instructions      Cause of your symptoms is unknown at this time however I do not believe that symptoms today are emergent and will cause any complications or harm  It is possible that your symptoms are a varicose vein that has ruptured, I have low suspicions for blood clot or Baker's cyst   We have obtained labs today to check your blood levels, you will be notified of  any concerning values  Take meloxicam every morning with food for the next 5 days then as needed, this medicine is to reduce any inflammation that may be compressing the nerve in your leg which is causing the numbness to your foot  You may apply heat over the affected area or ice in 10 to 15-minute intervals for general comfort  You may elevate your leg on pillows while sitting or lying any swelling  Please watch your bruising closely if it worsens or you begin to have new bruising please go to the nearest emergency department for further evaluation  Please schedule an appointment with your doctor for follow-up and reevaluation for next week     ED Prescriptions     Medication Sig Dispense Auth. Provider   meloxicam (MOBIC) 7.5 MG tablet Take 1 tablet (7.5 mg total) by mouth daily. 30 tablet Hans Eden, NP      PDMP not reviewed this encounter.   Hans Eden, [place] 08/01/21 [phone]

## 2021-09-08 DIAGNOSIS — Z0000 Encounter for general adult medical examination without abnormal findings: Secondary | ICD-10-CM | POA: Diagnosis not present

## 2021-09-08 DIAGNOSIS — F321 Major depressive disorder, single episode, moderate: Secondary | ICD-10-CM | POA: Insufficient documentation

## 2021-09-08 DIAGNOSIS — F4310 Post-traumatic stress disorder, unspecified: Secondary | ICD-10-CM | POA: Diagnosis not present

## 2021-09-08 DIAGNOSIS — Z1331 Encounter for screening for depression: Secondary | ICD-10-CM | POA: Diagnosis not present

## 2021-09-08 DIAGNOSIS — E782 Mixed hyperlipidemia: Secondary | ICD-10-CM | POA: Diagnosis not present

## 2021-09-08 DIAGNOSIS — R7303 Prediabetes: Secondary | ICD-10-CM | POA: Insufficient documentation

## 2021-09-08 DIAGNOSIS — I10 Essential (primary) hypertension: Secondary | ICD-10-CM | POA: Diagnosis not present

## 2021-09-08 DIAGNOSIS — K581 Irritable bowel syndrome with constipation: Secondary | ICD-10-CM | POA: Diagnosis not present

## 2021-09-08 DIAGNOSIS — F411 Generalized anxiety disorder: Secondary | ICD-10-CM | POA: Diagnosis not present

## 2021-09-08 DIAGNOSIS — E559 Vitamin D deficiency, unspecified: Secondary | ICD-10-CM | POA: Diagnosis not present

## 2021-09-08 DIAGNOSIS — G4700 Insomnia, unspecified: Secondary | ICD-10-CM | POA: Diagnosis not present

## 2021-09-08 DIAGNOSIS — K219 Gastro-esophageal reflux disease without esophagitis: Secondary | ICD-10-CM | POA: Diagnosis not present

## 2021-09-08 DIAGNOSIS — Z87442 Personal history of urinary calculi: Secondary | ICD-10-CM | POA: Diagnosis not present

## 2021-09-09 ENCOUNTER — Other Ambulatory Visit: Payer: Self-pay | Admitting: Student

## 2021-09-09 DIAGNOSIS — Z1231 Encounter for screening mammogram for malignant neoplasm of breast: Secondary | ICD-10-CM

## 2021-10-04 ENCOUNTER — Ambulatory Visit
Admission: RE | Admit: 2021-10-04 | Discharge: 2021-10-04 | Disposition: A | Discharge status: Home / Self Care | Payer: Medicaid Other | Source: Ambulatory Visit | Attending: Student | Admitting: Student

## 2021-10-04 DIAGNOSIS — Z1231 Encounter for screening mammogram for malignant neoplasm of breast: Secondary | ICD-10-CM | POA: Insufficient documentation

## 2021-10-05 ENCOUNTER — Inpatient Hospital Stay
Admission: RE | Admit: 2021-10-05 | Discharge: 2021-10-05 | Disposition: A | Discharge status: Home / Self Care | Payer: Self-pay | Source: Ambulatory Visit | Attending: *Deleted | Admitting: *Deleted

## 2021-10-05 ENCOUNTER — Other Ambulatory Visit: Payer: Self-pay | Admitting: *Deleted

## 2021-10-05 DIAGNOSIS — Z1231 Encounter for screening mammogram for malignant neoplasm of breast: Secondary | ICD-10-CM

## 2021-10-13 DIAGNOSIS — E782 Mixed hyperlipidemia: Secondary | ICD-10-CM | POA: Diagnosis not present

## 2021-10-13 DIAGNOSIS — E559 Vitamin D deficiency, unspecified: Secondary | ICD-10-CM | POA: Diagnosis not present

## 2021-10-13 DIAGNOSIS — I10 Essential (primary) hypertension: Secondary | ICD-10-CM | POA: Diagnosis not present

## 2021-10-13 DIAGNOSIS — Z131 Encounter for screening for diabetes mellitus: Secondary | ICD-10-CM | POA: Diagnosis not present

## 2021-11-26 ENCOUNTER — Ambulatory Visit
Admission: EM | Admit: 2021-11-26 | Discharge: 2021-11-26 | Disposition: A | Discharge status: Home / Self Care | Payer: Medicaid Other | Attending: Urgent Care | Admitting: Urgent Care

## 2021-11-26 DIAGNOSIS — A084 Viral intestinal infection, unspecified: Secondary | ICD-10-CM | POA: Diagnosis not present

## 2021-11-26 DIAGNOSIS — R110 Nausea: Secondary | ICD-10-CM

## 2021-11-26 LAB — POCT URINALYSIS DIP (MANUAL ENTRY)
Bilirubin, UA: NEGATIVE
Glucose, UA: NEGATIVE mg/dL
Leukocytes, UA: NEGATIVE
Nitrite, UA: NEGATIVE
Protein Ur, POC: NEGATIVE mg/dL
Spec Grav, UA: 1.025 (ref 1.010–1.025)
Urobilinogen, UA: 0.2 E.U./dL
pH, UA: 5.5 (ref 5.0–8.0)

## 2021-11-26 MED ORDER — ONDANSETRON HCL 4 MG PO TABS: 4.0000 mg | ORAL_TABLET | Freq: Four times a day (QID) | ORAL | 0 refills | Status: AC

## 2021-11-26 MED ORDER — ONDANSETRON 4 MG PO TBDP
4.0000 mg | ORAL_TABLET | Freq: Once | ORAL | Status: AC
  Administered 2021-11-26: 4 mg via ORAL

## 2021-11-26 NOTE — ED Provider Notes (Signed)
Ashley Mcpherson    CSN: [id number] Arrival date & time: 11/26/21  1024      History   Chief Complaint Chief Complaint  Patient presents with   Nausea   urinary discomfort     HPI Ashley Mcpherson is a 46 y.o. female.   HPI  Presents to urgent care with symptoms that started Wednesday.  She complains of nausea and generalized abdominal pain.  Also feeling very fatigued and "lethargic."  "Loose" bowel movements but not liquidy.  Some discomfort with urination and expresses concern for possible UTI.  Patient endorses history of IBS-C  Recurrent nausea is preventing her from drinking adequate fluids.  She has little appetite.  This odes of diarrhea when she drinks water.  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/2019   Local superficial swelling, mass or lump 08/13/2018   Constipation 08/13/2018   Umbilical hernia without obstruction and without gangrene 08/07/2018   Flank pain 06/02/2018   Renal calculi 06/02/2018   Encounter for general adult medical examination with abnormal findings 11/28/2017   Post traumatic stress disorder 11/28/2017   Urinary tract infection with hematuria 08/12/2017   Dysuria 08/12/2017   Chronic bilateral low back pain 08/12/2017   Essential hypertension 04/17/2017    Past Surgical History:  Procedure Laterality Date   CRYOABLATION  2010   TUBAL LIGATION  2007   VAGINAL HYSTERECTOMY  2013   Dr. [facility])    OB History   No obstetric history on file.      Home Medications    Prior to Admission medications   Medication Sig Start Date End Date Taking? Authorizing Provider  albuterol (VENTOLIN HFA) 108 (90 Base)  MCG/ACT inhaler Inhale 2 puffs into the lungs every 6 (six) hours as needed for wheezing or shortness of breath. 03/15/20   Carlean Jews, NP  azithromycin (ZITHROMAX) 250 MG tablet z-pack - take as directed for 5 days 03/15/20   Carlean Jews, NP  escitalopram (LEXAPRO) 5 MG tablet Take 1 tablet (5 mg total) by mouth daily. 07/01/20   McDonough, Salomon Fick, PA-C  ketorolac (TORADOL) 10 MG tablet Take 1 tablet (10 mg total) by mouth 2 (two) times daily as needed for moderate pain or severe pain (back pain). 05/28/21   Bing Neighbors, FNP  linaclotide (LINZESS) 290 MCG CAPS capsule Take 290 mcg by mouth daily before breakfast.    [provider]  losartan-hydrochlorothiazide (HYZAAR) 50-12.5 MG tablet Take 1 tablet by mouth daily. 05/28/21   Bing Neighbors, FNP  meclizine (ANTIVERT) 25 MG tablet Take 1 tablet (25 mg total) by mouth 3 (three) times daily as needed for dizziness. 05/03/20   Ivette Loyal, NP  meloxicam (MOBIC) 7.5 MG tablet Take 1 tablet (7.5 mg total) by mouth daily. 07/28/21   Valinda Hoar, NP  methylPREDNISolone (MEDROL) 4 MG TBPK tablet Take by mouth as directed for 6 days 03/15/20   Carlean Jews, NP  omeprazole (PRILOSEC) 40 MG capsule Take 40 mg by mouth daily.    [provider]  polyethylene glycol powder (GLYCOLAX/MIRALAX) 17 GM/SCOOP powder Take 17 g by mouth daily as needed  for moderate constipation. 07/29/18   Minna Antis, MD  zolpidem (AMBIEN) 5 MG tablet Take 1 tablet (5 mg total) by mouth at bedtime as needed for sleep. 07/01/20   McDonough, Salomon Fick, PA-C    Family History Family History  Problem Relation Age of Onset   Diabetes Mother    Hypertension Mother    Diabetes Father    Hypertension Father    Bladder Cancer Neg Hx    Kidney cancer Neg Hx     Social History Social History   Tobacco Use   Smoking status: Never   Smokeless tobacco: Never  Vaping Use   Vaping Use: Never used  Substance Use Topics   Alcohol  use: No   Drug use: No     Allergies   Sulfa antibiotics   Review of Systems Review of Systems   Physical Exam Triage Vital Signs ED Triage Vitals  Enc Vitals Group     BP 11/26/21 1102 122/80     Pulse Rate 11/26/21 1102 70     Resp 11/26/21 1102 15     Temp 11/26/21 1102 97.7 F (36.5 C)     Temp src --      SpO2 11/26/21 1102 100 %     Weight --      Height --      Head Circumference --      Peak Flow --      Pain Score 11/26/21 1103 0     Pain Loc --      Pain Edu? --      Excl. in [place]? --    No data found.  Updated Vital Signs BP 122/80   Pulse 70   Temp 97.7 F (36.5 C)   Resp 15   SpO2 100%   Visual Acuity Right Eye Distance:   Left Eye Distance:   Bilateral Distance:    Right Eye Near:   Left Eye Near:    Bilateral Near:     Physical Exam Vitals reviewed.  Constitutional:      Appearance: Normal appearance.  Abdominal:     General: Bowel sounds are increased.     Palpations: Abdomen is soft.     Tenderness: There is generalized abdominal tenderness. There is no right CVA tenderness or left CVA tenderness.  Skin:    General: Skin is warm and dry.  Neurological:     General: No focal deficit present.     Mental Status: She is alert and oriented to person, place, and time.  Psychiatric:        Mood and Affect: Mood normal.        Behavior: Behavior normal.      UC Treatments / Results  Labs (all labs ordered are listed, but only abnormal results are displayed) Labs Reviewed  POCT URINALYSIS DIP (MANUAL ENTRY) - Abnormal; Notable for the following components:      Result Value   Ketones, POC UA trace (5) (*)    Blood, UA moderate (*)    All other components within normal limits    EKG   Radiology No results found.  Procedures Procedures (including critical care time)  Medications Ordered in UC Medications - No data to display  Initial Impression / Assessment and Plan / UC Course  I have reviewed the triage vital signs  and the nursing notes.  Pertinent labs & imaging results that were available during my care of the patient were reviewed by me and considered in my medical  decision making (see chart for details).   Physical exam is remarkable for mildly tender abdomen and increased bowel sounds.  Negative surgical abdomen.  Negative Murphy sign.  Negative McBurney's sign.  No guarding or rebound.  Suspect viral etiology for her abdominal symptoms.  Will treat nausea with antiemetic and encourage hydration and lecture light replenishment.  May use OTC Imodium if she has liquid stools though I feel this is likely unnecessary.  She describes stools as "soft", not liquid.  Follow-up with your primary care provider if symptoms do not resolve within 1 week.   Final Clinical Impressions(s) / UC Diagnoses   Final diagnoses:  None   Discharge Instructions   None    ED Prescriptions   None    PDMP not reviewed this encounter.   Rose Phi, [place] 11/26/21 1126

## 2021-11-26 NOTE — ED Triage Notes (Signed)
Pt. States she has been feeling nauseous and lethargic since Wednesday. Pt. Also endorses discomfort when urinating. Pt. Expresses concern for UTI.

## 2021-11-26 NOTE — Discharge Instructions (Addendum)
You may use OTC Imodium if your bowel movements are liquidy.  Eat bland foods and push hydration for electrolyte and fluid replenishment.  Follow up with your primary care provider if your symptoms do not resolve after a few days.

## 2022-04-12 ENCOUNTER — Emergency Department
Admission: EM | Admit: 2022-04-12 | Discharge: 2022-04-12 | Disposition: A | Discharge status: Home / Self Care | Payer: Medicaid Other | Attending: Emergency Medicine | Admitting: Emergency Medicine

## 2022-04-12 ENCOUNTER — Other Ambulatory Visit: Payer: Self-pay

## 2022-04-12 ENCOUNTER — Emergency Department: Payer: Medicaid Other

## 2022-04-12 DIAGNOSIS — I10 Essential (primary) hypertension: Secondary | ICD-10-CM | POA: Insufficient documentation

## 2022-04-12 DIAGNOSIS — D649 Anemia, unspecified: Secondary | ICD-10-CM | POA: Insufficient documentation

## 2022-04-12 DIAGNOSIS — R110 Nausea: Secondary | ICD-10-CM | POA: Diagnosis not present

## 2022-04-12 DIAGNOSIS — R109 Unspecified abdominal pain: Secondary | ICD-10-CM | POA: Diagnosis not present

## 2022-04-12 DIAGNOSIS — R1013 Epigastric pain: Secondary | ICD-10-CM | POA: Insufficient documentation

## 2022-04-12 LAB — COMPREHENSIVE METABOLIC PANEL
ALT: 14 U/L (ref 0–44)
AST: 18 U/L (ref 15–41)
Albumin: 3.9 g/dL (ref 3.5–5.0)
Alkaline Phosphatase: 47 U/L (ref 38–126)
Anion gap: 9 (ref 5–15)
BUN: 15 mg/dL (ref 6–20)
CO2: 28 mmol/L (ref 22–32)
Calcium: 8.8 mg/dL — ABNORMAL LOW (ref 8.9–10.3)
Chloride: 100 mmol/L (ref 98–111)
Creatinine, Ser: 0.89 mg/dL (ref 0.44–1.00)
GFR, Estimated: >60 mL/min (ref >60)
Glucose, Bld: 105 mg/dL — ABNORMAL HIGH (ref 70–99)
Potassium: 3 mmol/L — ABNORMAL LOW (ref 3.5–5.1)
Sodium: 137 mmol/L (ref 135–145)
Total Bilirubin: 0.9 mg/dL (ref 0.3–1.2)
Total Protein: 7.6 g/dL (ref 6.5–8.1)

## 2022-04-12 LAB — CBC
HCT: 35.9 % — ABNORMAL LOW (ref 36.0–46.0)
Hemoglobin: 11 g/dL — ABNORMAL LOW (ref 12.0–15.0)
MCH: 23.4 pg — ABNORMAL LOW (ref 26.0–34.0)
MCHC: 30.6 g/dL (ref 30.0–36.0)
MCV: 76.2 fL — ABNORMAL LOW (ref 80.0–100.0)
Platelets: 294 10*3/uL (ref 150–400)
RBC: 4.71 MIL/uL (ref 3.87–5.11)
RDW: 14.7 % (ref 11.5–15.5)
WBC: 8.4 10*3/uL (ref 4.0–10.5)
nRBC: 0 % (ref 0.0–0.2)

## 2022-04-12 LAB — LIPASE, BLOOD: Lipase: 30 U/L (ref 11–51)

## 2022-04-12 LAB — POC URINE PREG, ED: Preg Test, Ur: NEGATIVE

## 2022-04-12 LAB — URINALYSIS, ROUTINE W REFLEX MICROSCOPIC
Bilirubin Urine: NEGATIVE
Glucose, UA: NEGATIVE mg/dL
Hgb urine dipstick: NEGATIVE
Ketones, ur: NEGATIVE mg/dL
Leukocytes,Ua: NEGATIVE
Nitrite: NEGATIVE
Protein, ur: NEGATIVE mg/dL
Specific Gravity, Urine: 1.021 (ref 1.005–1.030)
pH: 5 (ref 5.0–8.0)

## 2022-04-12 MED ORDER — POTASSIUM CHLORIDE CRYS ER 20 MEQ PO TBCR
40.0000 meq | EXTENDED_RELEASE_TABLET | Freq: Once | ORAL | Status: AC
  Administered 2022-04-12: 40 meq via ORAL
  Filled 2022-04-12: qty 2

## 2022-04-12 MED ORDER — ONDANSETRON 4 MG PO TBDP: 4.0000 mg | ORAL_TABLET | Freq: Three times a day (TID) | ORAL | 0 refills | Status: DC | PRN

## 2022-04-12 NOTE — ED Provider Notes (Signed)
[facility] Provider Note    Event Date/Time   First MD Initiated Contact with Patient 04/12/22 1834     (approximate)   History   Chief Complaint Abdominal Pain   HPI Ashley Mcpherson is a 47 y.o. female, history of hypertension, nephrolithiasis, hypertension, IBS, presents to the emergency department for evaluation of abdominal pain/nausea x 3 days.  Patient states that she has been getting intermittent episodes of nausea with abdominal discomfort.  She states that she has had a persistent "lump" in her mid/epigastric abdomen over the past few years and is unsure what it is.  She has never had any scans before.  Denies fever/chills, chest pain, shortness of breath, flank pain, urinary symptoms, headache, weakness, restless lesions, paresthesias, or dizziness/lightheadedness.  History Limitations: No limitations.        Physical Exam  Triage Vital Signs: ED Triage Vitals  Enc Vitals Group     BP 04/12/22 1826 (!) 151/91     Pulse Rate 04/12/22 1826 90     Resp 04/12/22 1826 18     Temp 04/12/22 1826 98 F (36.7 C)     Temp src --      SpO2 04/12/22 1826 100 %     Weight --      Height --      Head Circumference --      Peak Flow --      Pain Score 04/12/22 1825 5     Pain Loc --      Pain Edu? --      Excl. in [place]? --     Most recent vital signs: Vitals:   04/12/22 1826  BP: (!) 151/91  Pulse: 90  Resp: 18  Temp: 98 F (36.7 C)  SpO2: 100%    General: Awake, NAD.  Skin: Warm, dry. No rashes or lesions.  Eyes: PERRL. Conjunctivae normal.  CV: Good peripheral perfusion.  Resp: Normal effort.  Lung sounds are clear bilaterally. Abd: Soft, non-tender.  Very mild distention in the upper abdominal region. Neuro: At baseline. No gross neurological deficits.  Musculoskeletal: Normal ROM of all extremities.  Physical Exam    ED Results / Procedures / Treatments  Labs (all labs ordered are listed, but only abnormal results are  displayed) Labs Reviewed  COMPREHENSIVE METABOLIC PANEL - Abnormal; Notable for the following components:      Result Value   Potassium 3.0 (*)    Glucose, Bld 105 (*)    Calcium 8.8 (*)    All other components within normal limits  CBC - Abnormal; Notable for the following components:   Hemoglobin 11.0 (*)    HCT 35.9 (*)    MCV 76.2 (*)    MCH 23.4 (*)    All other components within normal limits  URINALYSIS, ROUTINE W REFLEX MICROSCOPIC - Abnormal; Notable for the following components:   Color, Urine YELLOW (*)    APPearance CLOUDY (*)    All other components within normal limits  LIPASE, BLOOD  POC URINE PREG, ED     EKG N/A.    RADIOLOGY  ED Provider Interpretation: I personally viewed and interpreted the CT scan, no evidence of acute abnormalities.  CT ABDOMEN PELVIS WO CONTRAST  Result Date: 04/12/2022 CLINICAL DATA:  Epigastric abdominal pain, nausea EXAM: CT ABDOMEN AND PELVIS WITHOUT CONTRAST TECHNIQUE: Multidetector CT imaging of the abdomen and pelvis was performed following the standard protocol without IV contrast. RADIATION DOSE REDUCTION: This exam was performed according  to the departmental dose-optimization program which includes automated exposure control, adjustment of the mA and/or kV according to patient size and/or use of iterative reconstruction technique. COMPARISON:  07/29/2018 FINDINGS: Lower chest: Lung bases are clear. Hepatobiliary: Unenhanced liver is unremarkable. Gallbladder is decompressed. No intrahepatic or extrahepatic ductal dilatation. Pancreas: Within normal limits. Spleen: Within normal limits. Adrenals/Urinary Tract: Adrenal glands are within normal limits. Approximately 8-10 bilateral nonobstructing renal calculi, left greater than right, measuring up to 6 mm (coronal image 89). No ureteral or bladder calculi. No hydronephrosis. Bladder is within normal limits. Stomach/Bowel: Stomach is within normal limits. No evidence of bowel  obstruction. Normal appendix (series 2/image 50). No colonic wall thickening or inflammatory changes. Vascular/Lymphatic: No evidence of abdominal aortic aneurysm. No suspicious abdominopelvic lymphadenopathy. Reproductive: Prostate is unremarkable. Other: No abdominopelvic ascites. Tiny fat containing left inguinal hernia. Mild fat in the right inguinal canal. Musculoskeletal: Visualized osseous structures are within normal limits. IMPRESSION: Multiple bilateral nonobstructing renal calculi, measuring up to 6 mm, as above. No ureteral or bladder calculi. No hydronephrosis. Otherwise negative CT abdomen/pelvis. Electronically Signed   By: Julian Hy M.D.   On: 04/12/2022 20:06    PROCEDURES:  Critical Care performed: N/A.  Procedures    MEDICATIONS ORDERED IN ED: Medications  potassium chloride SA (KLOR-CON M) CR tablet 40 mEq (has no administration in time range)     IMPRESSION / MDM / ASSESSMENT AND PLAN / ED COURSE  I reviewed the triage vital signs and the nursing notes.                              Differential diagnosis includes, but is not limited to, gastritis, pancreatitis, cystitis, pyelonephritis, ureterolithiasis, diaphragmatic hernia, umbilical hernia, GERD, gastric/peptic ulcers.  ED Course Patient appears well, vitals within normal limits.  NAD.  CBC shows no leukocytosis.  Mild anemia present at 11.0 hemoglobin, consistent with baseline.  Urinalysis shows no evidence of infection.  Urine pregnancy negative.  Lipase unremarkable at 30.  Assessment/Plan Patient presents with intermittent abdominal discomfort and nausea x 3 days, in the setting of an unknown "lump" in the epigastric region.  She appears well clinically.  Does not have any remarkable tenderness on exam.  She is currently asymptomatic at this time.  Lab workup is reassuring.  Vitals are within normal limits.  CT abdomen/pelvis does not show any acute abnormalities.  No clear explanation for the mild  distention in the epigastric region, but it does not appear serious or life-threatening.  No evidence of pancreatitis on lab workup.  Possible GERD/gastric/peptic ulcers, however it does not appear to be exacerbated by foods.  Encouraged her to follow with her primary care provider.  She states that her primary symptom is the intermittent nausea.  Will provide her with a brief course of ondansetron.  Encouraged her to continue to monitor her symptoms and return to the emergency department if needed.  She is amenable to this.  Will discharge.  Considered admission, but given her stable presentation and unremarkable findings, she is unlikely benefit from admission.  Provided the patient with anticipatory guidance, return precautions, and educational material. Encouraged the patient to return to the emergency department at any time if they begin to experience any new or worsening symptoms. Patient expressed understanding and agreed with the plan.   Patient's presentation is most consistent with acute complicated illness / injury requiring diagnostic workup.       FINAL CLINICAL IMPRESSION(S) /  ED DIAGNOSES   Final diagnoses:  Nausea     Rx / DC Orders   ED Discharge Orders          Ordered    ondansetron (ZOFRAN-ODT) 4 MG disintegrating tablet  Every 8 hours PRN        04/12/22 2057             Note:  This document was prepared using Dragon voice recognition software and may include unintentional dictation errors.   Teodoro Spray, [place] 04/12/22 2107    Blake Divine, MD 04/12/22 [id number]

## 2022-04-12 NOTE — Discharge Instructions (Addendum)
-  We are unsure exactly what is causing your nausea, however it does not appear to be anything serious or life-threatening.  Your CT scan did not show any major abnormalities.  It did show some kidney stones and both kidneys, however they do not appear to be obstructing anything and should not be feeling any symptoms from this.  -Continue to monitor your symptoms at home.  You may take the ondansetron as needed for the nausea/vomiting.  Return to the emergency department anytime if you begin to experience any new or worsening symptoms.

## 2022-04-12 NOTE — ED Triage Notes (Signed)
First Nurse Note:  C/O a 'lump' to abdomen and nausea.  AAOx3.  Skin warm and dry. NAD

## 2022-04-12 NOTE — ED Triage Notes (Addendum)
Pt comes with c/o belly pain and some nausea. Pt states this all started few days ago. Pt stats lump in stomach for awhile.   Pt states possible UTI that has been going on for awhile now.

## 2022-04-13 ENCOUNTER — Telehealth: Payer: Self-pay

## 2022-04-13 NOTE — Patient Outreach (Signed)
Transition Care Management Follow-up Telephone Call Date of discharge and from where: 04/12/22 [facility] How have you been since you were released from the hospital? Im feeling better Any questions or concerns? No  Items Reviewed: Did the pt receive and understand the discharge instructions provided? Yes  Medications obtained and verified? No  Other? No  Any new allergies since your discharge? No  Dietary orders reviewed? No Do you have support at home? Yes   Home Care and Equipment/Supplies: Were home health services ordered? not applicable If so, what is the name of the agency?   Has the agency set up a time to come to the patient's home? not applicable Were any new equipment or medical supplies ordered?  No What is the name of the medical supply agency?  Were you able to get the supplies/equipment? not applicable Do you have any questions related to the use of the equipment or supplies? No  Functional Questionnaire: (I = Independent and D = Dependent) ADLs: I  Bathing/Dressing- I  Meal Prep- I  Eating- I  Maintaining continence- I  Transferring/Ambulation- I  Managing Meds- I  Follow up appointments reviewed:  PCP Hospital f/u appt confirmed? Yes  Scheduled to see Rosita Fire on 2/624 @ 9:00. [facility] f/u appt confirmed? No  Scheduled to see  on  @ . Are transportation arrangements needed? No  If their condition worsens, is the pt aware to call PCP or go to the Emergency Dept.? Yes Was the patient provided with contact information for the PCP's office or ED? Yes Was to pt encouraged to call back with questions or concerns? Yes
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
## 2022-04-18 ENCOUNTER — Encounter: Payer: Self-pay | Admitting: Family Medicine

## 2022-04-18 ENCOUNTER — Ambulatory Visit: Payer: Medicaid Other | Admitting: Family Medicine

## 2022-04-18 VITALS — BP 142/99 | HR 68 | Temp 97.9°F | Resp 16 | Ht 64.0 in | Wt 184.0 lb

## 2022-04-18 DIAGNOSIS — E876 Hypokalemia: Secondary | ICD-10-CM | POA: Diagnosis not present

## 2022-04-18 DIAGNOSIS — T502X5A Adverse effect of carbonic-anhydrase inhibitors, benzothiadiazides and other diuretics, initial encounter: Secondary | ICD-10-CM

## 2022-04-18 DIAGNOSIS — F410 Panic disorder [episodic paroxysmal anxiety] without agoraphobia: Secondary | ICD-10-CM

## 2022-04-18 DIAGNOSIS — R229 Localized swelling, mass and lump, unspecified: Secondary | ICD-10-CM

## 2022-04-18 DIAGNOSIS — N200 Calculus of kidney: Secondary | ICD-10-CM

## 2022-04-18 DIAGNOSIS — I10 Essential (primary) hypertension: Secondary | ICD-10-CM | POA: Diagnosis not present

## 2022-04-18 MED ORDER — POTASSIUM CHLORIDE CRYS ER 10 MEQ PO TBCR: 20.0000 meq | EXTENDED_RELEASE_TABLET | Freq: Two times a day (BID) | ORAL | 0 refills | Status: DC

## 2022-04-18 MED ORDER — IBUPROFEN 600 MG PO TABS: 600.0000 mg | ORAL_TABLET | Freq: Three times a day (TID) | ORAL | 0 refills | Status: DC | PRN

## 2022-04-18 NOTE — Assessment & Plan Note (Signed)
Blood pressure elevated in office today Patient recently diagnosed with renal calculi and suspect this pain related to elevation of blood pressure Will have patient follow-up in 3 weeks for repeat blood pressure check Will have patient monitor blood pressure at home with goal of less than 140/90 Patient will continue losartan hydrochlorothiazide 50-12.5 mg daily Will plan to check CMP at next appointment

## 2022-04-18 NOTE — Assessment & Plan Note (Signed)
Patient reports history of panic disorder Chronic Stable Reports taking medication for this as needed but is unsure the name Recommended the patient bring this medication with her to follow-up appointment in 3 weeks

## 2022-04-18 NOTE — Assessment & Plan Note (Signed)
chronic Patient hydrochlorothiazide 12.5 mg retention Reviewed labs from 1/30 ED visit potassium level is low at 3.0 Prescribed Klor-Con 20 mill equivalents twice daily for 14 days Patient will follow-up in 3 weeks Will plan to check metabolic panel at that time

## 2022-04-18 NOTE — Assessment & Plan Note (Signed)
Reviewed patient's CT obtained on 04/12/2022 Patient noted to have 8 nonobstructing renal calculi measuring 6 mm at maximum Recommended patient continue to consume copious amounts of fluids to help with passing stones Prescribed ibuprofen 600 mg every 8 hours for pain control Will follow-up in 3 weeks Patient may benefit from urology referral for frequent renal calculi

## 2022-04-18 NOTE — Patient Instructions (Addendum)
It was a pleasure to see you today!  Thank you for choosing [facility] for your primary care.   Ashley Mcpherson was seen for establishing care.   Our plans for today were: High Blood pressure: Please continue to take your losartan-hydrochlorothiazide as well as measure  Your blood pressure daily until you return for your next visit. Goal is for your blood pressure to be less than 140/90  Please refer to the information below for dietary recommendations to help with blood pressure and cholesterol management.  I have prescribed potassium to help with increasing your levels. Please take 2 tablets twice daily until your next visit.  I have prescribed Motrin to help with the pain related to your kidney stones. Please continue to drink 6-8 bottles of water per day to help with passing the stones.   To keep you healthy, please keep in mind the following health maintenance items that you are due for:   Hepatitis C screening  Colonoscopy Influenza Vaccine  COVID vaccine   You should return to our clinic in 3 weeks for blood pressure   Best Wishes,   Dr. Quentin Cornwall    Heart-Healthy Eating Plan Eating a healthy diet is important for the health of your heart. A heart-healthy eating plan includes: Eating less unhealthy fats. Eating more healthy fats. Eating less salt in your food. Salt is also called sodium. Making other changes in your diet. Talk with your doctor or a diet specialist (dietitian) to create an eating plan that is right for you. What is my plan? Your doctor may recommend an eating plan that includes: Total fat: ______% or less of total calories a day. Saturated fat: ______% or less of total calories a day. Cholesterol: less than _________mg a day. Sodium: less than _________mg a day. What are tips for following this plan? Cooking Avoid frying your food. Try to bake, boil, grill, or broil it instead. You can also reduce fat by: Removing the skin from  poultry. Removing all visible fats from meats. Steaming vegetables in water or broth. Meal planning  At meals, divide your plate into four equal parts: Fill one-half of your plate with vegetables and green salads. Fill one-fourth of your plate with whole grains. Fill one-fourth of your plate with lean protein foods. Eat 2-4 cups of vegetables per day. One cup of vegetables is: 1 cup (91 g) broccoli or cauliflower florets. 2 medium carrots. 1 large bell pepper. 1 large sweet potato. 1 large tomato. 1 medium white potato. 2 cups (150 g) raw leafy greens. Eat 1-2 cups of fruit per day. One cup of fruit is: 1 small apple 1 large banana 1 cup (237 g) mixed fruit, 1 large orange,  cup (82 g) dried fruit, 1 cup (240 mL) 100% fruit juice. Eat more foods that have soluble fiber. These are apples, broccoli, carrots, beans, peas, and barley. Try to get 20-30 g of fiber per day. Eat 4-5 servings of nuts, legumes, and seeds per week: 1 serving of dried beans or legumes equals  cup (90 g) cooked. 1 serving of nuts is  oz (12 almonds, 24 pistachios, or 7 walnut halves). 1 serving of seeds equals  oz (8 g). General information Eat more home-cooked food. Eat less restaurant, buffet, and fast food. Limit or avoid alcohol. Limit foods that are high in starch and sugar. Avoid fried foods. Lose weight if you are overweight. Keep track of how much salt (sodium) you eat. This is important if you have  high blood pressure. Ask your doctor to tell you more about this. Try to add vegetarian meals each week. Fats Choose healthy fats. These include olive oil and canola oil, flaxseeds, walnuts, almonds, and seeds. Eat more omega-3 fats. These include salmon, mackerel, sardines, tuna, flaxseed oil, and ground flaxseeds. Try to eat fish at least 2 times each week. Check food labels. Avoid foods with trans fats or high amounts of saturated fat. Limit saturated fats. These are often found in animal  products, such as meats, butter, and cream. These are also found in plant foods, such as palm oil, palm kernel oil, and coconut oil. Avoid foods with partially hydrogenated oils in them. These have trans fats. Examples are stick margarine, some tub margarines, cookies, crackers, and other baked goods. What foods should I eat? Fruits All fresh, canned (in natural juice), or frozen fruits. Vegetables Fresh or frozen vegetables (raw, steamed, roasted, or grilled). Green salads. Grains Most grains. Choose whole wheat and whole grains most of the time. Rice and pasta, including brown rice and pastas made with whole wheat. Meats and other proteins Lean, well-trimmed beef, veal, pork, and lamb. Chicken and [place] without skin. All fish and shellfish. Wild duck, rabbit, pheasant, and venison. Egg whites or low-cholesterol egg substitutes. Dried beans, peas, lentils, and tofu. Seeds and most nuts. Dairy Low-fat or nonfat cheeses, including ricotta and mozzarella. Skim or 1% milk that is liquid, powdered, or evaporated. Buttermilk that is made with low-fat milk. Nonfat or low-fat yogurt. Fats and oils Non-hydrogenated (trans-free) margarines. Vegetable oils, including soybean, sesame, sunflower, olive, peanut, safflower, corn, canola, and cottonseed. Salad dressings or mayonnaise made with a vegetable oil. Beverages Mineral water. Coffee and tea. Diet carbonated beverages. Sweets and desserts Sherbet, gelatin, and fruit ice. Small amounts of dark chocolate. Limit all sweets and desserts. Seasonings and condiments All seasonings and condiments. The items listed above may not be a complete list of foods and drinks you can eat. Contact a dietitian for more options. What foods should I avoid? Fruits Canned fruit in heavy syrup. Fruit in cream or butter sauce. Fried fruit. Limit coconut. Vegetables Vegetables cooked in cheese, cream, or butter sauce. Fried vegetables. Grains Breads that are made with  saturated or trans fats, oils, or whole milk. Croissants. Sweet rolls. Donuts. High-fat crackers, such as cheese crackers. Meats and other proteins Fatty meats, such as hot dogs, ribs, sausage, bacon, rib-eye roast or steak. High-fat deli meats, such as salami and bologna. Caviar. Domestic duck and goose. Organ meats, such as liver. Dairy Cream, sour cream, cream cheese, and creamed cottage cheese. Whole-milk cheeses. Whole or 2% milk that is liquid, evaporated, or condensed. Whole buttermilk. Cream sauce or high-fat cheese sauce. Yogurt that is made from whole milk. Fats and oils Meat fat, or shortening. Cocoa butter, hydrogenated oils, palm oil, coconut oil, palm kernel oil. Solid fats and shortenings, including bacon fat, salt pork, lard, and butter. Nondairy cream substitutes. Salad dressings with cheese or sour cream. Beverages Regular sodas and juice drinks with added sugar. Sweets and desserts Frosting. Pudding. Cookies. Cakes. Pies. Milk chocolate or white chocolate. Buttered syrups. Full-fat ice cream or ice cream drinks. The items listed above may not be a complete list of foods and drinks to avoid. Contact a dietitian for more information. Summary Heart-healthy meal planning includes eating less unhealthy fats, eating more healthy fats, and making other changes in your diet. Eat a balanced diet. This includes fruits and vegetables, low-fat or nonfat dairy, lean protein, nuts  and legumes, whole grains, and heart-healthy oils and fats. This information is not intended to replace advice given to you by your health care provider. Make sure you discuss any questions you have with your health care provider. Document Revised: 04/04/2021 Document Reviewed: 04/04/2021 Elsevier Patient Education  [place].

## 2022-04-18 NOTE — Assessment & Plan Note (Signed)
Patient with epigastric abdominal swelling Nontender today Review CT of abdomen 04/12/2022 from patient's ED visit and did not show findings of abdominal wall defect to monitor this area of swelling

## 2022-05-09 NOTE — Progress Notes (Signed)
Established patient visit   Patient: Ashley Mcpherson   DOB: 06-20-1975   47 y.o. Female  MRN: [id number] Visit Date: 05/10/2022  Today's healthcare provider: Eulis Foster, MD   No chief complaint on file.  Subjective    HPI  Hypertension, follow-up  BP Readings from Last 3 Encounters:  04/18/22 (!) 142/99  04/12/22 (!) 121/100  11/26/21 122/80   Wt Readings from Last 3 Encounters:  04/18/22 184 lb (83.5 kg)  07/01/20 174 lb (78.9 kg)  04/15/20 172 lb (78 kg)     She was last seen for hypertension 3 months ago.  BP at that visit was 142/99. Management since that visit includes monitor blood pressure at home with goal of less than 140/90 Patient will continue losartan hydrochlorothiazide 50-12.5 mg daily.   Outside blood pressures are {***enter patient reported home BP readings, or 'not being checked':1}. Symptoms: {Yes/No:20286} chest pain {Yes/No:20286} chest pressure  {Yes/No:20286} palpitations {Yes/No:20286} syncope  {Yes/No:20286} dyspnea {Yes/No:20286} orthopnea  {Yes/No:20286} paroxysmal nocturnal dyspnea {Yes/No:20286} lower extremity edema   Pertinent labs Lab Results  Component Value Date   CHOL 222 (H) 02/28/2019   HDL 56 02/28/2019   LDLCALC 152 (H) 02/28/2019   TRIG 79 02/28/2019   CHOLHDL 4.2 09/24/2015   Lab Results  Component Value Date   NA 137 04/12/2022   K 3.0 (L) 04/12/2022   CREATININE 0.89 04/12/2022   GFRNONAA >60 04/12/2022   GLUCOSE 105 (H) 04/12/2022   TSH 0.878 02/28/2019     The ASCVD Risk score (Arnett DK, et al., 2019) failed to calculate for the following reasons:   Cannot find a previous HDL lab   Cannot find a previous total cholesterol lab  ---------------------------------------------------------------------------------------------------   Diuretic Induced Hypokalemia: K was 3.0 when measured on 04/12/22, she was treated with PO supplemental potassium 67mq, she is agreeable to rechecking her levels today    Panic Disorder: patient reports being diagnosed with panic disorder and taking as needed medication, today she reports symptoms include ***     Medications: Outpatient Medications Prior to Visit  Medication Sig   albuterol (VENTOLIN HFA) 108 (90 Base) MCG/ACT inhaler Inhale 2 puffs into the lungs every 6 (six) hours as needed for wheezing or shortness of breath.   ibuprofen (ADVIL) 600 MG tablet Take 1 tablet (600 mg total) by mouth every 8 (eight) hours as needed for moderate pain.   losartan-hydrochlorothiazide (HYZAAR) 50-12.5 MG tablet Take 1 tablet by mouth daily.   ondansetron (ZOFRAN-ODT) 4 MG disintegrating tablet Take 1 tablet (4 mg total) by mouth every 8 (eight) hours as needed for nausea or vomiting. (Patient not taking: Reported on 04/18/2022)   potassium chloride (KLOR-CON M) 10 MEQ tablet Take 2 tablets (20 mEq total) by mouth 2 (two) times daily for 14 days.   No facility-administered medications prior to visit.    Review of Systems  {Labs  Heme  Chem  Endocrine  Serology  Results Review (optional):23779}   Objective    There were no vitals taken for this visit. {Show previous vital signs (optional):23777}  Physical Exam  ***  No results found for any visits on 05/10/22.  Assessment & Plan     Problem List Items Addressed This Visit   None    No follow-ups on file.        The entirety of the information documented in the History of Present Illness, Review of Systems and Physical Exam were personally obtained by me. Portions of this  information were initially documented by *** . I, Eulis Foster, MD have reviewed the documentation above for thoroughness and accuracy.      Eulis Foster, MD  [facility] [phone] (phone) [phone] (fax)  [place]

## 2022-05-10 ENCOUNTER — Ambulatory Visit: Payer: Medicaid Other | Admitting: Family Medicine

## 2022-05-10 ENCOUNTER — Encounter: Payer: Self-pay | Admitting: Family Medicine

## 2022-05-10 VITALS — BP 118/78 | HR 69 | Ht 64.0 in | Wt 177.6 lb

## 2022-05-10 DIAGNOSIS — R222 Localized swelling, mass and lump, trunk: Secondary | ICD-10-CM | POA: Diagnosis not present

## 2022-05-10 DIAGNOSIS — E876 Hypokalemia: Secondary | ICD-10-CM | POA: Diagnosis not present

## 2022-05-10 DIAGNOSIS — T502X5A Adverse effect of carbonic-anhydrase inhibitors, benzothiadiazides and other diuretics, initial encounter: Secondary | ICD-10-CM | POA: Diagnosis not present

## 2022-05-10 DIAGNOSIS — F410 Panic disorder [episodic paroxysmal anxiety] without agoraphobia: Secondary | ICD-10-CM

## 2022-05-10 DIAGNOSIS — I10 Essential (primary) hypertension: Secondary | ICD-10-CM

## 2022-05-10 DIAGNOSIS — N200 Calculus of kidney: Secondary | ICD-10-CM | POA: Diagnosis not present

## 2022-05-10 MED ORDER — TAMSULOSIN HCL 0.4 MG PO CAPS: 0.4000 mg | ORAL_CAPSULE | Freq: Every day | ORAL | 3 refills | Status: DC

## 2022-05-10 MED ORDER — TRAMADOL HCL 50 MG PO TABS: 50.0000 mg | ORAL_TABLET | Freq: Three times a day (TID) | ORAL | 0 refills | Status: AC | PRN

## 2022-05-10 NOTE — Assessment & Plan Note (Signed)
K was 3.0 at last visit  CMP ordered to check K today

## 2022-05-10 NOTE — Assessment & Plan Note (Signed)
Chronic  Reoccurring problem  Last stone was found on imaging 04/12/22 per review of EMR  Patient reports that she continues to have pain  States she has taken flomax before for a stone  Will prescribe tramadol '50mg'$  every 8 hours PRN for pain and flomax 0.'4mg'$  daily to help with passing of stone  Submitted urology referral today for frequent calculi of the kidneys

## 2022-05-10 NOTE — Assessment & Plan Note (Signed)
Chronic  No tenderness on abdominal exam today  Will order complete abdominal ultrasound to evaluate for possible hernia

## 2022-05-10 NOTE — Assessment & Plan Note (Signed)
Chronic, stable  No medication changes today

## 2022-05-10 NOTE — Assessment & Plan Note (Signed)
Chronic  BP within goal range today  She will continue current medications at current doses  No changes today  Will order CMP

## 2022-05-11 LAB — COMPREHENSIVE METABOLIC PANEL
ALT: 14 IU/L (ref 0–32)
AST: 18 IU/L (ref 0–40)
Albumin/Globulin Ratio: 1.4 (ref 1.2–2.2)
Albumin: 4.4 g/dL (ref 3.9–4.9)
Alkaline Phosphatase: 55 IU/L (ref 44–121)
BUN/Creatinine Ratio: 10 (ref 9–23)
BUN: 9 mg/dL (ref 6–24)
Bilirubin Total: 0.5 mg/dL (ref 0.0–1.2)
CO2: 25 mmol/L (ref 20–29)
Calcium: 9.1 mg/dL (ref 8.7–10.2)
Chloride: 102 mmol/L (ref 96–106)
Creatinine, Ser: 0.9 mg/dL (ref 0.57–1.00)
Globulin, Total: 3.1 g/dL (ref 1.5–4.5)
Glucose: 85 mg/dL (ref 70–99)
Potassium: 3.7 mmol/L (ref 3.5–5.2)
Sodium: 142 mmol/L (ref 134–144)
Total Protein: 7.5 g/dL (ref 6.0–8.5)
eGFR: 79 mL/min/{1.73_m2} (ref >59)

## 2022-05-16 ENCOUNTER — Telehealth: Payer: Self-pay

## 2022-05-16 DIAGNOSIS — R222 Localized swelling, mass and lump, trunk: Secondary | ICD-10-CM

## 2022-05-16 NOTE — Telephone Encounter (Signed)
Copied from [place] [phone]. Topic: General - Other >> May 16, 2022  9:20 AM Chapman Fitch wrote: Reason for CRM: Ultrasound has an abdominal complete order / but it needs to be an abdomin limited / it can be done with a soft tissue exam to get image/ they are asking if provider can change the order to abdominal limited / please advise

## 2022-05-16 NOTE — Telephone Encounter (Signed)
Ordered

## 2022-05-17 ENCOUNTER — Other Ambulatory Visit: Payer: Self-pay | Admitting: Family Medicine

## 2022-05-17 ENCOUNTER — Ambulatory Visit
Admission: RE | Admit: 2022-05-17 | Discharge: 2022-05-17 | Disposition: A | Discharge status: Home / Self Care | Payer: Medicaid Other | Source: Ambulatory Visit | Attending: Family Medicine | Admitting: Family Medicine

## 2022-05-17 DIAGNOSIS — R222 Localized swelling, mass and lump, trunk: Secondary | ICD-10-CM | POA: Diagnosis not present

## 2022-05-17 DIAGNOSIS — K439 Ventral hernia without obstruction or gangrene: Secondary | ICD-10-CM | POA: Insufficient documentation

## 2022-05-22 NOTE — Progress Notes (Signed)
Patient ID: Ashley Mcpherson, female   DOB: Mar 19, 1975, 47 y.o.   MRN: [id number]  Chief Complaint: Epigastric lump  History of Present Illness Ashley Mcpherson is a 47 y.o. female with a lump in her epigastrium that began years ago.  Her symptoms were primarily limited to soreness that felt as though she had done a lot of sit ups.  She also had associated nausea with every time she eats.  She denies vomiting, she denies any particular association food type.  She got tired of presenting to the ED on multiple occasions not getting an answer for the epigastric bulge.  She has a history of IBS and avoids taking Linzess during the week, utilizes MiraLAX at times and seems to utilize her weekends to restore "regularity." She had previously tried a high-fiber diet, per instructions, but had never tried fiber supplementation. That consisted of shredded wheat cereals.  She would like to restore some degree of regularity, determine the source of nausea, and consider elective hernia repair.  Past Medical History Past Medical History:  Diagnosis Date   GERD (gastroesophageal reflux disease)    Hypertension    IBS (irritable bowel syndrome)    Kidney stone       Past Surgical History:  Procedure Laterality Date   CRYOABLATION  2010   TUBAL LIGATION  2007   VAGINAL HYSTERECTOMY  2013   Dr. [facility])    Allergies  Allergen Reactions   Sulfa Antibiotics Anaphylaxis and Other (See Comments)    Mouth sores  Gets sores in the mouth  Other reaction(s): Stomatitis    Current Outpatient Medications  Medication Sig Dispense Refill   albuterol (VENTOLIN HFA) 108 (90 Base) MCG/ACT inhaler Inhale 2 puffs into the lungs every 6 (six) hours as needed for wheezing or shortness of breath. 1 each 2   ibuprofen (ADVIL) 600 MG tablet Take 1 tablet (600 mg total) by mouth every 8 (eight) hours as needed for moderate pain. 30 tablet 0   losartan-hydrochlorothiazide (HYZAAR) 50-12.5 MG tablet Take 1 tablet by  mouth daily. 90 tablet 0   ondansetron (ZOFRAN-ODT) 4 MG disintegrating tablet Take 1 tablet (4 mg total) by mouth every 8 (eight) hours as needed for nausea or vomiting. 20 tablet 0   tamsulosin (FLOMAX) 0.4 MG CAPS capsule Take 1 capsule (0.4 mg total) by mouth daily. 30 capsule 3   potassium chloride (KLOR-CON M) 10 MEQ tablet Take 2 tablets (20 mEq total) by mouth 2 (two) times daily for 14 days. 56 tablet 0   No current facility-administered medications for this visit.    Family History Family History  Problem Relation Age of Onset   Cancer Mother        Pancreatic Cancer   Diabetes Mother    Hypertension Mother    Diabetes Father    Hypertension Father    Hypertension Maternal Grandmother    Diabetes Maternal Grandmother    Hypertension Maternal Grandfather    Hypertension Paternal Grandfather    Bladder Cancer Neg Hx    Kidney cancer Neg Hx       Social History Social History   Tobacco Use   Smoking status: Never   Smokeless tobacco: Never  Vaping Use   Vaping Use: Never used  Substance Use Topics   Alcohol use: No   Drug use: No        Review of Systems  Constitutional: Negative.   HENT: Negative.    Eyes: Negative.   Respiratory: Negative.  Cardiovascular: Negative.   Gastrointestinal:  Positive for abdominal pain, constipation, diarrhea and nausea. Negative for vomiting.  Genitourinary:  Positive for flank pain and frequency.  Skin: Negative.   Neurological: Negative.   Psychiatric/Behavioral: Negative.       Physical Exam Blood pressure (!) 143/91, pulse 76, temperature 98.1 F (36.7 C), temperature source Oral, height 5\' 4"  (1.626 m), weight 179 lb (81.2 kg), SpO2 100 %. Last Weight  Most recent update: 05/23/2022  9:29 AM    Weight  81.2 kg (179 lb)             CONSTITUTIONAL: Well developed, and nourished, appropriately responsive and aware without distress.   EYES: Sclera non-icteric.   EARS, NOSE, MOUTH AND THROAT:  The oropharynx  is clear. Oral mucosa is pink and moist.   Hearing is intact to voice.  NECK: Trachea is midline, and there is no jugular venous distension.  LYMPH NODES:  Lymph nodes in the neck are not appreciated. RESPIRATORY:  Lungs are clear, and breath sounds are equal bilaterally.  Normal respiratory effort without pathologic use of accessory muscles. CARDIOVASCULAR: Heart is regular in rate and rhythm.   Well perfused.  GI: The abdomen is  soft, nontender, and nondistended. There were no palpable masses, there is a vague adiposity involving the upper half of a mild epigastric diastases recti.  I did not appreciate hepatosplenomegaly.  MUSCULOSKELETAL:  Symmetrical muscle tone appreciated in all four extremities.    SKIN: Skin turgor is normal. No pathologic skin lesions appreciated.  NEUROLOGIC:  Motor and sensation appear grossly normal.  Cranial nerves are grossly without defect. PSYCH:  Alert and oriented to person, place and time. Affect is appropriate for situation.  Data Reviewed I have personally reviewed what is currently available of the patient's imaging, recent labs and medical records.   Labs:     Latest Ref Rng & Units 04/12/2022    6:26 PM 07/29/2021    6:46 PM 07/28/2021    6:13 PM  CBC  WBC 4.0 - 10.5 K/uL 8.4  7.1  7.5   Hemoglobin 12.0 - 15.0 g/dL 96.0  45.4  09.8   Hematocrit 36.0 - 46.0 % 35.9  34.2  32.9   Platelets 150 - 400 K/uL 294  293  312       Latest Ref Rng & Units 05/10/2022    3:42 PM 04/12/2022    6:26 PM 07/29/2021    6:46 PM  CMP  Glucose 70 - 99 mg/dL 85  119  97   BUN 6 - 24 mg/dL 9  15  11    Creatinine 0.57 - 1.00 mg/dL 1.47  8.29  5.62   Sodium 134 - 144 mmol/L 142  137  137   Potassium 3.5 - 5.2 mmol/L 3.7  3.0  3.3   Chloride 96 - 106 mmol/L 102  100  104   CO2 20 - 29 mmol/L 25  28  28    Calcium 8.7 - 10.2 mg/dL 9.1  8.8  8.7   Total Protein 6.0 - 8.5 g/dL 7.5  7.6    Total Bilirubin 0.0 - 1.2 mg/dL 0.5  0.9    Alkaline Phos 44 - 121 IU/L 55  47     AST 0 - 40 IU/L 18  18    ALT 0 - 32 IU/L 14  14        Imaging: Radiological images reviewed:   CLINICAL DATA:  Epigastric abdominal pain, nausea   EXAM: CT  ABDOMEN AND PELVIS WITHOUT CONTRAST   TECHNIQUE: Multidetector CT imaging of the abdomen and pelvis was performed following the standard protocol without IV contrast.   RADIATION DOSE REDUCTION: This exam was performed according to the departmental dose-optimization program which includes automated exposure control, adjustment of the mA and/or kV according to patient size and/or use of iterative reconstruction technique.   COMPARISON:  07/29/2018   FINDINGS: Lower chest: Lung bases are clear.   Hepatobiliary: Unenhanced liver is unremarkable.   Gallbladder is decompressed. No intrahepatic or extrahepatic ductal dilatation.   Pancreas: Within normal limits.   Spleen: Within normal limits.   Adrenals/Urinary Tract: Adrenal glands are within normal limits.   Approximately 8-10 bilateral nonobstructing renal calculi, left greater than right, measuring up to 6 mm (coronal image 89). No ureteral or bladder calculi. No hydronephrosis.   Bladder is within normal limits.   Stomach/Bowel: Stomach is within normal limits.   No evidence of bowel obstruction.   Normal appendix (series 2/image 50).   No colonic wall thickening or inflammatory changes.   Vascular/Lymphatic: No evidence of abdominal aortic aneurysm.   No suspicious abdominopelvic lymphadenopathy.   Reproductive: Prostate is unremarkable.   Other: No abdominopelvic ascites.   Tiny fat containing left inguinal hernia. Mild fat in the right inguinal canal.   Musculoskeletal: Visualized osseous structures are within normal limits.   IMPRESSION: Multiple bilateral nonobstructing renal calculi, measuring up to 6 mm, as above. No ureteral or bladder calculi. No hydronephrosis.   Otherwise negative CT abdomen/pelvis.     Electronically  Signed   By: Charline Bills M.D.   On: 04/12/2022 20:06   CLINICAL DATA:  Probable area within the epigastric region   EXAM: ULTRASOUND ABDOMEN LIMITED   COMPARISON:  CT abdomen pelvis 04/12/2022   FINDINGS: There is a fat containing ventral abdominal wall hernia at the site of palpable concern. The neck measures 1.4 cm. No significant edema or suggestion of inflammation.   IMPRESSION: Fat containing ventral abdominal wall hernia at the site of palpable concern.     Electronically Signed   By: Annia Belt M.D.   On: 05/17/2022 09:32 Within last 24 hrs: No results found.  Assessment    Postprandial nausea Epigastric ventral hernia, 1.4 cm defect.  Containing fat. IBS with constipation. 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   Prediabetes 09/08/2021   History of nephrolithiasis 09/07/2020   Hyperlipidemia, mixed 09/07/2020   Insomnia 09/07/2020   Vitamin D deficiency 09/07/2020   Acute upper respiratory infection 04/02/2020   Shortness of breath 04/02/2020   Encounter for school history and physical examination 03/02/2019   High measles virus antibody titer 03/02/2019   Local superficial swelling, mass or lump 08/13/2018   Irritable bowel syndrome with constipation 08/13/2018   Umbilical hernia without obstruction and without gangrene 08/07/2018   Flank pain 06/02/2018   Renal calculi 06/02/2018   Encounter for general adult medical examination with abnormal findings 11/28/2017   Post traumatic stress disorder 11/28/2017   Urinary tract infection with hematuria 08/12/2017   Dysuria 08/12/2017   Chronic bilateral low  back pain 08/12/2017   Essential hypertension 04/17/2017   H/O: hysterectomy 03/14/2011    Plan  We discussed HIDA scan with ejection fraction to ensure adequate gallbladder function.  We discussed the elective nature of hernia repair with or without mesh, direct versus laparoscopic or robotic approaches.  At present she has no interest in pursuing hernia repair.  We discussed risks of deferring repair, and the elective nature of proceeding with repair.  Advised to pursue a goal of 25 to 30 g of fiber daily.  Made aware that the majority of this may be through natural sources, but advised to be aware of actual consumption and to ensure minimal consumption by daily supplementation.  Various forms of supplements discussed.  Recommended Psyllium husk, that mixes well with applesauce, or the powder which goes down well shaken with chocolate milk.  Strongly advised to consume more fluids to ensure adequate hydration, instructed to watch color of urine to determine adequacy of hydration.  Clarity is pursued in urine output, and bowel activity that correlates to significant meal intake.   We need to avoid deferring having bowel movements, advised to take the time at the first sign of sensation, typically following meals, and in the morning.   Subsequent utilization of MiraLAX may be needed ensure at least daily movement, ideally twice daily bowel movements.  If multiple doses of MiraLAX are necessary utilize them. Never skip a day...  To be regular, we must do the above EVERY day.    Will have her follow-up in 2 to 3 months or as needed based on trying the above fiber supplementation, and her HIDA scan results. Face-to-face time spent with the patient and accompanying care providers(if present) was 45 minutes, with more than 50% of the time spent counseling, educating, and coordinating care of the patient.    These notes generated with voice recognition software. I apologize for typographical  errors.  Campbell Lerner M.D., FACS 05/23/2022, 10:02 AM

## 2022-05-23 ENCOUNTER — Other Ambulatory Visit: Payer: Self-pay

## 2022-05-23 ENCOUNTER — Encounter: Payer: Self-pay | Admitting: Surgery

## 2022-05-23 ENCOUNTER — Ambulatory Visit: Payer: Medicaid Other | Admitting: Surgery

## 2022-05-23 VITALS — BP 143/91 | HR 76 | Temp 98.1°F | Ht 64.0 in | Wt 179.0 lb

## 2022-05-23 DIAGNOSIS — K581 Irritable bowel syndrome with constipation: Secondary | ICD-10-CM | POA: Diagnosis not present

## 2022-05-23 DIAGNOSIS — G4700 Insomnia, unspecified: Secondary | ICD-10-CM | POA: Insufficient documentation

## 2022-05-23 DIAGNOSIS — R110 Nausea: Secondary | ICD-10-CM

## 2022-05-23 DIAGNOSIS — K439 Ventral hernia without obstruction or gangrene: Secondary | ICD-10-CM | POA: Diagnosis not present

## 2022-05-23 DIAGNOSIS — B009 Herpesviral infection, unspecified: Secondary | ICD-10-CM | POA: Insufficient documentation

## 2022-05-23 NOTE — Patient Instructions (Addendum)
HIDA scan scheduled @ [facility] on 05/30/22 arrive at 7:30. Nothing to eat/drink after midnight. No pain medications within 24 hours of testing. We will call with the results.   Please see your follow appointment listed below.    Advised to pursue a goal of 25 to 30 g of fiber daily.  Made aware that the majority of this may be through natural sources, but advised to be aware of actual consumption and to ensure minimal consumption by daily supplementation.  Various forms of supplements discussed.  Recommended Psyllium husk, that mixes well with applesauce, or the powder which goes down well shaken with chocolate milk.  Strongly advised to consume more fluids to ensure adequate hydration, instructed to watch color of urine to determine adequacy of hydration.  Clarity is pursued in urine output, and bowel activity that correlates to significant meal intake.   We need to avoid deferring having bowel movements, advised to take the time at the first sign of sensation, typically following meals, and in the morning.   Subsequent utilization of MiraLAX may be needed ensure at least daily movement, ideally twice daily bowel movements.  If multiple doses of MiraLAX are necessary utilize them. Never skip a day...  To be regular, we must do the above EVERY day.     Ventral Hernia  A ventral hernia is a bulge of tissue from inside the abdomen that pushes through a weak area of the muscles that form the front wall of the abdomen. The tissues inside the abdomen are inside a sac (peritoneum). These tissues include the small intestine, large intestine, and the fatty tissue that covers the intestines (omentum). Sometimes, the bulge that forms a hernia contains intestines. Other hernias contain only fat. Ventral hernias do not go away without surgical treatment. There are several types of ventral hernias. You may have: A hernia at an incision site from previous abdominal surgery (incisional hernia). A hernia just above  the belly button (epigastric hernia), or at the belly button (umbilical hernia). These types of hernias can develop from heavy lifting or straining. A hernia that comes and goes (reducible hernia). It may be visible only when you lift or strain. This type of hernia can be pushed back into the abdomen (reduced). A hernia that traps abdominal tissue inside the hernia (incarcerated hernia). This type of hernia does not reduce. A hernia that cuts off blood flow to the tissues inside the hernia (strangulated hernia). The tissues can start to die if this happens. This is a very painful bulge that cannot be reduced. A strangulated hernia is a medical emergency. What are the causes? This condition is caused by abdominal tissue putting pressure on an area of weakness in the abdominal muscles. What increases the risk? The following factors may make you more likely to develop this condition: Being age 34 or older. Being overweight or obese. Having had previous abdominal surgery, especially if there was an infection after surgery. Having had an injury to the abdominal wall. Frequently lifting or pushing heavy objects. Having had several pregnancies. Having a buildup of fluid inside the abdomen (ascites). Straining to have a bowel movement or to urinate. Having frequent coughing episodes. What are the signs or symptoms? The only symptom of a ventral hernia may be a painless bulge in the abdomen. A reducible hernia may be visible only when you strain, cough, or lift. Other symptoms may include: Dull pain. A feeling of pressure. Signs and symptoms of a strangulated hernia may include: Increasing pain. Nausea  and vomiting. Pain when pressing on the hernia. The skin over the hernia turning red or purple. Constipation. Blood in the stool (feces). How is this diagnosed? This condition may be diagnosed based on: Your symptoms. Your medical history. A physical exam. You may be asked to cough or strain  while standing. These actions increase the pressure inside your abdomen and force the hernia through the opening in your muscles. Your health care provider may try to reduce the hernia by gently pushing the hernia back in. Imaging studies, such as an ultrasound or CT scan. How is this treated? This condition is treated with surgery. If you have a strangulated hernia, surgery is done as soon as possible. If your hernia is small and not incarcerated, you may be asked to lose some weight before surgery. Follow these instructions at home: Follow instructions from your health care provider about eating or drinking restrictions. If you are overweight, your health care provider may recommend that you increase your activity level and eat a healthier diet. Do not lift anything that is heavier than 10 lb (4.5 kg), or the limit that you are told, until your health care provider says that it is safe. Return to your normal activities as told by your health care provider. Ask your health care provider what activities are safe for you. You may need to avoid activities that increase pressure on your hernia. Take over-the-counter and prescription medicines only as told by your health care provider. Keep all follow-up visits. This is important. Contact a health care provider if: Your hernia gets larger. Your hernia becomes painful. Get help right away if: Your hernia becomes increasingly painful. You have pain along with any of the following: Changes in skin color in the area of the hernia. Nausea. Vomiting. Fever. These symptoms may represent a serious problem that is an emergency. Do not wait to see if the symptoms will go away. Get medical help right away. Call your local emergency services (911 in the [place] Do not drive yourself to the hospital. Summary A ventral hernia is a bulge of tissue from inside the abdomen that pushes through a weak area of the muscles that form the front wall of the abdomen. This  condition is treated with surgery, which may be urgent depending on your hernia. Do not lift anything that is heavier than 10 lb (4.5 kg), and follow activity instructions from your health care provider. This information is not intended to replace advice given to you by your health care provider. Make sure you discuss any questions you have with your health care provider. Document Revised: 10/17/2019 Document Reviewed: 10/17/2019 Elsevier Patient Education  [place].

## 2022-05-30 ENCOUNTER — Encounter: Payer: Self-pay | Admitting: Urology

## 2022-05-30 ENCOUNTER — Ambulatory Visit (INDEPENDENT_AMBULATORY_CARE_PROVIDER_SITE_OTHER): Payer: Medicaid Other | Admitting: Urology

## 2022-05-30 ENCOUNTER — Ambulatory Visit
Admission: RE | Admit: 2022-05-30 | Discharge: 2022-05-30 | Disposition: A | Discharge status: Home / Self Care | Payer: Medicaid Other | Source: Ambulatory Visit | Attending: Surgery | Admitting: Surgery

## 2022-05-30 VITALS — BP 124/82 | HR 81 | Ht 64.0 in | Wt 182.0 lb

## 2022-05-30 DIAGNOSIS — N3281 Overactive bladder: Secondary | ICD-10-CM | POA: Diagnosis not present

## 2022-05-30 DIAGNOSIS — R110 Nausea: Secondary | ICD-10-CM

## 2022-05-30 DIAGNOSIS — N3946 Mixed incontinence: Secondary | ICD-10-CM | POA: Diagnosis not present

## 2022-05-30 DIAGNOSIS — N393 Stress incontinence (female) (male): Secondary | ICD-10-CM

## 2022-05-30 DIAGNOSIS — N200 Calculus of kidney: Secondary | ICD-10-CM | POA: Diagnosis not present

## 2022-05-30 MED ORDER — OXYBUTYNIN CHLORIDE ER 10 MG PO TB24: 10.0000 mg | ORAL_TABLET | Freq: Every day | ORAL | 11 refills | Status: DC

## 2022-05-30 NOTE — Patient Instructions (Signed)

## 2022-05-30 NOTE — Progress Notes (Signed)
05/30/22 3:55 PM   Ashley Mcpherson 08/05/1975 [id number]  CC: Nephrolithiasis, OAB symptoms, stress incontinence  HPI: I saw Ashley Mcpherson today for the above issues.  She was referred for nephrolithiasis with multiple punctate nonobstructing stone seen on recent CT, however on my review of the chart these have been unchanged and stable since at least 2013, and she has never had a episode of stone passage or renal colic.  Her primary issue today is more urinary symptoms with urgency, urge incontinence, and stress incontinence.  She has never tried any medications for this.  She has a history of a hysterectomy.  She reports she tried Kegel exercises with no improvement in her stress incontinence.  She denies any dysuria or gross hematuria.  Recent urinalysis benign.   PMH: Past Medical History:  Diagnosis Date   GERD (gastroesophageal reflux disease)    Hypertension    IBS (irritable bowel syndrome)    Kidney stone     Surgical History: Past Surgical History:  Procedure Laterality Date   CRYOABLATION  2010   TUBAL LIGATION  2007   VAGINAL HYSTERECTOMY  2013   Dr. [facility]alia [facility])    Family History: Family History  Problem Relation Age of Onset   Cancer Mother        Pancreatic Cancer   Diabetes Mother    Hypertension Mother    Diabetes Father    Hypertension Father    Hypertension Maternal Grandmother    Diabetes Maternal Grandmother    Hypertension Maternal Grandfather    Hypertension Paternal Grandfather    Bladder Cancer Neg Hx    Kidney cancer Neg Hx     Social History:  reports that she has never smoked. She has never been exposed to tobacco smoke. She has never used smokeless tobacco. She reports that she does not drink alcohol and does not use drugs.  Physical Exam: BP 124/82   Pulse 81   Ht 5\' 4"  (1.626 m)   Wt 182 lb (82.6 kg)   BMI 31.24 kg/m    Constitutional:  Alert and oriented, No acute distress. Cardiovascular: No clubbing, cyanosis, or  edema. Respiratory: Normal respiratory effort, no increased work of breathing. GI: Abdomen is soft, nontender, nondistended, no abdominal masses  Pertinent Imaging: I have personally viewed and interpreted the CT showing multiple punctate nonobstructing renal stones, not significantly changed from CTs over the last 10+ years including 2013, 2017, 2020.  Assessment & Plan:   47 year old female with overactive bladder and urge incontinence and stress incontinence as well as bilateral nonobstructive nephrolithiasis with no prior episodes of renal colic or spontaneous stone passage.  We discussed that overactive bladder (OAB) is not a disease, but is a symptom complex that is generally not life-threatening.  Symptoms typically include urinary urgency, frequency, and urge incontinence.  There are numerous treatment options, however there are risks and benefits with both medical and surgical management.  First-line treatment is behavioral therapies including bladder training, pelvic floor muscle training, and fluid management.  Second line treatments include oral antimuscarinics(Ditropan er, Trospium) and beta-3 agonist (Mybetriq). There is typically a period of medication trial (4-8 weeks) to find the optimal therapy and dosing. If symptoms are bothersome despite the above management, third line options include intra-detrusor botox, peripheral tibial nerve stimulation (PTNS), and interstim (SNS). These are more invasive treatments with higher side effect profile, but may improve quality of life for patients with severe OAB symptoms.   -Trial of oxybutynin for overactive bladder -Referral placed  to GYN to consider pessary for stress incontinence/possible prolapse -Small nonobstructive renal stones unchanged over the last 10+ years, no intervention recommended   Nickolas Madrid, MD 05/30/2022  [facility] [address], [place], [place] [address] [phone]

## 2022-07-16 NOTE — Progress Notes (Deleted)
07/17/2022 10:35 AM   Ashley Mcpherson 1975/04/05 [id number]  Referring provider: Ronnald Ramp, MD [address] [place],  [place] [address]  Urological history: 1. Nephrolithiasis -multiple punctate nonobstructing stones - stable since 2013   2. Mixed incontinence -contributing factors of age, HTN, pelvic surgery, diuretics, depression, pre-diabetes and obesity     No chief complaint on file.   HPI: Ashley Mcpherson is a 47 y.o. female who presents today for 6 week follow up after a trial of oxybutynin XL 10 mg daily.  Previous records reviewed.   She has been referred to [facility] for pessary.   PVR ***  PMH: Past Medical History:  Diagnosis Date   GERD (gastroesophageal reflux disease)    Hypertension    IBS (irritable bowel syndrome)    Kidney stone     Surgical History: Past Surgical History:  Procedure Laterality Date   CRYOABLATION  2010   TUBAL LIGATION  2007   VAGINAL HYSTERECTOMY  2013   Dr. Logan Bores [facility])    Home Medications:  Allergies as of 07/17/2022       Reactions   Sulfa Antibiotics Anaphylaxis, Other (See Comments)   Mouth sores Gets sores in the mouth Other reaction(s): Stomatitis        Medication List        Accurate as of Jul 16, 2022 10:35 AM. If you have any questions, ask your nurse or doctor.          albuterol 108 (90 Base) MCG/ACT inhaler Commonly known as: VENTOLIN HFA Inhale 2 puffs into the lungs every 6 (six) hours as needed for wheezing or shortness of breath.   ibuprofen 600 MG tablet Commonly known as: ADVIL Take 1 tablet (600 mg total) by mouth every 8 (eight) hours as needed for moderate pain.   losartan-hydrochlorothiazide 50-12.5 MG tablet Commonly known as: HYZAAR Take 1 tablet by mouth daily.   ondansetron 4 MG disintegrating tablet Commonly known as: ZOFRAN-ODT Take 1 tablet (4 mg total) by mouth every 8 (eight) hours as needed for nausea or vomiting.   oxybutynin 10 MG  24 hr tablet Commonly known as: DITROPAN-XL Take 1 tablet (10 mg total) by mouth daily.   tamsulosin 0.4 MG Caps capsule Commonly known as: FLOMAX Take 1 capsule (0.4 mg total) by mouth daily.        Allergies:  Allergies  Allergen Reactions   Sulfa Antibiotics Anaphylaxis and Other (See Comments)    Mouth sores  Gets sores in the mouth  Other reaction(s): Stomatitis    Family History: Family History  Problem Relation Age of Onset   Cancer Mother        Pancreatic Cancer   Diabetes Mother    Hypertension Mother    Diabetes Father    Hypertension Father    Hypertension Maternal Grandmother    Diabetes Maternal Grandmother    Hypertension Maternal Grandfather    Hypertension Paternal Grandfather    Bladder Cancer Neg Hx    Kidney cancer Neg Hx     Social History:  reports that she has never smoked. She has never been exposed to tobacco smoke. She has never used smokeless tobacco. She reports that she does not drink alcohol and does not use drugs.  ROS: Pertinent ROS in HPI  Physical Exam: There were no vitals taken for this visit.  Constitutional:  Well nourished. Alert and oriented, No acute distress. HEENT: [place] AT, moist mucus membranes.  Trachea midline, no masses. Cardiovascular:  No clubbing, cyanosis, or edema. Respiratory: Normal respiratory effort, no increased work of breathing. GU: No CVA tenderness.  No bladder fullness or masses. Vulvovaginal atrophy w/ pallor, loss of rugae, introital retraction, excoriations.  Vulvar thinning, fusion of labia, clitoral hood retraction, prominent urethral meatus.   *** external genitalia, *** pubic hair distribution, no lesions.  Normal urethral meatus, no lesions, no prolapse, no discharge.   No urethral masses, tenderness and/or tenderness. No bladder fullness, tenderness or masses. *** vagina mucosa, *** estrogen effect, no discharge, no lesions, *** pelvic support, *** cystocele and *** rectocele noted.  No cervical  motion tenderness.  Uterus is freely mobile and non-fixed.  No adnexal/parametria masses or tenderness noted.  Anus and perineum are without rashes or lesions.   ***  Neurologic: Grossly intact, no focal deficits, moving all 4 extremities. Psychiatric: Normal mood and affect.    Laboratory Data: Lab Results  Component Value Date   WBC 8.4 04/12/2022   HGB 11.0 (L) 04/12/2022   HCT 35.9 (L) 04/12/2022   MCV 76.2 (L) 04/12/2022   PLT 294 04/12/2022    Lab Results  Component Value Date   CREATININE 0.90 05/10/2022  GFR 79   Lab Results  Component Value Date   AST 18 05/10/2022   Lab Results  Component Value Date   ALT 14 05/10/2022    Urinalysis    Component Value Date/Time   COLORURINE YELLOW (A) 04/12/2022 1826   APPEARANCEUR CLOUDY (A) 04/12/2022 1826   APPEARANCEUR Cloudy (A) 02/27/2019 1503   LABSPEC 1.021 04/12/2022 1826   LABSPEC 1.020 02/06/2012 1506   PHURINE 5.0 04/12/2022 1826   GLUCOSEU NEGATIVE 04/12/2022 1826   GLUCOSEU Negative 02/06/2012 1506   HGBUR NEGATIVE 04/12/2022 1826   BILIRUBINUR NEGATIVE 04/12/2022 1826   BILIRUBINUR negative 11/26/2021 1054   BILIRUBINUR Negative 02/27/2019 1503   BILIRUBINUR Negative 02/06/2012 1506   KETONESUR NEGATIVE 04/12/2022 1826   PROTEINUR NEGATIVE 04/12/2022 1826   UROBILINOGEN 0.2 11/26/2021 1054   NITRITE NEGATIVE 04/12/2022 1826   LEUKOCYTESUR NEGATIVE 04/12/2022 1826   LEUKOCYTESUR 3+ 02/06/2012 1506  I have reviewed the labs.   Pertinent Imaging: ***  Assessment & Plan:  ***  1. Mixed incontinence  - offered behavioral therapies  - bladder training  - bladder control strategies  - pelvic floor muscle training  - fluid management   - offered medical therapy with anticholinergic therapy or beta-3 adrenergic receptor agonist and the potential side effects of each therapy ***  - offered refer to gynecology for a pessary fitting ***  - offered an appointment with one of our surgeon for a possible  pelvic sling procedure ***  - would like to try the beta-3 adrenergic receptor agonist (Myrbetriq).  Given Myrbetriq 25 mg samples, #28.  I have reviewed with the patient of the side effects of Myrbetriq, such as: elevation in BP, urinary retention and/or HA.  She will return in one month for PVR and symptom recheck.  ***  - would like to try anticholinergic therapy.  Given Vesicare 5mg /10mg , Toviaz 4mg /8mg  samples, # 28.   Advised of the side effects, such as: Dry eyes, dry mouth, constipation, mental confusion and/or urinary retention. ***  - RTC in 3 weeks for PVR and symptom recheck ***      No follow-ups on file.  These notes generated with voice recognition software. I apologize for typographical errors.  Michiel Cowboy, PA-C  [facility]) [address]  [address] [place], [place] [address] [phone]  

## 2022-07-17 ENCOUNTER — Ambulatory Visit: Payer: Medicaid Other | Admitting: Urology

## 2022-07-17 DIAGNOSIS — N3946 Mixed incontinence: Secondary | ICD-10-CM

## 2022-07-25 ENCOUNTER — Ambulatory Visit: Payer: Medicaid Other | Admitting: Surgery

## 2022-09-04 ENCOUNTER — Ambulatory Visit: Payer: Self-pay

## 2022-09-04 NOTE — Telephone Encounter (Signed)
  Chief Complaint: Blurry vision, HA Symptoms: Vision becoming blurry over the past month Frequency: 1 month getting worse. Pertinent Negatives: Patient denies Jaw pain, one sided weakness Disposition: [] ED /[] Urgent Care (no appt availability in office) / [x] Appointment(In office/virtual)/ []  [facility]/ [] Home Care/ [] Refused Recommended Disposition /[]  Mobile Bus/ []  Follow-up with PCP Additional Notes: Pt reports that her vision has become blurry over the past months and it seems to be getting worse. Pt thinks it may be her BP, but has not checked it. Pt was out of HTN meds for awhile,but is now back on them. PT also states she has a HA.    Summary: Blurred vision   Blurry vision, due to BP  Best contact: [phone]         Reason for Disposition  [1] Blurred vision or visual changes AND [2] gradual onset (e.g., weeks, months)  Answer Assessment - Initial Assessment Questions 1. DESCRIPTION: "How has your vision changed?" (e.g., complete vision loss, blurred vision, double vision, floaters, etc.)     blurred 2. LOCATION: "One or both eyes?" If one, ask: "Which eye?"     both 4. ONSET: "When did this begin?" "Did it start suddenly or has this been gradual?"     1 month 5. PATTERN: "Does this come and go, or has it been constant since it started?"     Constant getting worse 6. PAIN: "Is there any pain in your eye(s)?"  (Scale 1-10; or mild, moderate, severe)   - NONE (0): No pain.   - MILD (1-3): Doesn't interfere with normal activities.   - MODERATE (4-7): Interferes with normal activities or awakens from sleep.    - SEVERE (8-10): Excruciating pain, unable to do any normal activities.     HA 7. CONTACTS-GLASSES: "Do you wear contacts or glasses?"     contact 8. CAUSE: "What do you think is causing this visual problem?"     BP? 9. OTHER SYMPTOMS: "Do you have any other symptoms?" (e.g., confusion, headache, arm or leg weakness, speech problems)      HA  Protocols used: Vision Loss or Change-A-AH

## 2022-09-07 ENCOUNTER — Ambulatory Visit: Payer: Medicaid Other | Admitting: Physician Assistant

## 2022-09-07 VITALS — BP 126/87 | HR 71 | Temp 98.0°F | Ht 64.0 in | Wt 178.0 lb

## 2022-09-07 DIAGNOSIS — R5383 Other fatigue: Secondary | ICD-10-CM | POA: Diagnosis not present

## 2022-09-07 DIAGNOSIS — G4489 Other headache syndrome: Secondary | ICD-10-CM

## 2022-09-07 DIAGNOSIS — I10 Essential (primary) hypertension: Secondary | ICD-10-CM | POA: Diagnosis not present

## 2022-09-07 DIAGNOSIS — F321 Major depressive disorder, single episode, moderate: Secondary | ICD-10-CM

## 2022-09-07 DIAGNOSIS — H539 Unspecified visual disturbance: Secondary | ICD-10-CM

## 2022-09-07 DIAGNOSIS — H538 Other visual disturbances: Secondary | ICD-10-CM

## 2022-09-07 DIAGNOSIS — J302 Other seasonal allergic rhinitis: Secondary | ICD-10-CM

## 2022-09-07 DIAGNOSIS — K581 Irritable bowel syndrome with constipation: Secondary | ICD-10-CM

## 2022-09-07 DIAGNOSIS — E669 Obesity, unspecified: Secondary | ICD-10-CM

## 2022-09-07 NOTE — Progress Notes (Signed)
Established patient visit  Patient: Ashley Mcpherson   DOB: 19-Feb-1976   47 y.o. Female  MRN: [id number] Visit Date: 09/07/2022  Today's healthcare provider: Debera Lat, PA-C   Chief Complaint  Patient presents with   Eye Problem    Sudden change in vision.  Blurred for about three weeks.  Patient concerned it may be DM due to family history.  Patient had routine eye exam ni March   Subjective     Discussed the use of AI scribe software for clinical note transcription with the patient, who gave verbal consent to proceed.  History of Present Illness   The patient, with a history of high blood pressure and IBS, presents with blurred vision, which she describes as an overall lack of clarity rather than double vision or floating spots. The blurred vision has been particularly noticeable when using a computer or trying to read questionnaires. The patient has been wearing contact lenses for over 20 years and does not use eye drops during the day.  The patient also reports a dull, achy headache that started the previous day. The pain is located behind the eyes and is rated as a 2-3 on a scale of 10. The headache is not severe enough to prevent daily activities but is described as aggravating.  The patient's IBS has been causing constipation, with her not having been able to pass stool for about two weeks. This has led to feelings of being weighed down and has been accompanied by back pain. The patient is currently taking Linzess and Miralax for the IBS, but these have not been effective.           09/07/2022    1:36 PM 05/10/2022    3:22 PM 04/18/2022    9:07 AM  Depression screen PHQ 2/9  Decreased Interest 0 1 1  Down, Depressed, Hopeless 0 1 1  PHQ - 2 Score 0 2 2  Altered sleeping 0 3 1  Tired, decreased energy 3 2 1   Change in appetite 0 1 1  Feeling bad or failure about yourself  0 0 0  Trouble concentrating 0 2 1  Moving slowly or fidgety/restless 0 0 0  Suicidal thoughts 0 0 0   PHQ-9 Score 3 10 6   Difficult doing work/chores Not difficult at all Somewhat difficult Not difficult at all    Medications: Outpatient Medications Prior to Visit  Medication Sig   albuterol (VENTOLIN HFA) 108 (90 Base) MCG/ACT inhaler Inhale 2 puffs into the lungs every 6 (six) hours as needed for wheezing or shortness of breath.   ibuprofen (ADVIL) 600 MG tablet Take 1 tablet (600 mg total) by mouth every 8 (eight) hours as needed for moderate pain.   losartan-hydrochlorothiazide (HYZAAR) 50-12.5 MG tablet Take 1 tablet by mouth daily.   ondansetron (ZOFRAN-ODT) 4 MG disintegrating tablet Take 1 tablet (4 mg total) by mouth every 8 (eight) hours as needed for nausea or vomiting.   oxybutynin (DITROPAN-XL) 10 MG 24 hr tablet Take 1 tablet (10 mg total) by mouth daily.   tamsulosin (FLOMAX) 0.4 MG CAPS capsule Take 1 capsule (0.4 mg total) by mouth daily.   No facility-administered medications prior to visit.    Review of Systems  All other systems reviewed and are negative.  Except see HPI      Objective    BP 126/87 (BP Location: Right Arm, Patient Position: Sitting, Cuff Size: Normal)   Pulse 71   Temp 98 F (36.7 C) (Oral)  Ht 5\' 4"  (1.626 m)   Wt 178 lb (80.7 kg)   SpO2 100%   BMI 30.55 kg/m    Physical Exam Vitals reviewed.  Constitutional:      General: She is not in acute distress.    Appearance: Normal appearance. She is well-developed. She is not diaphoretic.  HENT:     Head: Normocephalic and atraumatic.     Right Ear: Ear canal and external ear normal.     Left Ear: Ear canal and external ear normal.     Ears:     Comments: Fluid behind tympanic membranes    Nose: Congestion and rhinorrhea present.     Mouth/Throat:     Comments: Postnasal drainage Eyes:     General: No scleral icterus.    Conjunctiva/sclera: Conjunctivae normal.  Neck:     Thyroid: No thyromegaly.  Cardiovascular:     Rate and Rhythm: Normal rate and regular rhythm.     Pulses:  Normal pulses.     Heart sounds: Normal heart sounds. No murmur heard. Pulmonary:     Effort: Pulmonary effort is normal. No respiratory distress.     Breath sounds: Normal breath sounds. No wheezing, rhonchi or rales.  Musculoskeletal:     Cervical back: Neck supple.     Right lower leg: No edema.     Left lower leg: No edema.  Lymphadenopathy:     Cervical: No cervical adenopathy.  Skin:    General: Skin is warm and dry.     Findings: No rash.  Neurological:     Mental Status: She is alert and oriented to person, place, and time. Mental status is at baseline.     Cranial Nerves: No cranial nerve deficit.     Sensory: No sensory deficit.     Motor: No weakness.     Coordination: Coordination normal.     Gait: Gait normal.     Deep Tendon Reflexes: Reflexes normal.  Psychiatric:        Mood and Affect: Mood normal.        Behavior: Behavior normal.      No results found for any visits on 09/07/22.  Assessment & Plan       Hypertension: Chronic and unstable. family history of cardiovascular diseases. Patient currently on antihypertensive medication.  His first reading was 145/ systolic blood blood pressure Continue taking losartan-hydrochlorothiazide 50-12.5 Mg -Obtain home blood pressure cuff and monitor blood pressure twice daily for two weeks. -Adopt DASH/Mediterranean diet for cardiovascular health. Needs to follow-up with his primary in 2 weeks  Visual Changes: Blurred vision reported, possibly related to changes in visual acuity. Patient currently uses contact lenses.  Could be connected to his blood pressure elevation -Refer to ophthalmologist for further evaluation and possible adjustment of corrective lenses.  Other fatigue Obesity Metabolic Screening: Patient has a BMI in the obesity range and a family history of cardiovascular diseases. Last blood sugar check was normal four months ago. -Order labs including A1c, metabolic panel, and TSH. -Continue weight  management efforts.  Headache: New onset headache described as dull and achy, located behind the eyes. No associated nasal congestion, sneezing, or eye drainage reported. -Consider possible connection to seasonal allergies vs changes in visual acuity -Continue monitoring symptoms and follow up with primary care provider.  Irritable Bowel Syndrome (IBS): Predominantly constipation-predominant IBS. Current treatment includes Linzess and Miralax, but patient reports inadequate relief. -Encourage increased water intake and dietary fiber. -Consider Metamucil supplementation. -Recommend consultation with a gastroenterologist  specializing in IBS. -Provide patient with IBS diet information via MyChart.  Seasonal allergies - Avoidance measures discussed. - Use nasal saline rinses before nose sprays such as with Neilmed Sinus Rinse bottle.  Use distilled water.   - Use Flonase 2 sprays each nostril daily. Aim upward and outward. - Use Zyrtec OTC 10 mg daily.   Depression:    09/07/2022    1:36 PM 05/10/2022    3:22 PM 04/18/2022    9:07 AM  PHQ9 SCORE ONLY  PHQ-9 Total Score 3 10 6   Chronic and stable However patient agreed to see psychologist to improve depression management Referral to Psychology was placed  General Health Maintenance: -Consider counseling for potential depression/anxiety related to IBS. -Consider allergy testing due to suspected seasonal allergies. -Recommend use of eye drops for contact lens use. -Plan follow-up appointment with primary care provider in two weeks.     No follow-ups on file.  Follow-up with his primary in 2 weeks for blood pressure    The patient was advised to call back or seek an in-person evaluation if the symptoms worsen or if the condition fails to improve as anticipated.  I discussed the assessment and treatment plan with the patient. The patient was provided an opportunity to ask questions and all were answered. The patient agreed with the plan  and demonstrated an understanding of the instructions.  I, Debera Lat, PA-C have reviewed all documentation for this visit. The documentation on 09/07/22  for the exam, diagnosis, procedures, and orders are all accurate and complete.  Debera Lat, [facility], MMS [facility] [phone] (phone) [phone] (fax)  [facility]

## 2022-09-08 ENCOUNTER — Encounter: Payer: Self-pay | Admitting: Physician Assistant

## 2022-09-08 LAB — CBC WITH DIFFERENTIAL/PLATELET
Basophils Absolute: 0 10*3/uL (ref 0.0–0.2)
Basos: 0 %
EOS (ABSOLUTE): 0 10*3/uL (ref 0.0–0.4)
Eos: 0 %
Hematocrit: 34.9 % (ref 34.0–46.6)
Hemoglobin: 10.9 g/dL — ABNORMAL LOW (ref 11.1–15.9)
Immature Grans (Abs): 0 10*3/uL (ref 0.0–0.1)
Immature Granulocytes: 0 %
Lymphocytes Absolute: 2.4 10*3/uL (ref 0.7–3.1)
Lymphs: 32 %
MCH: 23.4 pg — ABNORMAL LOW (ref 26.6–33.0)
MCHC: 31.2 g/dL — ABNORMAL LOW (ref 31.5–35.7)
MCV: 75 fL — ABNORMAL LOW (ref 79–97)
Monocytes Absolute: 0.4 10*3/uL (ref 0.1–0.9)
Monocytes: 6 %
Neutrophils Absolute: 4.6 10*3/uL (ref 1.4–7.0)
Neutrophils: 62 %
Platelets: 354 10*3/uL (ref 150–450)
RBC: 4.66 x10E6/uL (ref 3.77–5.28)
RDW: 15.6 % — ABNORMAL HIGH (ref 11.7–15.4)
WBC: 7.5 10*3/uL (ref 3.4–10.8)

## 2022-09-08 LAB — COMPREHENSIVE METABOLIC PANEL
ALT: 12 IU/L (ref 0–32)
AST: 17 IU/L (ref 0–40)
Albumin: 4.6 g/dL (ref 3.9–4.9)
Alkaline Phosphatase: 53 IU/L (ref 44–121)
BUN/Creatinine Ratio: 11 (ref 9–23)
BUN: 10 mg/dL (ref 6–24)
Bilirubin Total: 0.7 mg/dL (ref 0.0–1.2)
CO2: 27 mmol/L (ref 20–29)
Calcium: 9.3 mg/dL (ref 8.7–10.2)
Chloride: 99 mmol/L (ref 96–106)
Creatinine, Ser: 0.92 mg/dL (ref 0.57–1.00)
Globulin, Total: 3.2 g/dL (ref 1.5–4.5)
Glucose: 90 mg/dL (ref 70–99)
Potassium: 3.3 mmol/L — ABNORMAL LOW (ref 3.5–5.2)
Sodium: 140 mmol/L (ref 134–144)
Total Protein: 7.8 g/dL (ref 6.0–8.5)
eGFR: 77 mL/min/{1.73_m2} (ref >59)

## 2022-09-08 LAB — LIPID PANEL
Chol/HDL Ratio: 4 ratio (ref 0.0–4.4)
Cholesterol, Total: 232 mg/dL — ABNORMAL HIGH (ref 100–199)
HDL: 58 mg/dL (ref >39)
LDL Chol Calc (NIH): 150 mg/dL — ABNORMAL HIGH (ref 0–99)
Triglycerides: 134 mg/dL (ref 0–149)
VLDL Cholesterol Cal: 24 mg/dL (ref 5–40)

## 2022-09-08 LAB — TSH: TSH: 0.697 u[IU]/mL (ref 0.450–4.500)

## 2022-09-08 LAB — HEMOGLOBIN A1C
Est. average glucose Bld gHb Est-mCnc: 111 mg/dL
Hgb A1c MFr Bld: 5.5 % (ref 4.8–5.6)

## 2022-09-11 ENCOUNTER — Encounter: Payer: Medicaid Other | Admitting: Family Medicine

## 2022-09-12 NOTE — Progress Notes (Unsigned)
Subjective:     Ashley Mcpherson is a 47 y.o. [id number] female who was referred by her PCP for evaluation of stress urinary incontinence. This has been present for several years. She leaks urine with coughing, standing, exercise. Patient describes the symptoms as urine leakage with coughing/heavy physical activity. Factors associated with symptoms include: none known. She does have a history of vaginal hysterectomy in 2013. Evaluation to date includes recently being seen by urology (secondarily to history of nephrolithiasis however has never had issues with stone passage, and also noted urge urinary symptoms at that visit).  Today patient denies any issues with urgency or OAB symptoms, notes that her main concern is the leakage with any physical exertion. Notes that she has tried Kegel exercises without improvement.  She does have a history of hypertension,, currently on antihypertensives which also includes a diuretic, which she knows most likely is not helpful to her symptoms.    OB History  Gravida Para Term Preterm AB Living  3 3 3     3   SAB IAB Ectopic Multiple Live Births               # Outcome Date GA Lbr Len/2nd Weight Sex Delivery Anes PTL Lv  3 Term      Vag-Spont     2 Term      Vag-Spont     1 Term      Vag-Spont        Past Medical History:  Diagnosis Date   GERD (gastroesophageal reflux disease)    Hypertension    IBS (irritable bowel syndrome)    Kidney stone     Family History  Problem Relation Age of Onset   Cancer Mother        Pancreatic Cancer   Diabetes Mother    Hypertension Mother    Diabetes Father    Hypertension Father    Hypertension Maternal Grandmother    Diabetes Maternal Grandmother    Hypertension Maternal Grandfather    Hypertension Paternal Grandfather    Bladder Cancer Neg Hx    Kidney cancer Neg Hx     Past Surgical History:  Procedure Laterality Date   CRYOABLATION  2010   TUBAL LIGATION  2007   VAGINAL HYSTERECTOMY  2013   Dr.  Logan Bores [facility])    Social History   Socioeconomic History   Marital status: Single    Spouse name: Not on file   Number of children: Not on file   Years of education: Not on file   Highest education level: Not on file  Occupational History   Not on file  Tobacco Use   Smoking status: Never    Passive exposure: Never   Smokeless tobacco: Never  Vaping Use   Vaping Use: Never used  Substance and Sexual Activity   Alcohol use: No   Drug use: No   Sexual activity: Yes    Birth control/protection: Surgical  Other Topics Concern   Not on file  Social History Narrative   Not on file   Social Determinants of Health   Financial Resource Strain: Low Risk  (04/13/2022)   Overall Financial Resource Strain (CARDIA)    Difficulty of Paying Living Expenses: Not very hard  Food Insecurity: No Food Insecurity (04/13/2022)   Hunger Vital Sign    Worried About Running Out of Food in the Last Year: Never true    Ran Out of Food in the Last Year: Never true  Transportation Needs:  No Transportation Needs (04/13/2022)   PRAPARE - Administrator, Civil Service (Medical): No    Lack of Transportation (Non-Medical): No  Physical Activity: Not on file  Stress: Not on file  Social Connections: Not on file  Intimate Partner Violence: Not At Risk (04/13/2022)   Humiliation, Afraid, Rape, and Kick questionnaire    Fear of Current or Ex-Partner: No    Emotionally Abused: No    Physically Abused: No    Sexually Abused: No    Current Outpatient Medications on File Prior to Visit  Medication Sig Dispense Refill   ibuprofen (ADVIL) 600 MG tablet Take 1 tablet (600 mg total) by mouth every 8 (eight) hours as needed for moderate pain. 30 tablet 0   losartan-hydrochlorothiazide (HYZAAR) 50-12.5 MG tablet Take 1 tablet by mouth daily. 90 tablet 0   ondansetron (ZOFRAN-ODT) 4 MG disintegrating tablet Take 1 tablet (4 mg total) by mouth every 8 (eight) hours as needed for nausea or vomiting. 20 tablet  0   No current facility-administered medications on file prior to visit.    Allergies  Allergen Reactions   Sulfa Antibiotics Anaphylaxis and Other (See Comments)    Mouth sores  Gets sores in the mouth  Other reaction(s): Stomatitis     Review of Systems Pertinent items noted in HPI and remainder of comprehensive ROS otherwise negative.    Objective:    General appearance: alert and no distress Abdomen: soft, non-tender; bowel sounds normal; no masses,  no organomegaly Pelvic: external genitalia normal, rectovaginal septum normal.  Normal urethra, no lesions, hypermobility of urethra present with Q-tip test. Vagina without discharge.  No cystocele present. Uterus and cervix surgically absent. Adnexae non-palpable, nontender bilaterally.  Extremities: extremities normal, atraumatic, no cyanosis or edema Neurologic: Grossly normal   Lab Review Urinalysis    Component Value Date/Time   COLORURINE YELLOW (A) 04/12/2022 1826   APPEARANCEUR CLOUDY (A) 04/12/2022 1826   LABSPEC 1.021 04/12/2022 1826   PHURINE 5.0 04/12/2022 1826   GLUCOSEU NEGATIVE 04/12/2022 1826   HGBUR NEGATIVE 04/12/2022 1826   BILIRUBINUR NEGATIVE 04/12/2022 1826   KETONESUR NEGATIVE 04/12/2022 1826   PROTEINUR NEGATIVE 04/12/2022 1826   NITRITE NEGATIVE 04/12/2022 1826   LEUKOCYTESUR NEGATIVE 04/12/2022 1826    Assessment:    Stress incontinence. Severity = mild, overall course: gradually worsening.   Plan:    The causes of incontinence and plan for evaluation and treatment were discussed. Appropriate educational materials were distributed. Reviewed treatment options with patient including OTC methods with vaginal inserts (I.e. Poise Impressa), vaginal pessary, referral to physical therapy, or surgical intervention with sling or botox injections.  Patient notes that she would like to hold off on surgical intervention if not absolutely necessary at this time.  Will try OTC option first.   Call  or return to clinic prn if these symptoms worsen or fail to improve as anticipated.     Hildred Laser, MD [facility]

## 2022-09-13 ENCOUNTER — Ambulatory Visit: Payer: Medicaid Other | Admitting: Obstetrics and Gynecology

## 2022-09-13 VITALS — BP 110/76 | HR 80 | Resp 16 | Ht 64.0 in | Wt 179.2 lb

## 2022-09-13 DIAGNOSIS — Z1231 Encounter for screening mammogram for malignant neoplasm of breast: Secondary | ICD-10-CM

## 2022-09-13 DIAGNOSIS — N393 Stress incontinence (female) (male): Secondary | ICD-10-CM

## 2022-09-13 NOTE — Patient Instructions (Signed)
Urinary Incontinence Urinary incontinence refers to a condition in which a person is unable to control where and when to pass urine. A person with this condition will urinate involuntarily. This means that the person urinates when he or she does not mean to. What are the causes? This condition may be caused by: Medicines. Infections. Constipation. Overactive bladder muscles. Weak bladder muscles. Weak pelvic floor muscles. These muscles provide support for the bladder, intestine, and, in women, the uterus. Enlarged prostate in men. The prostate is a gland near the bladder. When it gets too big, it can pinch the urethra. With the urethra blocked, the bladder can weaken and lose the ability to empty properly. Surgery. Emotional factors, such as anxiety, stress, or post-traumatic stress disorder (PTSD). Spinal cord injury, nerve injury, or other neurological conditions. Pelvic organ prolapse. This happens in women when organs move out of place and into the vagina. This movement can prevent the bladder and urethra from working properly. What increases the risk? The following factors may make you more likely to develop this condition: Age. The older you are, the higher the risk. Obesity. Being physically inactive. Pregnancy and childbirth. Menopause. Diseases that affect the nerves or spinal cord. Long-term, or chronic, coughing. This can increase pressure on the bladder and pelvic floor muscles. What are the signs or symptoms? Symptoms may vary depending on the type of urinary incontinence you have. They include: A sudden urge to urinate, and passing urine involuntarily before you can get to a bathroom (urge incontinence). Suddenly passing urine when doing activities that force urine to pass, such as coughing, laughing, exercising, or sneezing (stress incontinence). Needing to urinate often but urinating only a small amount, or constantly dribbling urine (overflow incontinence). Urinating  because you cannot get to the bathroom in time due to a physical disability, such as arthritis or injury, or due to a communication or thinking problem, such as Alzheimer's disease (functional incontinence). How is this diagnosed? This condition may be diagnosed based on: Your medical history. A physical exam. Tests, such as: Urine tests. X-rays of your kidney and bladder. Ultrasound. CT scan. Cystoscopy. In this procedure, a health care provider inserts a tube with a light and camera (cystoscope) through the urethra and into the bladder to check for problems. Urodynamic testing. These tests assess how well the bladder, urethra, and sphincter can store and release urine. There are different types of urodynamic tests, and they vary depending on what the test is measuring. To help diagnose your condition, your health care provider may recommend that you keep a log of when you urinate and how much you urinate. How is this treated? Treatment for this condition depends on the type of incontinence that you have and its cause. Treatment may include: Lifestyle changes, such as: Quitting smoking. Maintaining a healthy weight. Staying active. Try to get 150 minutes of moderate-intensity exercise every week. Ask your health care provider which activities are safe for you. Eating a healthy diet. Avoid high-fat foods, like fried foods. Avoid refined carbohydrates like white bread and white rice. Limit how much alcohol and caffeine you drink. Increase your fiber intake. Healthy sources of fiber include beans, whole grains, and fresh fruits and vegetables. Behavioral changes, such as: Pelvic floor muscle exercises. Bladder training, such as lengthening the amount of time between bathroom breaks, or using the bathroom at regular intervals. Using techniques to suppress bladder urges. This can include distraction techniques or controlled breathing exercises. Medicines, such as: Medicines to relax the  bladder   muscles and prevent bladder spasms. Medicines to help slow or prevent the growth of a man's prostate. Botox injections. These can help relax the bladder muscles. Treatments, such as: Using pulses of electricity to help change bladder reflexes (electrical nerve stimulation). For women, using a medical device to prevent urine leaks. This is a small, tampon-like, disposable device that is inserted into the urethra. Injecting collagen or carbon beads (bulking agents) into the urinary sphincter. These can help thicken tissue and close the bladder opening. Surgery. Follow these instructions at home: Lifestyle Limit alcohol and caffeine. These can fill your bladder quickly and irritate it. Keep yourself clean to help prevent odors and skin damage. Ask your health care provider about special skin creams and cleansers that can protect the skin from urine. Consider wearing pads or adult diapers. Make sure to change them regularly, and always change them right after experiencing incontinence. General instructions Take over-the-counter and prescription medicines only as told by your health care provider. Use the bathroom about every 3-4 hours, even if you do not feel the need to urinate. Try to empty your bladder completely every time. After urinating, wait a minute. Then try to urinate again. Make sure you are in a relaxed position while urinating. If your incontinence is caused by nerve problems, keep a log of the medicines you take and the times you go to the bathroom. Keep all follow-up visits. This is important. Where to find more information [employer] and Digestive [employer] Kidney Diseases: [employer] American Urology Association: www.urologyhealth.org Contact a health care provider if: You have pain that gets worse. Your incontinence gets worse. Get help right away if: You have a fever or chills. You are unable to urinate. You have redness in your groin area or  down your legs. Summary Urinary incontinence refers to a condition in which a person is unable to control where and when to pass urine. This condition may be caused by medicines, infection, weak bladder muscles, weak pelvic floor muscles, enlargement of the prostate (in men), or surgery. Factors such as older age, obesity, pregnancy and childbirth, menopause, neurological diseases, and chronic coughing may increase your risk for developing this condition. Types of urinary incontinence include urge incontinence, stress incontinence, overflow incontinence, and functional incontinence. This condition is usually treated first with lifestyle and behavioral changes, such as quitting smoking, eating a healthier diet, and doing regular pelvic floor exercises. Other treatment options include medicines, bulking agents, medical devices, electrical nerve stimulation, or surgery. This information is not intended to replace advice given to you by your health care provider. Make sure you discuss any questions you have with your health care provider. Document Revised: 10/03/2019 Document Reviewed: 10/03/2019 Elsevier Patient Education  2024 [employer].

## 2022-09-14 ENCOUNTER — Encounter: Payer: Self-pay | Admitting: Obstetrics and Gynecology

## 2022-09-19 DIAGNOSIS — F330 Major depressive disorder, recurrent, mild: Secondary | ICD-10-CM | POA: Insufficient documentation

## 2022-09-19 DIAGNOSIS — F411 Generalized anxiety disorder: Secondary | ICD-10-CM | POA: Insufficient documentation

## 2022-09-25 ENCOUNTER — Encounter: Payer: Medicaid Other | Admitting: Family Medicine

## 2022-09-25 NOTE — Progress Notes (Deleted)
Complete physical exam   Patient: Ashley Mcpherson   DOB: 1976-02-28   47 y.o. Female  MRN: [id number] Visit Date: 09/25/2022  Today's healthcare provider: Ronnald Ramp, MD   No chief complaint on file.  Subjective    Ashley Mcpherson is a 47 y.o. female who presents today for a complete physical exam.   She reports consuming a {diet types:17450} diet.   {Exercise:19826} She generally feels {well/fairly well/poorly:18703}.   She reports sleeping {well/fairly well/poorly:18703}.    She {does/does not:200015} have additional problems to discuss today.     Past Medical History:  Diagnosis Date   GERD (gastroesophageal reflux disease)    Hypertension    IBS (irritable bowel syndrome)    Kidney stone    Past Surgical History:  Procedure Laterality Date   CRYOABLATION  2010   TUBAL LIGATION  2007   VAGINAL HYSTERECTOMY  2013   Dr. Logan Bores [facility])   Social History   Socioeconomic History   Marital status: Single    Spouse name: Not on file   Number of children: Not on file   Years of education: Not on file   Highest education level: Not on file  Occupational History   Not on file  Tobacco Use   Smoking status: Never    Passive exposure: Never   Smokeless tobacco: Never  Vaping Use   Vaping status: Never Used  Substance and Sexual Activity   Alcohol use: No   Drug use: No   Sexual activity: Yes    Birth control/protection: Surgical  Other Topics Concern   Not on file  Social History Narrative   Not on file   Social Determinants of Health   Financial Resource Strain: Low Risk  (04/13/2022)   Overall Financial Resource Strain (CARDIA)    Difficulty of Paying Living Expenses: Not very hard  Food Insecurity: No Food Insecurity (04/13/2022)   Hunger Vital Sign    Worried About Running Out of Food in the Last Year: Never true    Ran Out of Food in the Last Year: Never true  Transportation Needs: No Transportation Needs (04/13/2022)   PRAPARE -  Administrator, Civil Service (Medical): No    Lack of Transportation (Non-Medical): No  Physical Activity: Not on file  Stress: Not on file  Social Connections: Not on file  Intimate Partner Violence: Not At Risk (04/13/2022)   Humiliation, Afraid, Rape, and Kick questionnaire    Fear of Current or Ex-Partner: No    Emotionally Abused: No    Physically Abused: No    Sexually Abused: No   Family Status  Relation Name Status   Mother  Alive   Father  Alive   MGM  (Not Specified)   MGF  (Not Specified)   PGF  (Not Specified)   Neg Hx  (Not Specified)  No partnership data on file   Family History  Problem Relation Age of Onset   Cancer Mother        Pancreatic Cancer   Diabetes Mother    Hypertension Mother    Diabetes Father    Hypertension Father    Hypertension Maternal Grandmother    Diabetes Maternal Grandmother    Hypertension Maternal Grandfather    Hypertension Paternal Grandfather    Bladder Cancer Neg Hx    Kidney cancer Neg Hx    Allergies  Allergen Reactions   Sulfa Antibiotics Anaphylaxis and Other (See Comments)    Mouth sores  Gets sores in the mouth  Other reaction(s): Stomatitis     Medications: Outpatient Medications Prior to Visit  Medication Sig   ibuprofen (ADVIL) 600 MG tablet Take 1 tablet (600 mg total) by mouth every 8 (eight) hours as needed for moderate pain.   losartan-hydrochlorothiazide (HYZAAR) 50-12.5 MG tablet Take 1 tablet by mouth daily.   ondansetron (ZOFRAN-ODT) 4 MG disintegrating tablet Take 1 tablet (4 mg total) by mouth every 8 (eight) hours as needed for nausea or vomiting.   No facility-administered medications prior to visit.    Review of Systems  {Insert previous labs (optional):23779}  {See past labs  Heme  Chem  Endocrine  Serology  Results Review (optional):1}  Objective    There were no vitals taken for this visit. {Insert last BP/Wt (optional):23777}  {See vitals history  (optional):1}   Physical Exam  ***  Last depression screening scores    09/07/2022    1:36 PM 05/10/2022    3:22 PM 04/18/2022    9:07 AM  PHQ 2/9 Scores  PHQ - 2 Score 0 2 2  PHQ- 9 Score 3 10 6     Last fall risk screening    09/07/2022    1:36 PM  Fall Risk   Falls in the past year? 0  Number falls in past yr: 0  Injury with Fall? 0    Last Audit-C alcohol use screening    05/10/2022    3:22 PM  Alcohol Use Disorder Test (AUDIT)  1. How often do you have a drink containing alcohol? 0  2. How many drinks containing alcohol do you have on a typical day when you are drinking? 0  3. How often do you have six or more drinks on one occasion? 0  AUDIT-C Score 0   A score of 3 or more in women, and 4 or more in men indicates increased risk for alcohol abuse, EXCEPT if all of the points are from question 1   No results found for any visits on 09/25/22.  Assessment & Plan    Routine Health Maintenance and Physical Exam  Immunization History  Administered Date(s) Administered   Influenza,inj,Quad PF,6+ Mos 03/18/2019   Moderna Sars-Covid-2 Vaccination 10/17/2019, 10/31/2019   PPD Test 12/03/2017, 02/25/2019, 03/18/2019   Tdap 02/25/2019    Health Maintenance  Topic Date Due   Hepatitis C Screening  Never done   Colonoscopy  Never done   COVID-19 Vaccine (3 - 2023-24 season) 11/11/2021   MAMMOGRAM  10/05/2022   INFLUENZA VACCINE  10/12/2022   DTaP/Tdap/Td (2 - Td or Tdap) 02/24/2029   HIV Screening  Completed   HPV VACCINES  Aged Out   PAP SMEAR-Modifier  Discontinued    Problem List Items Addressed This Visit   None Visit Diagnoses     Annual physical exam    -  Primary        No follow-ups on file.       Ronnald Ramp, MD  [facility] [phone] (phone) [phone] (fax)  [facility]

## 2022-09-28 ENCOUNTER — Ambulatory Visit (INDEPENDENT_AMBULATORY_CARE_PROVIDER_SITE_OTHER): Payer: Medicaid Other | Admitting: Family Medicine

## 2022-09-28 ENCOUNTER — Encounter: Payer: Self-pay | Admitting: Family Medicine

## 2022-09-28 VITALS — BP 113/81 | HR 76 | Ht 64.0 in | Wt 176.8 lb

## 2022-09-28 DIAGNOSIS — K439 Ventral hernia without obstruction or gangrene: Secondary | ICD-10-CM

## 2022-09-28 DIAGNOSIS — I10 Essential (primary) hypertension: Secondary | ICD-10-CM

## 2022-09-28 DIAGNOSIS — Z1211 Encounter for screening for malignant neoplasm of colon: Secondary | ICD-10-CM | POA: Insufficient documentation

## 2022-09-28 DIAGNOSIS — K5900 Constipation, unspecified: Secondary | ICD-10-CM

## 2022-09-28 DIAGNOSIS — K581 Irritable bowel syndrome with constipation: Secondary | ICD-10-CM

## 2022-09-28 DIAGNOSIS — E876 Hypokalemia: Secondary | ICD-10-CM

## 2022-09-28 DIAGNOSIS — Z0000 Encounter for general adult medical examination without abnormal findings: Secondary | ICD-10-CM | POA: Insufficient documentation

## 2022-09-28 NOTE — Patient Instructions (Addendum)
A referral has been placed on your behalf for gastroenterology. Our referral coordination team or the office you will be visiting will contact you within the next 2 weeks.  If you have not received a phone call within 10 business days please let us know so that we can check into this for you.    Please come by the lab between 8a and 11:30 a or 1P-4:30 PM to have your potassium levels checked before the end of July

## 2022-09-28 NOTE — Assessment & Plan Note (Signed)
Patient reports a hernia that is increasing in size but not currently causing pain. Patient is concerned about potential complications, including bowel obstruction. -Discussed potential complications of hernia, including bowel obstruction. -Patient opted to monitor the hernia and will inform the office if she decides to pursue surgical consultation.

## 2022-09-28 NOTE — Assessment & Plan Note (Signed)
Chronic conditions are stable  Patient was counseled on benefits of regular physical activity with goal of 150 minutes of moderate to vigurous intensity 4 days per week  Patient was counseled to consume well balanced diet of fruits, vegetables, limited saturated fats and limited sugary foods and beverages with emphasis on consuming 6-8 glasses of water daily  Patient declined additional screenings and vaccines today other than colonoscopy, she requests to have numbing medication if colon cancer screening requires IV

## 2022-09-28 NOTE — Assessment & Plan Note (Signed)
Patient reports chronic constipation, requiring use of Linzess, Miralax, and dietary changes. Constipation is severe enough to cause vomiting and back pain. Patient has a history of IBS. -Chronic -Refer to Gastroenterology for further evaluation and management. -Continue current regimen of Linzess, Miralax, and dietary changes until seen by Gastroenterology.

## 2022-09-28 NOTE — Assessment & Plan Note (Signed)
Controlled BP at goal Continue hyzaar 50-12.5mg  daily  Declined BMP today to recheck for hypokalemia

## 2022-09-28 NOTE — Assessment & Plan Note (Signed)
-  Declined additional COVID-19 vaccine doses. -Check potassium levels in the near future due to patient's self-supplementation.

## 2022-09-28 NOTE — Progress Notes (Signed)
Complete physical exam   Patient: Ashley Mcpherson   DOB: 06-04-1975   47 y.o. Female  MRN: [id number] Visit Date: 09/28/2022  Today's healthcare provider: Ronnald Ramp, MD   Chief Complaint  Patient presents with   Annual Exam    Patient reports consuming a general diet. States she dos not participate in exercises at this moment. She reports feeling and sleeping well with no other concerns. Patient declined hepatitis c screening   Subjective    Ashley Mcpherson is a 48 y.o. female who presents today for a complete physical exam and also concerns for hernia and severe constipation with IBS     Past Medical History:  Diagnosis Date   GERD (gastroesophageal reflux disease)    Hypertension    IBS (irritable bowel syndrome)    Kidney stone    Past Surgical History:  Procedure Laterality Date   CRYOABLATION  2010   TUBAL LIGATION  2007   VAGINAL HYSTERECTOMY  2013   Dr. Logan Bores [facility])   Social History   Socioeconomic History   Marital status: Single    Spouse name: Not on file   Number of children: Not on file   Years of education: Not on file   Highest education level: Not on file  Occupational History   Not on file  Tobacco Use   Smoking status: Never    Passive exposure: Never   Smokeless tobacco: Never  Vaping Use   Vaping status: Never Used  Substance and Sexual Activity   Alcohol use: No   Drug use: No   Sexual activity: Yes    Birth control/protection: Surgical  Other Topics Concern   Not on file  Social History Narrative   Not on file   Social Determinants of Health   Financial Resource Strain: Low Risk  (04/13/2022)   Overall Financial Resource Strain (CARDIA)    Difficulty of Paying Living Expenses: Not very hard  Food Insecurity: No Food Insecurity (04/13/2022)   Hunger Vital Sign    Worried About Running Out of Food in the Last Year: Never true    Ran Out of Food in the Last Year: Never true  Transportation Needs: No Transportation  Needs (04/13/2022)   PRAPARE - Administrator, Civil Service (Medical): No    Lack of Transportation (Non-Medical): No  Physical Activity: Not on file  Stress: Not on file  Social Connections: Not on file  Intimate Partner Violence: Not At Risk (04/13/2022)   Humiliation, Afraid, Rape, and Kick questionnaire    Fear of Current or Ex-Partner: No    Emotionally Abused: No    Physically Abused: No    Sexually Abused: No   Family Status  Relation Name Status   Mother  Alive   Father  Alive   MGM  (Not Specified)   MGF  (Not Specified)   PGF  (Not Specified)   Neg Hx  (Not Specified)  No partnership data on file   Family History  Problem Relation Age of Onset   Cancer Mother        Pancreatic Cancer   Diabetes Mother    Hypertension Mother    Diabetes Father    Hypertension Father    Hypertension Maternal Grandmother    Diabetes Maternal Grandmother    Hypertension Maternal Grandfather    Hypertension Paternal Grandfather    Bladder Cancer Neg Hx    Kidney cancer Neg Hx    Allergies  Allergen Reactions  Sulfa Antibiotics Anaphylaxis and Other (See Comments)    Mouth sores  Gets sores in the mouth  Other reaction(s): Stomatitis     Medications: Outpatient Medications Prior to Visit  Medication Sig   ibuprofen (ADVIL) 600 MG tablet Take 1 tablet (600 mg total) by mouth every 8 (eight) hours as needed for moderate pain.   losartan-hydrochlorothiazide (HYZAAR) 50-12.5 MG tablet Take 1 tablet by mouth daily.   ondansetron (ZOFRAN-ODT) 4 MG disintegrating tablet Take 1 tablet (4 mg total) by mouth every 8 (eight) hours as needed for nausea or vomiting.   No facility-administered medications prior to visit.    Review of Systems      Objective    BP 113/81 (BP Location: Right Arm, Patient Position: Sitting, Cuff Size: Normal)   Pulse 76   Ht 5\' 4"  (1.626 m)   Wt 176 lb 12.8 oz (80.2 kg)   SpO2 100%   BMI 30.35 kg/m       Physical Exam Vitals  reviewed.  Constitutional:      General: She is not in acute distress.    Appearance: Normal appearance. She is not ill-appearing, toxic-appearing or diaphoretic.  HENT:     Head: Normocephalic and atraumatic.     Right Ear: Tympanic membrane and external ear normal. There is no impacted cerumen.     Left Ear: Tympanic membrane and external ear normal. There is no impacted cerumen.     Nose: Nose normal.     Mouth/Throat:     Pharynx: Oropharynx is clear.  Eyes:     General: No scleral icterus.    Extraocular Movements: Extraocular movements intact.     Conjunctiva/sclera: Conjunctivae normal.     Pupils: Pupils are equal, round, and reactive to light.  Cardiovascular:     Rate and Rhythm: Normal rate and regular rhythm.     Pulses: Normal pulses.     Heart sounds: Normal heart sounds. No murmur heard.    No friction rub. No gallop.  Pulmonary:     Effort: Pulmonary effort is normal. No respiratory distress.     Breath sounds: Normal breath sounds. No wheezing, rhonchi or rales.  Abdominal:     General: Bowel sounds are normal. There is no distension.     Palpations: Abdomen is soft. There is no mass.     Tenderness: There is no abdominal tenderness. There is no guarding.     Hernia: A hernia is present. Hernia is present in the ventral area.  Musculoskeletal:        General: No deformity.     Cervical back: Normal range of motion and neck supple. No rigidity.     Right lower leg: No edema.     Left lower leg: No edema.  Lymphadenopathy:     Cervical: No cervical adenopathy.  Skin:    General: Skin is warm.     Capillary Refill: Capillary refill takes less than 2 seconds.     Findings: No erythema or rash.  Neurological:     General: No focal deficit present.     Mental Status: She is alert and oriented to person, place, and time.     Motor: No weakness.     Gait: Gait normal.  Psychiatric:        Mood and Affect: Mood normal.        Behavior: Behavior normal.        Last depression screening scores    09/07/2022    1:36 PM 05/10/2022  3:22 PM 04/18/2022    9:07 AM  PHQ 2/9 Scores  PHQ - 2 Score 0 2 2  PHQ- 9 Score 3 10 6     Last fall risk screening    09/07/2022    1:36 PM  Fall Risk   Falls in the past year? 0  Number falls in past yr: 0  Injury with Fall? 0    Last Audit-C alcohol use screening    05/10/2022    3:22 PM  Alcohol Use Disorder Test (AUDIT)  1. How often do you have a drink containing alcohol? 0  2. How many drinks containing alcohol do you have on a typical day when you are drinking? 0  3. How often do you have six or more drinks on one occasion? 0  AUDIT-C Score 0   A score of 3 or more in women, and 4 or more in men indicates increased risk for alcohol abuse, EXCEPT if all of the points are from question 1   No results found for any visits on 09/28/22.  Assessment & Plan    Routine Health Maintenance and Physical Exam  Immunization History  Administered Date(s) Administered   Influenza,inj,Quad PF,6+ Mos 03/18/2019   Moderna Sars-Covid-2 Vaccination 10/17/2019, 10/31/2019   PPD Test 12/03/2017, 02/25/2019, 03/18/2019   Tdap 02/25/2019    Health Maintenance  Topic Date Due   Hepatitis C Screening  Never done   Colonoscopy  Never done   COVID-19 Vaccine (3 - 2023-24 season) 11/11/2021   MAMMOGRAM  10/05/2022   INFLUENZA VACCINE  10/12/2022   DTaP/Tdap/Td (2 - Td or Tdap) 02/24/2029   HIV Screening  Completed   HPV VACCINES  Aged Out   PAP SMEAR-Modifier  Discontinued    Problem List Items Addressed This Visit     Annual physical exam - Primary    Chronic conditions are stable  Patient was counseled on benefits of regular physical activity with goal of 150 minutes of moderate to vigurous intensity 4 days per week  Patient was counseled to consume well balanced diet of fruits, vegetables, limited saturated fats and limited sugary foods and beverages with emphasis on consuming 6-8 glasses of water  daily  Patient declined additional screenings and vaccines today other than colonoscopy, she requests to have numbing medication if colon cancer screening requires IV        Essential hypertension    Controlled BP at goal Continue hyzaar 50-12.5mg  daily  Declined BMP today to recheck for hypokalemia        Healthcare maintenance    -Declined additional COVID-19 vaccine doses. -Check potassium levels in the near future due to patient's self-supplementation.          Irritable bowel syndrome with constipation    Patient reports chronic constipation, requiring use of Linzess, Miralax, and dietary changes. Constipation is severe enough to cause vomiting and back pain. Patient has a history of IBS. -Chronic -Refer to Gastroenterology for further evaluation and management. -Continue current regimen of Linzess, Miralax, and dietary changes until seen by Gastroenterology.      Ventral hernia without obstruction or gangrene    Patient reports a hernia that is increasing in size but not currently causing pain. Patient is concerned about potential complications, including bowel obstruction. -Discussed potential complications of hernia, including bowel obstruction. -Patient opted to monitor the hernia and will inform the office if she decides to pursue surgical consultation.      Other Visit Diagnoses     Screening for colon  cancer       Relevant Orders   Ambulatory referral to Gastroenterology   Constipation in female       Hypokalemia       Relevant Orders   Basic Metabolic Panel (BMET)      PATIENT REQUESTS NUMBING MEDICATION PRIOR TO IV INSERTION DUE TO SEVERE FEAR OF IV       Severe Anxiety with IVs: Patient has severe anxiety with IVs to the point of leaving a hospital before a procedure. Discussed the possibility of premedication for anxiety prior to procedures requiring IVs. -Will inquire about premedication for IV-related anxiety and include this in the referral  note.    Return in about 4 months (around 01/29/2023) for HTN.       Ronnald Ramp, MD  [facility] [phone] (phone) [phone] (fax)  [facility]

## 2022-10-02 ENCOUNTER — Ambulatory Visit
Admission: EM | Admit: 2022-10-02 | Discharge: 2022-10-02 | Disposition: A | Discharge status: Home / Self Care | Payer: Medicaid Other | Attending: Emergency Medicine | Admitting: Emergency Medicine

## 2022-10-02 DIAGNOSIS — R300 Dysuria: Secondary | ICD-10-CM

## 2022-10-02 DIAGNOSIS — R350 Frequency of micturition: Secondary | ICD-10-CM | POA: Diagnosis not present

## 2022-10-02 DIAGNOSIS — R197 Diarrhea, unspecified: Secondary | ICD-10-CM | POA: Diagnosis not present

## 2022-10-02 LAB — POCT URINALYSIS DIP (MANUAL ENTRY)
Bilirubin, UA: NEGATIVE
Glucose, UA: NEGATIVE mg/dL
Leukocytes, UA: NEGATIVE
Nitrite, UA: NEGATIVE
Protein Ur, POC: NEGATIVE mg/dL
Spec Grav, UA: 1.025 (ref 1.010–1.025)
Urobilinogen, UA: 0.2 E.U./dL
pH, UA: 5.5 (ref 5.0–8.0)

## 2022-10-02 NOTE — Discharge Instructions (Addendum)
Follow the diarrhea diet as tolerated.   Go to the emergency department if you have worsening symptoms.    Follow up with your primary care provider.

## 2022-10-02 NOTE — ED Provider Notes (Signed)
[name]    CSN: [id number] Arrival date & time: 10/02/22  1336      History   Chief Complaint Chief Complaint  Patient presents with   Urinary Frequency    HPI KATLIN BORTNER is a 47 y.o. female.  Patient presents with 2-day history of urinary frequency, dysuria, diarrhea.  1 episode of diarrhea today.  She denies fever, abdominal pain, flank pain, vaginal discharge, pelvic pain, or other symptoms.  No OTC medications taken today.  Her medical history includes irritable bowel syndrome, hypertension, kidney stones, GERD.  The history is provided by the patient and medical records.    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   Prediabetes 09/08/2021   History of nephrolithiasis 09/07/2020   Hyperlipidemia, mixed 09/07/2020   Insomnia 09/07/2020   Vitamin D deficiency 09/07/2020   Acute upper respiratory infection 04/02/2020   Shortness of breath 04/02/2020   Encounter for school history and physical examination 03/02/2019   High measles virus antibody titer 03/02/2019   Irritable bowel syndrome with constipation 08/13/2018   Flank pain 06/02/2018   Renal calculi 06/02/2018   Encounter for general adult medical examination with abnormal findings 11/28/2017   Post traumatic stress disorder 11/28/2017   Urinary tract infection with hematuria 08/12/2017   Dysuria 08/12/2017   Chronic bilateral low back pain  08/12/2017   Essential hypertension 04/17/2017   H/O: hysterectomy 03/14/2011    Past Surgical History:  Procedure Laterality Date   CRYOABLATION  2010   TUBAL LIGATION  2007   VAGINAL HYSTERECTOMY  2013   Dr. Logan Bores [facility])    OB History     Gravida  3   Para  3   Term  3   Preterm      AB      Living  3      SAB      IAB      Ectopic      Multiple      Live Births               Home Medications    Prior to Admission medications   Medication Sig Start Date End Date Taking? Authorizing Provider  amitriptyline (ELAVIL) 25 MG tablet Take 25 mg by mouth at bedtime. 09/19/22  Yes [provider]  ibuprofen (ADVIL) 600 MG tablet Take 1 tablet (600 mg total) by mouth every 8 (eight) hours as needed for moderate pain. 04/18/22   Simmons-Robinson, Makiera, MD  losartan-hydrochlorothiazide (HYZAAR) 50-12.5 MG tablet Take 1 tablet by mouth daily. 05/28/21   Bing Neighbors, NP  ondansetron (ZOFRAN-ODT) 4 MG disintegrating tablet Take 1 tablet (4 mg total) by mouth every 8 (eight) hours as needed for nausea or vomiting. 04/12/22   Varney Daily, PA    Family History Family History  Problem Relation Age of Onset   Cancer Mother        Pancreatic Cancer   Diabetes  Mother    Hypertension Mother    Diabetes Father    Hypertension Father    Hypertension Maternal Grandmother    Diabetes Maternal Grandmother    Hypertension Maternal Grandfather    Hypertension Paternal Grandfather    Bladder Cancer Neg Hx    Kidney cancer Neg Hx     Social History Social History   Tobacco Use   Smoking status: Never    Passive exposure: Never   Smokeless tobacco: Never  Vaping Use   Vaping status: Never Used  Substance Use Topics   Alcohol use: No   Drug use: No     Allergies   Sulfa antibiotics   Review of Systems Review of Systems  Constitutional:  Negative for chills and fever.  Gastrointestinal:  Positive for diarrhea. Negative for abdominal  pain, nausea and vomiting.  Genitourinary:  Positive for dysuria and frequency. Negative for flank pain, hematuria, pelvic pain and vaginal discharge.  All other systems reviewed and are negative.    Physical Exam Triage Vital Signs ED Triage Vitals [10/02/22 1428]  Encounter Vitals Group     BP 112/75     Systolic BP Percentile      Diastolic BP Percentile      Pulse Rate 81     Resp 18     Temp 98.3 F (36.8 C)     Temp src      SpO2 100 %     Weight      Height      Head Circumference      Peak Flow      Pain Score      Pain Loc      Pain Education      Exclude from Growth Chart    No data found.  Updated Vital Signs BP 112/75   Pulse 81   Temp 98.3 F (36.8 C)   Resp 18   SpO2 100%   Visual Acuity Right Eye Distance:   Left Eye Distance:   Bilateral Distance:    Right Eye Near:   Left Eye Near:    Bilateral Near:     Physical Exam Vitals and nursing note reviewed.  Constitutional:      General: She is not in acute distress.    Appearance: She is well-developed.  HENT:     Mouth/Throat:     Mouth: Mucous membranes are moist.  Cardiovascular:     Rate and Rhythm: Normal rate and regular rhythm.     Heart sounds: Normal heart sounds.  Pulmonary:     Effort: Pulmonary effort is normal. No respiratory distress.     Breath sounds: Normal breath sounds.  Abdominal:     General: Bowel sounds are normal.     Palpations: Abdomen is soft.     Tenderness: There is no abdominal tenderness. There is no right CVA tenderness, left CVA tenderness, guarding or rebound.  Musculoskeletal:     Cervical back: Neck supple.  Skin:    General: Skin is warm and dry.  Neurological:     Mental Status: She is alert.  Psychiatric:        Mood and Affect: Mood normal.        Behavior: Behavior normal.      UC Treatments / Results  Labs (all labs ordered are listed, but only abnormal results are displayed) Labs Reviewed  POCT URINALYSIS DIP (MANUAL ENTRY) -  Abnormal; Notable for the following components:      Result Value  Ketones, POC UA trace (5) (*)    Blood, UA small (*)    All other components within normal limits    EKG   Radiology No results found.  Procedures Procedures (including critical care time)  Medications Ordered in UC Medications - No data to display  Initial Impression / Assessment and Plan / UC Course  I have reviewed the triage vital signs and the nursing notes.  Pertinent labs & imaging results that were available during my care of the patient were reviewed by me and considered in my medical decision making (see chart for details).    Urinary frequency, dysuria, diarrhea.  Vital signs are stable.  Abdomen is soft and nontender with good bowel sounds.  Urine does not show signs of infection.  Discussed symptomatic treatment including hydration and diarrhea diet.  Education provided on urinary frequency, dysuria, diarrhea.  Instructed patient to follow up with her PCP if her symptoms are not improving.  ED precautions given.  She agrees to plan of care.   Final Clinical Impressions(s) / UC Diagnoses   Final diagnoses:  Urinary frequency  Diarrhea, unspecified type  Dysuria     Discharge Instructions      Follow the diarrhea diet as tolerated.   Go to the emergency department if you have worsening symptoms.    Follow up with your primary care provider.          ED Prescriptions   None    PDMP not reviewed this encounter.   Mickie Bail, NP 10/02/22 [phone]

## 2022-10-02 NOTE — ED Triage Notes (Addendum)
Patient to Urgent Care with complaints of urinary frequency and dysuria that started two days ago. Reports fatigue and nausea. Denies any known fevers. Diarrhea that started yesterday.  Used an at home UTI test that was negative.

## 2022-10-04 NOTE — Progress Notes (Signed)
[name], PA-C [address]  [address]  [place], [place] [address]  Main: [phone]  Fax: [phone]   Gastroenterology Consultation  Referring Provider:     Brett Albino* Primary Care Physician:  Ronnald Ramp, MD Primary Gastroenterologist:  [name], PA-C  Reason for Consultation:     Severe constipation; Multiple Chronic GI symptoms        HPI:   Ashley Mcpherson is a 47 y.o. y/o female referred for consultation & management  by Ronnald Ramp, MD.    Patient has history of irritable bowel syndrome with chronic constipation for many years.  She has been on Linzess 290 since 2017.  Also takes MiraLAX as needed.  Patient states Linzess is no longer working.  She has bowel movement once every 2 or 3 weeks.  She has RLQ pain when she is constipated.  She denies rectal bleeding or unintentional weight loss.  No family history of colon cancer.  No previous colonoscopy.  She was diagnosed with IBS many years ago when she was in the [employer].  Has been to the [place] in [place].  She reports having external hemorrhoids and has questions about treatment.  In March 2024 she saw Dr. Claudine Mouton, general surgeon for chronic lump in the epigastrium and ventral hernia.  Chronic nausea.  She discussed elective hernia repair with Dr. Claudine Mouton.  Abdominal pelvic CT 03/2022 showed multiple bilateral nonobstructing renal calculi up to 6 mm.  Otherwise negative CT.  Abdominal ultrasound 05/2022 showed fat-containing ventral abdominal wall hernia with no inflammation.  Patient admits to heartburn, dysphagia, belching, abdominal bloating, and chronic nausea.  No previous EGD.  She is currently taking omeprazole 40 Mg once daily with fair control of acid reflux.  Labs 09/07/2022 showed stable chronic microcytic anemia with hemoglobin 10.9, MCV 75, platelet 354.  She has history of chronic anemia and thalassemia trait.  CMP normal except potassium 3.3.  Last iron  panel from 2021 was normal.  B12 and folate normal.  She is currently taking potassium supplement.  Past Medical History:  Diagnosis Date   GERD (gastroesophageal reflux disease)    Hypertension    IBS (irritable bowel syndrome)    Kidney stone     Past Surgical History:  Procedure Laterality Date   CRYOABLATION  2010   TUBAL LIGATION  2007   VAGINAL HYSTERECTOMY  2013   Dr. Logan Bores [facility])    Prior to Admission medications   Medication Sig Start Date End Date Taking? Authorizing Provider  amitriptyline (ELAVIL) 25 MG tablet Take 25 mg by mouth at bedtime. 09/19/22   [provider]  ibuprofen (ADVIL) 600 MG tablet Take 1 tablet (600 mg total) by mouth every 8 (eight) hours as needed for moderate pain. 04/18/22   Simmons-Robinson, Makiera, MD  losartan-hydrochlorothiazide (HYZAAR) 50-12.5 MG tablet Take 1 tablet by mouth daily. 05/28/21   Bing Neighbors, NP  ondansetron (ZOFRAN-ODT) 4 MG disintegrating tablet Take 1 tablet (4 mg total) by mouth every 8 (eight) hours as needed for nausea or vomiting. 04/12/22   Varney Daily, PA    Family History  Problem Relation Age of Onset   Cancer Mother        Pancreatic Cancer   Diabetes Mother    Hypertension Mother    Diabetes Father    Hypertension Father    Hypertension Maternal Grandmother    Diabetes Maternal Grandmother    Hypertension Maternal Grandfather    Hypertension Paternal Grandfather  Bladder Cancer Neg Hx    Kidney cancer Neg Hx      Social History   Tobacco Use   Smoking status: Never    Passive exposure: Never   Smokeless tobacco: Never  Vaping Use   Vaping status: Never Used  Substance Use Topics   Alcohol use: No   Drug use: No    Allergies as of 10/05/2022 - Review Complete 10/05/2022  Allergen Reaction Noted   Sulfa antibiotics Anaphylaxis and Other (See Comments) 04/18/2017    Review of Systems:    All systems reviewed and negative except where noted in HPI.   Physical Exam:   BP 104/72   Pulse 67   Temp 98.2 F (36.8 C)   Ht 5\' 4"  (1.626 m)   Wt 177 lb 12.8 oz (80.6 kg)   BMI 30.52 kg/m  No LMP recorded. Patient has had a hysterectomy. Psych:  Alert and cooperative. Normal mood and affect. General:   Alert,  Well-developed, well-nourished, pleasant and cooperative in NAD Head:  Normocephalic and atraumatic. Eyes:  Sclera clear, no icterus.   Conjunctiva pink. Neck:  Supple; no masses or thyromegaly. Lungs:  Respirations even and unlabored.  Clear throughout to auscultation.   No wheezes, crackles, or rhonchi. No acute distress. Heart:  Regular rate and rhythm; no murmurs, clicks, rubs, or gallops. Abdomen:  Normal bowel sounds.  No bruits.  Soft, and non-distended without masses, hepatosplenomegaly or hernias noted.  No Tenderness.  No guarding or rebound tenderness.    Neurologic:  Alert and oriented x3;  grossly normal neurologically. Psych:  Alert and cooperative. Normal mood and affect.  Imaging Studies: No results found.  Assessment and Plan:   Ashley Mcpherson is a 47 y.o. y/o female has been referred for:  1.  Chronic constipation  Continue MiraLAX  Stop Linzess .  Start Ibsrella 50mg  BID - Samples Given.  If samples work well, call back for prescription.  2.  Irritable bowel syndrome, constipation predominant  3.  Microcytic anemia, chronic, history of thalassemia trait  Labs: CBC, iron panel, ferritin, B12, folate, and celiac lab.   4.  Nausea with History of GERD  Scheduling EGD I discussed risks of EGD with patient to include risk of bleeding, perforation, and risk of sedation.  Patient expressed understanding and agrees to proceed with EGD.   Continue Omeprazole 40mg  1 tab daily.  5.  Colon cancer screening  Scheduling Colonoscopy I discussed risks of colonoscopy with patient to include risk of bleeding, colon perforation, and risk of sedation.  Patient expressed understanding and agrees to proceed with colonoscopy.   6.   Ventral hernia  Continue to follow-up with Dr. Claudine Mouton, general surgeon.  7.  External hemorrhoids  Discussed treatment for hemorrhoids at length.  For External Hemorrhoids: Warm water sitz bath with epsom salt for flare up of external hemorrhoids. Use OTC Preparation H, Tucks Pads, and Witch Hazel wipes as needed. Prescription hydrocortisone 2.5% cream apply twice daily for 2 weeks. Discussed referral for Surgery as a last resort if Conservative treatment fails.  For Internal Hemorrhoids: Stressed importance of treating underlying constipation. Avoid Sitting on the toilet for prolonged amount of time. Discussed Internal Hemorrhoid Banding if no improvement with conservative treament.   Follow up in 3 months.  [name], PA-C

## 2022-10-05 ENCOUNTER — Ambulatory Visit: Payer: Medicaid Other | Admitting: Physician Assistant

## 2022-10-05 ENCOUNTER — Encounter: Payer: Self-pay | Admitting: Physician Assistant

## 2022-10-05 VITALS — BP 104/72 | HR 67 | Temp 98.2°F | Ht 64.0 in | Wt 177.8 lb

## 2022-10-05 DIAGNOSIS — K581 Irritable bowel syndrome with constipation: Secondary | ICD-10-CM | POA: Diagnosis not present

## 2022-10-05 DIAGNOSIS — D649 Anemia, unspecified: Secondary | ICD-10-CM

## 2022-10-05 DIAGNOSIS — K219 Gastro-esophageal reflux disease without esophagitis: Secondary | ICD-10-CM

## 2022-10-05 DIAGNOSIS — R110 Nausea: Secondary | ICD-10-CM | POA: Diagnosis not present

## 2022-10-05 DIAGNOSIS — Z1211 Encounter for screening for malignant neoplasm of colon: Secondary | ICD-10-CM

## 2022-10-05 DIAGNOSIS — K5904 Chronic idiopathic constipation: Secondary | ICD-10-CM

## 2022-10-05 DIAGNOSIS — K649 Unspecified hemorrhoids: Secondary | ICD-10-CM

## 2022-10-05 DIAGNOSIS — E876 Hypokalemia: Secondary | ICD-10-CM

## 2022-10-05 MED ORDER — HYDROCORTISONE (PERIANAL) 2.5 % EX CREA
1.0000 | TOPICAL_CREAM | Freq: Two times a day (BID) | CUTANEOUS | 1 refills | Status: DC
Start: 2022-10-05 — End: 2022-11-23

## 2022-10-05 MED ORDER — PEG 3350-KCL-NA BICARB-NACL 420 G PO SOLR: 4000.0000 mL | Freq: Once | ORAL | 0 refills | Status: AC

## 2022-10-05 NOTE — Patient Instructions (Signed)
For Constipation: Stop Linzess. Try Samples of Ibsrella 50mg  1 tablet twice daily. Call back for Rx for Ibsrella if it works well. Continue Miralax daily. If Allena Napoleon does not work well, then you can go back on Linzess 290.

## 2022-10-10 ENCOUNTER — Telehealth: Payer: Self-pay

## 2022-10-10 NOTE — Telephone Encounter (Signed)
Patient notified.  Celiac labs are negative.  No evidence of gluten allergy.

## 2022-10-10 NOTE — Progress Notes (Signed)
Celiac labs are negative.  No evidence of gluten allergy.

## 2022-10-10 NOTE — Telephone Encounter (Signed)
Patient notified.    Notify patient labs showed:  1.  Stable chronic anemia with hemoglobin 10.2.  She has history of thalassemia trait which would explain her anemia.  2.  Normal iron levels.  No evidence of iron deficiency or iron overload.  3.  Vitamin B12 and folate are normal.  4.  Normal kidney function and electrolytes.  Drink 64 ounces of water daily to prevent dehydration.  5.  Celiac labs are pending.  6.  Continue with plan for EGD and colonoscopy as scheduled.

## 2022-10-10 NOTE — Progress Notes (Signed)
Notify patient labs showed: 1.  Stable chronic anemia with hemoglobin 10.2.  She has history of thalassemia trait which would explain her anemia. 2.  Normal iron levels.  No evidence of iron deficiency or iron overload. 3.  Vitamin B12 and folate are normal. 4.  Normal kidney function and electrolytes.  Drink 64 ounces of water daily to prevent dehydration. 5.  Celiac labs are pending. 6.  Continue with plan for EGD and colonoscopy as scheduled.

## 2022-10-17 ENCOUNTER — Ambulatory Visit
Admission: RE | Admit: 2022-10-17 | Discharge: 2022-10-17 | Disposition: A | Discharge status: Home / Self Care | Payer: Medicaid Other | Source: Ambulatory Visit | Attending: Obstetrics and Gynecology | Admitting: Obstetrics and Gynecology

## 2022-10-17 DIAGNOSIS — Z1231 Encounter for screening mammogram for malignant neoplasm of breast: Secondary | ICD-10-CM | POA: Insufficient documentation

## 2022-10-22 ENCOUNTER — Encounter: Payer: Self-pay | Admitting: Physician Assistant

## 2022-10-27 ENCOUNTER — Emergency Department (HOSPITAL_COMMUNITY)
Admission: EM | Admit: 2022-10-27 | Discharge: 2022-10-27 | Disposition: A | Discharge status: Home / Self Care | Payer: Medicaid Other | Attending: Emergency Medicine | Admitting: Emergency Medicine

## 2022-10-27 ENCOUNTER — Emergency Department (HOSPITAL_COMMUNITY): Payer: Medicaid Other

## 2022-10-27 ENCOUNTER — Other Ambulatory Visit: Payer: Self-pay

## 2022-10-27 DIAGNOSIS — K439 Ventral hernia without obstruction or gangrene: Secondary | ICD-10-CM | POA: Diagnosis not present

## 2022-10-27 DIAGNOSIS — Z79899 Other long term (current) drug therapy: Secondary | ICD-10-CM | POA: Insufficient documentation

## 2022-10-27 DIAGNOSIS — E876 Hypokalemia: Secondary | ICD-10-CM | POA: Diagnosis not present

## 2022-10-27 DIAGNOSIS — R1013 Epigastric pain: Secondary | ICD-10-CM | POA: Diagnosis present

## 2022-10-27 LAB — CBC
HCT: 35.8 % — ABNORMAL LOW (ref 36.0–46.0)
Hemoglobin: 10.6 g/dL — ABNORMAL LOW (ref 12.0–15.0)
MCH: 22.6 pg — ABNORMAL LOW (ref 26.0–34.0)
MCHC: 29.6 g/dL — ABNORMAL LOW (ref 30.0–36.0)
MCV: 76.5 fL — ABNORMAL LOW (ref 80.0–100.0)
Platelets: 335 10*3/uL (ref 150–400)
RBC: 4.68 MIL/uL (ref 3.87–5.11)
RDW: 15.1 % (ref 11.5–15.5)
WBC: 7.3 10*3/uL (ref 4.0–10.5)
nRBC: 0 % (ref 0.0–0.2)

## 2022-10-27 LAB — URINALYSIS, ROUTINE W REFLEX MICROSCOPIC
Bilirubin Urine: NEGATIVE
Glucose, UA: NEGATIVE mg/dL
Hgb urine dipstick: NEGATIVE
Ketones, ur: NEGATIVE mg/dL
Leukocytes,Ua: NEGATIVE
Nitrite: NEGATIVE
Protein, ur: NEGATIVE mg/dL
Specific Gravity, Urine: 1.02 (ref 1.005–1.030)
pH: 5 (ref 5.0–8.0)

## 2022-10-27 LAB — COMPREHENSIVE METABOLIC PANEL
ALT: 21 U/L (ref 0–44)
AST: 21 U/L (ref 15–41)
Albumin: 3.8 g/dL (ref 3.5–5.0)
Alkaline Phosphatase: 62 U/L (ref 38–126)
Anion gap: 7 (ref 5–15)
BUN: 15 mg/dL (ref 6–20)
CO2: 28 mmol/L (ref 22–32)
Calcium: 8.9 mg/dL (ref 8.9–10.3)
Chloride: 103 mmol/L (ref 98–111)
Creatinine, Ser: 1.08 mg/dL — ABNORMAL HIGH (ref 0.44–1.00)
GFR, Estimated: >60 mL/min (ref >60)
Glucose, Bld: 103 mg/dL — ABNORMAL HIGH (ref 70–99)
Potassium: 3.2 mmol/L — ABNORMAL LOW (ref 3.5–5.1)
Sodium: 138 mmol/L (ref 135–145)
Total Bilirubin: 0.6 mg/dL (ref 0.3–1.2)
Total Protein: 7.8 g/dL (ref 6.5–8.1)

## 2022-10-27 LAB — LIPASE, BLOOD: Lipase: 26 U/L (ref 11–51)

## 2022-10-27 MED ORDER — SODIUM CHLORIDE 0.9 % IV BOLUS
1000.0000 mL | Freq: Once | INTRAVENOUS | Status: AC
  Administered 2022-10-27: 1000 mL via INTRAVENOUS

## 2022-10-27 MED ORDER — LIDOCAINE-PRILOCAINE 2.5-2.5 % EX CREA
TOPICAL_CREAM | Freq: Once | CUTANEOUS | Status: AC
  Filled 2022-10-27 (×2): qty 5

## 2022-10-27 MED ORDER — MORPHINE SULFATE (PF) 4 MG/ML IV SOLN
4.0000 mg | Freq: Once | INTRAVENOUS | Status: AC
  Administered 2022-10-27: 4 mg via INTRAVENOUS
  Filled 2022-10-27: qty 1

## 2022-10-27 MED ORDER — IOHEXOL 350 MG/ML SOLN
75.0000 mL | Freq: Once | INTRAVENOUS | Status: AC | PRN
  Administered 2022-10-27: 75 mL via INTRAVENOUS

## 2022-10-27 MED ORDER — ONDANSETRON HCL 4 MG/2ML IJ SOLN
4.0000 mg | Freq: Once | INTRAMUSCULAR | Status: AC
  Administered 2022-10-27: 4 mg via INTRAVENOUS
  Filled 2022-10-27: qty 2

## 2022-10-27 MED ORDER — ONDANSETRON 4 MG PO TBDP: 4.0000 mg | ORAL_TABLET | Freq: Three times a day (TID) | ORAL | 0 refills | Status: DC | PRN

## 2022-10-27 NOTE — Discharge Instructions (Signed)
It was a pleasure taking care of you here in the emergency department today  Your hernia was reduced however this likely will return based off of pressure on the abdomen.  If you develop pain I would recommend taking Tylenol, laying an ice pack on your stomach and laying flat and seeing if this can reduce or go back into place.  I have written you for some Zofran which is a nausea medicine  Make sure to follow-up with general surgery for definitive management of your hernia  Return for new or worsening symptoms

## 2022-10-27 NOTE — ED Notes (Signed)
Waiting for EMLA cream. Pharmacy has been notified. Pt refuses to left staff start IV until cream  is applied.

## 2022-10-27 NOTE — ED Notes (Signed)
Patient refusing IV at this time.

## 2022-10-27 NOTE — ED Provider Notes (Signed)
EMERGENCY DEPARTMENT AT [facility] Provider Note   CSN: [id number] Arrival date & time: 10/27/22  0403    History  Chief Complaint  Patient presents with   Abdominal Pain    Ashley Mcpherson is a 47 y.o. female here for evaluation of epigastric abd pain.  Was told she had a hernia to her upper abdomen that is not present there for many years. Pain to upper abdomen began yesterday. Some nausea without vomiting. No fever, emesis, CP, SOB, back pain, diarrhea. Has chronic constipation, takes meds to have BM regularly. Has BM yesterday, no blood. Thinks she was told previously she had a hernia to her lower abdomen as well however no pain there. No hx of PUD. No RUQ pain, dysuria, hematuria, fever, chest pain, shortness of breath, melena, bright blood per rectum. Prior hysterectomy. No chronic NSAID use, ETOH use, hx of pancreatitis.  HPI     Home Medications Prior to Admission medications   Medication Sig Start Date End Date Taking? Authorizing Provider  ondansetron (ZOFRAN-ODT) 4 MG disintegrating tablet Take 1 tablet (4 mg total) by mouth every 8 (eight) hours as needed. 10/27/22  Yes Herron Fero A, PA-C  amitriptyline (ELAVIL) 25 MG tablet Take 25 mg by mouth at bedtime. 09/19/22   [provider]  hydrocortisone (ANUSOL-HC) 2.5 % rectal cream Place 1 Application rectally 2 (two) times daily. 10/05/22   [name], PA-C  ibuprofen (ADVIL) 600 MG tablet Take 1 tablet (600 mg total) by mouth every 8 (eight) hours as needed for moderate pain. 04/18/22   Simmons-Robinson, Makiera, MD  losartan-hydrochlorothiazide (HYZAAR) 50-12.5 MG tablet Take 1 tablet by mouth daily. 05/28/21   Bing Neighbors, NP      Allergies    Sulfa antibiotics    Review of Systems   Review of Systems  Constitutional: Negative.   HENT: Negative.    Respiratory: Negative.    Cardiovascular: Negative.   Gastrointestinal:  Positive for abdominal pain and nausea. Negative for  abdominal distention, anal bleeding, blood in stool, constipation, diarrhea, rectal pain and vomiting.  Genitourinary: Negative.   Musculoskeletal: Negative.   Skin: Negative.   Neurological: Negative.   All other systems reviewed and are negative.   Physical Exam Updated Vital Signs BP 138/87   Pulse 62   Temp (!) 97.5 F (36.4 C)   Resp 18   SpO2 100%  Physical Exam Vitals and nursing note reviewed.  Constitutional:      General: She is not in acute distress.    Appearance: She is well-developed. She is not ill-appearing, toxic-appearing or diaphoretic.  HENT:     Head: Normocephalic and atraumatic.  Eyes:     Pupils: Pupils are equal, round, and reactive to light.  Cardiovascular:     Rate and Rhythm: Normal rate.     Pulses: Normal pulses.          Radial pulses are 2+ on the right side and 2+ on the left side.       Dorsalis pedis pulses are 2+ on the right side and 2+ on the left side.     Heart sounds: Normal heart sounds.  Pulmonary:     Effort: Pulmonary effort is normal. No respiratory distress.     Breath sounds: Normal breath sounds.  Abdominal:     General: Bowel sounds are normal. There is no distension.     Palpations: Abdomen is soft.     Tenderness: There is abdominal tenderness in  the epigastric area. There is no right CVA tenderness, left CVA tenderness, guarding or rebound. Negative signs include Murphy's sign and McBurney's sign.     Hernia: A hernia is present. Hernia is present in the ventral area.     Comments: Small ventral hernia to epigastric region.  No overlying erythema, warmth.  Musculoskeletal:        General: Normal range of motion.     Cervical back: Normal range of motion.  Skin:    General: Skin is warm and dry.  Neurological:     General: No focal deficit present.     Mental Status: She is alert.  Psychiatric:        Mood and Affect: Mood normal.    ED Results / Procedures / Treatments   Labs (all labs ordered are listed, but  only abnormal results are displayed) Labs Reviewed  COMPREHENSIVE METABOLIC PANEL - Abnormal; Notable for the following components:      Result Value   Potassium 3.2 (*)    Glucose, Bld 103 (*)    Creatinine, Ser 1.08 (*)    All other components within normal limits  CBC - Abnormal; Notable for the following components:   Hemoglobin 10.6 (*)    HCT 35.8 (*)    MCV 76.5 (*)    MCH 22.6 (*)    MCHC 29.6 (*)    All other components within normal limits  URINALYSIS, ROUTINE W REFLEX MICROSCOPIC - Abnormal; Notable for the following components:   APPearance HAZY (*)    All other components within normal limits  LIPASE, BLOOD    EKG None  Radiology CT ABDOMEN PELVIS W CONTRAST  Result Date: 10/27/2022 CLINICAL DATA:  Epigastric pain the site of ventral abdominal wall hernia EXAM: CT ABDOMEN AND PELVIS WITH CONTRAST TECHNIQUE: Multidetector CT imaging of the abdomen and pelvis was performed using the standard protocol following bolus administration of intravenous contrast. RADIATION DOSE REDUCTION: This exam was performed according to the departmental dose-optimization program which includes automated exposure control, adjustment of the mA and/or kV according to patient size and/or use of iterative reconstruction technique. CONTRAST:  75mL OMNIPAQUE IOHEXOL 350 MG/ML SOLN COMPARISON:  Ultrasound abdomen dated 05/17/2022 FINDINGS: Lower chest: No focal consolidation or pulmonary nodule in the lung bases. No pleural effusion or pneumothorax demonstrated. Partially imaged heart size is normal. Hepatobiliary: No focal hepatic lesions. No intra or extrahepatic biliary ductal dilation. Normal gallbladder. Pancreas: No focal lesions or main ductal dilation. Spleen: Normal in size without focal abnormality. Adrenals/Urinary Tract: No adrenal nodules. Bilateral renal cortical scarring. No suspicious renal mass or hydronephrosis. Bilateral punctate nonobstructing renal stones. No focal bladder wall  thickening. Stomach/Bowel: Normal appearance of the stomach. No evidence of bowel wall thickening, distention, or inflammatory changes. Normal appendix. Vascular/Lymphatic: No significant vascular findings are present. No enlarged abdominal or pelvic lymph nodes. Reproductive: No adnexal masses. Other: No free fluid, fluid collection, or free air. Musculoskeletal: No acute or abnormal lytic or blastic osseous lesions. Small fat containing ventral midline upper abdominal hernia (3:24) and bilateral inguinal hernias. IMPRESSION: 1. Small fat containing ventral midline upper abdominal hernia and bilateral inguinal hernias. Recommend correlation with physical examination for incarceration. 2. Bilateral punctate nonobstructing renal stones. Electronically Signed   By: Agustin Cree M.D.   On: 10/27/2022 11:09    Procedures Hernia reduction  Date/Time: 10/27/2022 3:12 PM  Performed by: Linwood Dibbles, PA-C Authorized by: Linwood Dibbles, PA-C  Consent: Verbal consent obtained. Written consent not obtained. Risks  and benefits: risks, benefits and alternatives were discussed Consent given by: patient Patient understanding: patient states understanding of the procedure being performed Patient consent: the patient's understanding of the procedure matches consent given Procedure consent: procedure consent matches procedure scheduled Relevant documents: relevant documents present and verified Test results: test results available and properly labeled Site marked: the operative site was marked Imaging studies: imaging studies available Required items: required blood products, implants, devices, and special equipment available Patient identity confirmed: verbally with patient Time out: Immediately prior to procedure a "time out" was called to verify the correct patient, procedure, equipment, support staff and site/side marked as required. Preparation: Patient was prepped and draped in the usual sterile  fashion. Local anesthesia used: no  Anesthesia: Local anesthesia used: no  Sedation: Patient sedated: no  Patient tolerance: patient tolerated the procedure well with no immediate complications Comments: Small ventral hernia in epigastric region reduced without difficulty.       Medications Ordered in ED Medications  morphine (PF) 4 MG/ML injection 4 mg (4 mg Intravenous Given 10/27/22 0953)  sodium chloride 0.9 % bolus 1,000 mL (0 mLs Intravenous Stopped 10/27/22 1231)  ondansetron (ZOFRAN) injection 4 mg (4 mg Intravenous Given 10/27/22 0953)  lidocaine-prilocaine (EMLA) cream ( Topical Given 10/27/22 0936)  iohexol (OMNIPAQUE) 350 MG/ML injection 75 mL (75 mLs Intravenous Contrast Given 10/27/22 1034)  morphine (PF) 4 MG/ML injection 4 mg (4 mg Intravenous Given 10/27/22 1226)   ED Course/ Medical Decision Making/ A&P Clinical Course as of 10/27/22 1514  Fri Oct 27, 2022  3474 Patient initially refusing IV for CT scan and pain management.  She does agree to IV placement after Emla cream.  Ordered from pharmacy.  Unfortunately delay due to pharmacy [BH]    Clinical Course User Index [BH] Deeksha Cotrell A, PA-C   47 year old here for evaluation of abdominal pain.  Began yesterday.  Associated nausea without vomiting.  Bowel movement yesterday without any blood.  His chronic constipation at baseline.  No urinary symptoms.  Prior hysterectomy denies chance of pregnancy.  No chest pain, shortness of breath or cough.  Patient states history of hernia.  No chronic NSAID use, EtOH use, history of pancreatitis.  Abdomen soft.  She has small ventral hernia to epigastric region, no overlying erythema or warmth.  Area however is tender.  Patient reluctant to reduction of hernia given pain however is also has anxiety with needles.  Labs personally viewed and interpreted:  CBC without leukocytosis, Hgb 10.6 similar to prior CMP potassium 3.2, creatinine 1.08 similar to prior UA neg CT AP fat  containing ventral hernia, correlate clinically for incarceration  Patient reassessed.  Pain controlled.  Discussed her labs and imaging.  She has no overlying erythema surrounding her hernia site.  Will attempt at reduction.  Patient provided pain management, ice, hernia able to be reduced.   Patient reassessed.  I was able to reduce her ventral hernia.  Patient denies any pain.  She is tolerating p.o. intake.  I discussed definitive management outpatient with general surgery.  I suspect her pain was likely due to her fat-containing hernia.  I have low suspicion for ulcer, cholecystitis, choledocholithiasis, obstruction, perforation, dissection, PE, pneumothorax, sepsis, appendicitis, GI bleed.  The patient has been appropriately medically screened and/or stabilized in the ED. I have low suspicion for any other emergent medical condition which would require further screening, evaluation or treatment in the ED or require inpatient management.  Patient is hemodynamically stable and in no acute distress.  Patient able to ambulate in department prior to ED.  Evaluation does not show acute pathology that would require ongoing or additional emergent interventions while in the emergency department or further inpatient treatment.  I have discussed the diagnosis with the patient and answered all questions.  Pain is been managed while in the emergency department and patient has no further complaints prior to discharge.  Patient is comfortable with plan discussed in room and is stable for discharge at this time.  I have discussed strict return precautions for returning to the emergency department.  Patient was encouraged to follow-up with PCP/specialist refer to at discharge.                                  Medical Decision Making Amount and/or Complexity of Data Reviewed External Data Reviewed: labs, radiology and notes. Labs: ordered. Decision-making details documented in ED Course. Radiology: ordered  and independent interpretation performed. Decision-making details documented in ED Course.  Risk OTC drugs. Prescription drug management. Parenteral controlled substances. Decision regarding hospitalization. Diagnosis or treatment significantly limited by social determinants of health.         Final Clinical Impression(s) / ED Diagnoses Final diagnoses:  Ventral hernia without obstruction or gangrene    Rx / DC Orders ED Discharge Orders          Ordered    ondansetron (ZOFRAN-ODT) 4 MG disintegrating tablet  Every 8 hours PRN        10/27/22 1445              Josanna Hefel A, PA-C 10/27/22 1514    Loetta Rough, MD 10/27/22 1546

## 2022-10-27 NOTE — ED Notes (Signed)
Pt refused IV, EDP informed.

## 2022-10-27 NOTE — ED Triage Notes (Signed)
Patient reports mid abdominal hernia pain this evening , denies emesis or diarrhea , no fever or chills.

## 2022-10-27 NOTE — ED Notes (Signed)
Patient transported to CT 

## 2022-11-15 ENCOUNTER — Other Ambulatory Visit: Payer: Self-pay

## 2022-11-15 ENCOUNTER — Emergency Department (HOSPITAL_BASED_OUTPATIENT_CLINIC_OR_DEPARTMENT_OTHER): Payer: Medicaid Other

## 2022-11-15 ENCOUNTER — Emergency Department (HOSPITAL_BASED_OUTPATIENT_CLINIC_OR_DEPARTMENT_OTHER): Payer: Medicaid Other | Admitting: Radiology

## 2022-11-15 ENCOUNTER — Encounter (HOSPITAL_BASED_OUTPATIENT_CLINIC_OR_DEPARTMENT_OTHER): Payer: Self-pay

## 2022-11-15 ENCOUNTER — Emergency Department (HOSPITAL_BASED_OUTPATIENT_CLINIC_OR_DEPARTMENT_OTHER)
Admission: EM | Admit: 2022-11-15 | Discharge: 2022-11-16 | Disposition: A | Discharge status: Home / Self Care | Payer: Medicaid Other | Attending: Emergency Medicine | Admitting: Emergency Medicine

## 2022-11-15 DIAGNOSIS — R002 Palpitations: Secondary | ICD-10-CM | POA: Diagnosis present

## 2022-11-15 DIAGNOSIS — Z79899 Other long term (current) drug therapy: Secondary | ICD-10-CM | POA: Insufficient documentation

## 2022-11-15 DIAGNOSIS — D649 Anemia, unspecified: Secondary | ICD-10-CM | POA: Diagnosis not present

## 2022-11-15 DIAGNOSIS — R600 Localized edema: Secondary | ICD-10-CM | POA: Insufficient documentation

## 2022-11-15 DIAGNOSIS — I10 Essential (primary) hypertension: Secondary | ICD-10-CM | POA: Diagnosis not present

## 2022-11-15 LAB — CBC
HCT: 31.6 % — ABNORMAL LOW (ref 36.0–46.0)
Hemoglobin: 9.7 g/dL — ABNORMAL LOW (ref 12.0–15.0)
MCH: 23.3 pg — ABNORMAL LOW (ref 26.0–34.0)
MCHC: 30.7 g/dL (ref 30.0–36.0)
MCV: 76 fL — ABNORMAL LOW (ref 80.0–100.0)
Platelets: 294 10*3/uL (ref 150–400)
RBC: 4.16 MIL/uL (ref 3.87–5.11)
RDW: 15.5 % (ref 11.5–15.5)
WBC: 7.3 10*3/uL (ref 4.0–10.5)
nRBC: 0 % (ref 0.0–0.2)

## 2022-11-15 LAB — BASIC METABOLIC PANEL
Anion gap: 7 (ref 5–15)
BUN: 11 mg/dL (ref 6–20)
CO2: 29 mmol/L (ref 22–32)
Calcium: 8.7 mg/dL — ABNORMAL LOW (ref 8.9–10.3)
Chloride: 104 mmol/L (ref 98–111)
Creatinine, Ser: 0.81 mg/dL (ref 0.44–1.00)
GFR, Estimated: >60 mL/min (ref >60)
Glucose, Bld: 93 mg/dL (ref 70–99)
Potassium: 3.5 mmol/L (ref 3.5–5.1)
Sodium: 140 mmol/L (ref 135–145)

## 2022-11-15 LAB — D-DIMER, QUANTITATIVE: D-Dimer, Quant: <0.27 ug{FEU}/mL (ref 0.00–0.50)

## 2022-11-15 LAB — BRAIN NATRIURETIC PEPTIDE: B Natriuretic Peptide: 177.9 pg/mL — ABNORMAL HIGH (ref 0.0–100.0)

## 2022-11-15 LAB — TROPONIN I (HIGH SENSITIVITY): Troponin I (High Sensitivity): 2 ng/L (ref <18)

## 2022-11-15 MED ORDER — FUROSEMIDE 40 MG PO TABS
40.0000 mg | ORAL_TABLET | Freq: Once | ORAL | Status: AC
  Administered 2022-11-16: 40 mg via ORAL
  Filled 2022-11-15: qty 1

## 2022-11-15 NOTE — ED Triage Notes (Signed)
Swelling in feet and ankles onset yesterday with some numbness, feels like heart has been racing, denies chest pain, endorses SOB that started yesterday with exertion.

## 2022-11-15 NOTE — ED Notes (Signed)
Good pedal pulses bilaterally

## 2022-11-15 NOTE — ED Provider Notes (Signed)
[place] EMERGENCY DEPARTMENT AT [facility] Provider Note   CSN: [id number] Arrival date & time: 11/15/22  2115     History {Add pertinent medical, surgical, social history, OB history to HPI:1} Chief Complaint  Patient presents with   Foot Swelling   Palpitations    Ashley Mcpherson is a 47 y.o. female.  Patient with a history of hypertension Hyzaar presenting with bilateral feet and ankle swelling for the past 2 days.  Describes "restless legs" as well as numbness to her bilateral feet and shins.  Denies leg pain.  Feels she is more swollen left greater than right.  Has never had this problem before.  Does notice some exertional shortness of breath as well that resolves with rest.  No chest pain.  No cough or fever.  Normally able to lie flat without difficulty.  No history of CHF, COPD, DVT or PE.  No history of CAD.  Not having any shortness of breath currently.  Only when she exerts herself.  Has noticed increased leg swelling as well.  Denies standing on her feet for prolonged period of time.  No history of previous echocardiogram or stress test.  The history is provided by the patient.  Palpitations Associated symptoms: shortness of breath   Associated symptoms: no dizziness, no nausea, no vomiting and no weakness        Home Medications Prior to Admission medications   Medication Sig Start Date End Date Taking? Authorizing Provider  amitriptyline (ELAVIL) 25 MG tablet Take 25 mg by mouth at bedtime. 09/19/22   [provider]  hydrocortisone (ANUSOL-HC) 2.5 % rectal cream Place 1 Application rectally 2 (two) times daily. 10/05/22   [name], PA-C  ibuprofen (ADVIL) 600 MG tablet Take 1 tablet (600 mg total) by mouth every 8 (eight) hours as needed for moderate pain. 04/18/22   Simmons-Robinson, Makiera, MD  losartan-hydrochlorothiazide (HYZAAR) 50-12.5 MG tablet Take 1 tablet by mouth daily. 05/28/21   Bing Neighbors, NP  ondansetron (ZOFRAN-ODT) 4 MG  disintegrating tablet Take 1 tablet (4 mg total) by mouth every 8 (eight) hours as needed. 10/27/22   Henderly, Britni A, PA-C      Allergies    Sulfa antibiotics    Review of Systems   Review of Systems  Constitutional:  Negative for activity change, appetite change and fever.  HENT:  Negative for congestion and rhinorrhea.   Respiratory:  Positive for shortness of breath. Negative for chest tightness.   Cardiovascular:  Positive for palpitations and leg swelling.  Gastrointestinal:  Negative for nausea and vomiting.  Genitourinary:  Negative for dysuria and hematuria.  Musculoskeletal:  Negative for arthralgias.  Skin:  Negative for rash.  Neurological:  Negative for dizziness, weakness and headaches.   all other systems are negative except as noted in the HPI and PMH.    Physical Exam Updated Vital Signs BP (!) 158/102   Pulse 75   Temp (!) 97.3 F (36.3 C)   Resp 17   Ht 5\' 4"  (1.626 m)   Wt 81.6 kg   SpO2 100%   BMI 30.90 kg/m  Physical Exam Vitals and nursing note reviewed.  Constitutional:      General: She is not in acute distress.    Appearance: She is well-developed.     Comments: Lungs clear, speaking full sentences  HENT:     Head: Normocephalic and atraumatic.     Mouth/Throat:     Pharynx: No oropharyngeal exudate.  Eyes:  Conjunctiva/sclera: Conjunctivae normal.     Pupils: Pupils are equal, round, and reactive to light.  Neck:     Comments: No meningismus. Cardiovascular:     Rate and Rhythm: Normal rate and regular rhythm.     Heart sounds: Normal heart sounds. No murmur heard. Pulmonary:     Effort: Pulmonary effort is normal. No respiratory distress.     Breath sounds: Normal breath sounds.  Abdominal:     Palpations: Abdomen is soft.     Tenderness: There is no abdominal tenderness. There is no guarding or rebound.  Musculoskeletal:        General: No tenderness. Normal range of motion.     Cervical back: Normal range of motion and neck  supple.     Right lower leg: Edema present.     Left lower leg: Edema present.     Comments: Trace pedal edema left greater than right.  No calf asymmetry or tenderness.  Intact DP and PT pulses.  Compartments soft.  Skin:    General: Skin is warm.  Neurological:     Mental Status: She is alert and oriented to person, place, and time.     Cranial Nerves: No cranial nerve deficit.     Motor: No abnormal muscle tone.     Coordination: Coordination normal.     Comments:  5/5 strength throughout. CN 2-12 intact.Equal grip strength.   Psychiatric:        Behavior: Behavior normal.     ED Results / Procedures / Treatments   Labs (all labs ordered are listed, but only abnormal results are displayed) Labs Reviewed  BASIC METABOLIC PANEL - Abnormal; Notable for the following components:      Result Value   Calcium 8.7 (*)    All other components within normal limits  CBC - Abnormal; Notable for the following components:   Hemoglobin 9.7 (*)    HCT 31.6 (*)    MCV 76.0 (*)    MCH 23.3 (*)    All other components within normal limits  BRAIN NATRIURETIC PEPTIDE - Abnormal; Notable for the following components:   B Natriuretic Peptide 177.9 (*)    All other components within normal limits  D-DIMER, QUANTITATIVE  MAGNESIUM  TROPONIN I (HIGH SENSITIVITY)  TROPONIN I (HIGH SENSITIVITY)    EKG EKG Interpretation Date/Time:  Wednesday November 15 2022 21:23:56 EDT Ventricular Rate:  77 PR Interval:  178 QRS Duration:  84 QT Interval:  390 QTC Calculation: 441 R Axis:   9  Text Interpretation: Normal sinus rhythm Normal ECG Confirmed by Cathren Laine ([id number]) on 11/15/2022 9:47:27 PM  Radiology DG Chest 2 View  Result Date: 11/15/2022 CLINICAL DATA:  SOB EXAM: CHEST - 2 VIEW COMPARISON:  CXR 06/05/16 FINDINGS: No pleural effusion. No pneumothorax. No focal airspace opacity. Normal cardiac and mediastinal contours. No radiographically apparent displaced rib fractures. Visualized upper  abdomen is unremarkable. Vertebral body heights are maintained. Mild colonic gaseous distention of the splenic flexure. IMPRESSION: No focal airspace opacity Electronically Signed   By: Lorenza Cambridge M.D.   On: 11/15/2022 21:55    Procedures Procedures  {Document cardiac monitor, telemetry assessment procedure when appropriate:1}  Medications Ordered in ED Medications - No data to display  ED Course/ Medical Decision Making/ A&P   {   Click here for ABCD2, HEART and other calculatorsREFRESH Note before signing :1}  Medical Decision Making Amount and/or Complexity of Data Reviewed Labs: ordered. Decision-making details documented in ED Course. Radiology: ordered and independent interpretation performed. Decision-making details documented in ED Course. ECG/medicine tests: ordered and independent interpretation performed. Decision-making details documented in ED Course.  Risk Prescription drug management.   2 days of leg swelling as well as shortness of breath.  No hypoxia or increased work of breathing.  Lung sounds are clear and chest x-ray shows no significant edema.  Results reviewed and interpreted by me.  EKG without acute ischemia.  Pleasant patient for ACS.  Will evaluate for possible new onset CHF with BNP as well as screen for PE/DVT.  Labs with stable anemia.  BNP 178.  No significant edema seen on chest x-ray.  D-dimer negative with low suspicion for PE or DVT. {Document critical care time when appropriate:1} {Document review of labs and clinical decision tools ie heart score, Chads2Vasc2 etc:1}  {Document your independent review of radiology images, and any outside records:1} {Document your discussion with family members, caretakers, and with consultants:1} {Document social determinants of health affecting pt's care:1} {Document your decision making why or why not admission, treatments were needed:1} Final Clinical Impression(s) / ED  Diagnoses Final diagnoses:  None    Rx / DC Orders ED Discharge Orders     None

## 2022-11-16 ENCOUNTER — Encounter: Payer: Self-pay | Admitting: Family Medicine

## 2022-11-16 ENCOUNTER — Ambulatory Visit: Payer: Medicaid Other | Admitting: Family Medicine

## 2022-11-16 VITALS — BP 144/99 | HR 71 | Temp 98.6°F | Ht 64.0 in | Wt 182.0 lb

## 2022-11-16 DIAGNOSIS — M7989 Other specified soft tissue disorders: Secondary | ICD-10-CM

## 2022-11-16 DIAGNOSIS — B353 Tinea pedis: Secondary | ICD-10-CM

## 2022-11-16 DIAGNOSIS — G2581 Restless legs syndrome: Secondary | ICD-10-CM

## 2022-11-16 LAB — MAGNESIUM: Magnesium: 1.9 mg/dL (ref 1.7–2.4)

## 2022-11-16 LAB — TROPONIN I (HIGH SENSITIVITY): Troponin I (High Sensitivity): 3 ng/L (ref <18)

## 2022-11-16 MED ORDER — ROPINIROLE HCL 0.5 MG PO TABS: ORAL_TABLET | ORAL | 2 refills | Status: DC

## 2022-11-16 NOTE — ED Notes (Signed)
97-99% O2 sats while ambulating

## 2022-11-16 NOTE — Progress Notes (Signed)
Established patient visit   Patient: [place] Mcpherson   DOB: 01-28-1976   47 y.o. Female  MRN: [id number] Visit Date: 11/16/2022  Today's healthcare provider: Ronnald Ramp, MD   Chief Complaint  Patient presents with   Hospitalization Follow-up    Patient was seen in the ER on 11/15/22 for edema and shortness of breath.  She states her edema is improving but her SOB is not a lot better.  Worse with exertion but present at rest.   Subjective     HPI     Hospitalization Follow-up    Additional comments: Patient was seen in the ER on 11/15/22 for edema and shortness of breath.  She states her edema is improving but her SOB is not a lot better.  Worse with exertion but present at rest.      Last edited by Adline Peals, CMA on 11/16/2022  1:40 PM.       Discussed the use of AI scribe software for clinical note transcription with the patient, who gave verbal consent to proceed.  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 The swelling was significant enough to cause discomfort and difficulty wearing shoes. The patient also reported a sensation of pins and needles in the affected areas. The swelling has since improved, but the patient still reports a slight residual swelling and tightness in her shoes.  In addition to the foot swelling, the patient has been experiencing episodes of shortness of breath and palpitations, particularly after exertion such as climbing stairs. The patient also reports a persistent issue with restless leg syndrome, primarily affecting the left leg, which she has been managing with over-the-counter medication from [employer] with limited success.  The patient's recent ED visit revealed anemia with a hemoglobin level of 9.7 and an elevated BNP at 177. The patient was treated  with 40 mg of Lasix. The patient also has a history of hypertension and is on losartan hydrochlorothiazide (50/12.5 mg). The patient reported being out of this medication for a few days prior to the consultation.  The patient also reports a long-standing issue with athlete's foot, which has been resistant to over-the-counter antifungal treatments. The condition is severe enough to cause the patient to scratch her feet until they bleed.      Evaluated in the ED for pedal edema on 11/15/22  Upon review of chart in EMR note:  Labs were noted for elevated BNP 177, hgb 9.7 (previously 10 or greater) Notable normal labs/studies included  -negative DVT study  -negative CXR   She was treated with 40mg  of lasix once   Discussed the use of AI scribe software for clinical note transcription with the patient, who gave verbal consent to proceed.  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 The swelling was significant enough to cause discomfort and difficulty wearing shoes. The patient also reported a sensation of pins and needles in the affected areas. The swelling has since improved, but the patient still reports a slight residual swelling and tightness in her shoes.  In addition to the foot swelling, the patient has been experiencing episodes of  shortness of breath and palpitations, particularly after exertion such as climbing stairs. The patient also reports a persistent issue with restless leg syndrome, primarily affecting the left leg, which she has been managing with over-the-counter medication from [employer] with limited success.  The patient's recent ED visit revealed anemia with a hemoglobin level of 9.7 and an elevated BNP at 177. The patient was treated with 40 mg of Lasix. The patient also has a history of hypertension and  is on losartan hydrochlorothiazide (50/12.5 mg). The patient reported being out of this medication for a few days prior to the consultation.  The patient also reports a long-standing issue with athlete's foot, which has been resistant to over-the-counter antifungal treatments. The condition is severe enough to cause the patient to scratch her feet until they bleed.           Past Medical History:  Diagnosis Date   GERD (gastroesophageal reflux disease)    Hypertension    IBS (irritable bowel syndrome)    Kidney stone     Medications: Outpatient Medications Prior to Visit  Medication Sig   amitriptyline (ELAVIL) 25 MG tablet Take 25 mg by mouth at bedtime.   hydrocortisone (ANUSOL-HC) 2.5 % rectal cream Place 1 Application rectally 2 (two) times daily.   ibuprofen (ADVIL) 600 MG tablet Take 1 tablet (600 mg total) by mouth every 8 (eight) hours as needed for moderate pain.   losartan-hydrochlorothiazide (HYZAAR) 50-12.5 MG tablet Take 1 tablet by mouth daily.   ondansetron (ZOFRAN-ODT) 4 MG disintegrating tablet Take 1 tablet (4 mg total) by mouth every 8 (eight) hours as needed.   No facility-administered medications prior to visit.    Review of Systems      Objective    BP (!) 144/99 (BP Location: Left Arm, Patient Position: Sitting, Cuff Size: Normal)   Pulse 71   Temp 98.6 F (37 C) (Oral)   Ht 5\' 4"  (1.626 m)   Wt 182 lb (82.6 kg)   SpO2 100%   BMI 31.24 kg/m     Physical Exam  Feet: left ankle with mild edema, normal PT DP pulses bilaterally, moisture of skin between toes bilaterally, dry skin  Cards: RRR without murmur  Pulm: no wheezing, no rhonchi, no crackles    Results for orders placed or performed during the hospital encounter of 11/15/22  Basic metabolic panel  Result Value Ref Range   Sodium 140 135 - 145 mmol/L   Potassium 3.5 3.5 - 5.1 mmol/L   Chloride 104 98 - 111 mmol/L   CO2 29 22 - 32 mmol/L   Glucose, Bld 93 70 - 99 mg/dL   BUN 11 6  - 20 mg/dL   Creatinine, Ser 9.56 0.44 - 1.00 mg/dL   Calcium 8.7 (L) 8.9 - 10.3 mg/dL   GFR, Estimated >38 >75 mL/min   Anion gap 7 5 - 15  CBC  Result Value Ref Range   WBC 7.3 4.0 - 10.5 K/uL   RBC 4.16 3.87 - 5.11 MIL/uL   Hemoglobin 9.7 (L) 12.0 - 15.0 g/dL   HCT 64.3 (L) 32.9 - 51.8 %   MCV 76.0 (L) 80.0 - 100.0 fL   MCH 23.3 (L) 26.0 - 34.0 pg   MCHC 30.7 30.0 - 36.0 g/dL   RDW 84.1 66.0 - 63.0 %   Platelets 294 150 - 400 K/uL   nRBC 0.0 0.0 - 0.2 %  Brain natriuretic peptide  Result Value Ref Range   B Natriuretic Peptide 177.9 (  H) 0.0 - 100.0 pg/mL  D-dimer, quantitative  Result Value Ref Range   D-Dimer, Quant <0.27 0.00 - 0.50 ug/mL-FEU  Magnesium  Result Value Ref Range   Magnesium 1.9 1.7 - 2.4 mg/dL  Troponin I (High Sensitivity)  Result Value Ref Range   Troponin I (High Sensitivity) 2 <18 ng/L  Troponin I (High Sensitivity)  Result Value Ref Range   Troponin I (High Sensitivity) 3 <18 ng/L    Assessment & Plan     Problem List Items Addressed This Visit     Foot swelling - Primary   Restless legs   Relevant Medications   rOPINIRole (REQUIP) 0.5 MG tablet   Tinea pedis of both feet   Relevant Orders   Ambulatory referral to Podiatry    Assessment and Plan    Bilateral Lower Extremity Edema Recent episode of significant swelling, more pronounced on the left. No DVT on imaging. BNP mildly elevated. Improved with Lasix 40mg . No current dyspnea or orthopnea. -Continue current management. -Return for evaluation if swelling recurs.  Restless Leg Syndrome Chronic symptoms, partially responsive to over-the-counter medication. -Start Requip 0.25mg  1-2 hours before bedtime. -Increase to 0.5mg  after three days if needed.  Anemia Hemoglobin 9.7 on recent labs. History of beta thalassemia minor. -No current intervention.  Hypertension Currently off medication for a few days. -Resume Losartan/Hydrochlorothiazide 50/12.5mg  daily.  Tinea  Pedis Chronic, severe symptoms with significant pruritus. -Referral to Podiatry for further management.  Follow-up in 3-4 weeks to reassess.         Return in about 3 weeks (around 12/07/2022) for foot swelling, restless legs .         Ronnald Ramp, MD  [facility] [phone] (phone) [phone] (fax)  [facility]

## 2022-11-16 NOTE — Discharge Instructions (Addendum)
No evidence of blood clot in your legs or your lungs.  We suspect your edema is likely secondary to fluid collection in your legs which should improve with water pills and elevation.  Follow-up with your primary doctor or the cardiologist to have an echocardiogram to check the pumping function of your heart as well as a possible stress test.  Return to the ED with difficulty breathing, exertional chest pain, pain associate with shortness of breath, nausea, vomiting or other concerns.

## 2022-11-22 ENCOUNTER — Encounter: Payer: Self-pay | Admitting: Gastroenterology

## 2022-11-22 ENCOUNTER — Encounter
Admission: RE | Disposition: A | Discharge status: Home / Self Care | Payer: Self-pay | Source: Home / Self Care | Attending: Gastroenterology

## 2022-11-22 ENCOUNTER — Ambulatory Visit
Admission: RE | Admit: 2022-11-22 | Discharge: 2022-11-22 | Disposition: A | Discharge status: Home / Self Care | Payer: Medicaid Other | Attending: Gastroenterology | Admitting: Gastroenterology

## 2022-11-22 ENCOUNTER — Ambulatory Visit: Payer: Medicaid Other

## 2022-11-22 ENCOUNTER — Other Ambulatory Visit: Payer: Self-pay

## 2022-11-22 DIAGNOSIS — K635 Polyp of colon: Secondary | ICD-10-CM | POA: Diagnosis not present

## 2022-11-22 DIAGNOSIS — F32A Depression, unspecified: Secondary | ICD-10-CM | POA: Diagnosis not present

## 2022-11-22 DIAGNOSIS — K219 Gastro-esophageal reflux disease without esophagitis: Secondary | ICD-10-CM | POA: Diagnosis not present

## 2022-11-22 DIAGNOSIS — Z09 Encounter for follow-up examination after completed treatment for conditions other than malignant neoplasm: Secondary | ICD-10-CM | POA: Insufficient documentation

## 2022-11-22 DIAGNOSIS — Z1211 Encounter for screening for malignant neoplasm of colon: Secondary | ICD-10-CM | POA: Diagnosis present

## 2022-11-22 DIAGNOSIS — D126 Benign neoplasm of colon, unspecified: Secondary | ICD-10-CM

## 2022-11-22 DIAGNOSIS — D122 Benign neoplasm of ascending colon: Secondary | ICD-10-CM | POA: Diagnosis not present

## 2022-11-22 DIAGNOSIS — I10 Essential (primary) hypertension: Secondary | ICD-10-CM | POA: Diagnosis not present

## 2022-11-22 DIAGNOSIS — R110 Nausea: Secondary | ICD-10-CM

## 2022-11-22 HISTORY — PX: POLYPECTOMY: SHX5525

## 2022-11-22 HISTORY — PX: COLONOSCOPY WITH PROPOFOL: SHX5780

## 2022-11-22 HISTORY — PX: ESOPHAGOGASTRODUODENOSCOPY (EGD) WITH PROPOFOL: SHX5813

## 2022-11-22 SURGERY — COLONOSCOPY WITH PROPOFOL
Anesthesia: General

## 2022-11-22 MED ORDER — PROPOFOL 1000 MG/100ML IV EMUL
INTRAVENOUS | Status: AC
  Filled 2022-11-22: qty 100

## 2022-11-22 MED ORDER — DEXMEDETOMIDINE HCL IN NACL 80 MCG/20ML IV SOLN
INTRAVENOUS | Status: DC | PRN
Start: 2022-11-22 — End: 2022-11-22
  Administered 2022-11-22 (×2): 4 ug via INTRAVENOUS

## 2022-11-22 MED ORDER — LIDOCAINE HCL (PF) 1 % IJ SOLN
INTRAMUSCULAR | Status: AC
  Filled 2022-11-22: qty 2

## 2022-11-22 MED ORDER — SODIUM CHLORIDE 0.9 % IV SOLN: INTRAVENOUS | Status: DC

## 2022-11-22 MED ORDER — PROPOFOL 500 MG/50ML IV EMUL
INTRAVENOUS | Status: DC | PRN
  Administered 2022-11-22: 75 ug/kg/min via INTRAVENOUS

## 2022-11-22 MED ORDER — PROPOFOL 10 MG/ML IV BOLUS
INTRAVENOUS | Status: DC | PRN
  Administered 2022-11-22: 100 mg via INTRAVENOUS
  Administered 2022-11-22: 10 mg via INTRAVENOUS
  Administered 2022-11-22: 20 mg via INTRAVENOUS

## 2022-11-22 MED ORDER — LIDOCAINE HCL (CARDIAC) PF 100 MG/5ML IV SOSY
PREFILLED_SYRINGE | INTRAVENOUS | Status: DC | PRN
  Administered 2022-11-22: 100 mg via INTRAVENOUS

## 2022-11-22 NOTE — Transfer of Care (Signed)
Immediate Anesthesia Transfer of Care Note  Patient: [place] Mcpherson  Procedure(s) Performed: COLONOSCOPY WITH PROPOFOL ESOPHAGOGASTRODUODENOSCOPY (EGD) WITH PROPOFOL POLYPECTOMY  Patient Location: Endoscopy Unit  Anesthesia Type:MAC  Level of Consciousness: awake and patient cooperative  Airway & Oxygen Therapy: Patient Spontanous Breathing and Patient connected to face mask oxygen  Post-op Assessment: Report given to RN and Post -op Vital signs reviewed and stable  Post vital signs: Reviewed and stable  Last Vitals:  Vitals Value Taken Time  BP 120/83 11/22/22 0922  Temp    Pulse 75 11/22/22 0922  Resp 22 11/22/22 0922  SpO2 100 % 11/22/22 0922  Vitals shown include unfiled device data.  Last Pain:  Vitals:   11/22/22 0749  TempSrc: Temporal  PainSc: 0-No pain         Complications: No notable events documented.

## 2022-11-22 NOTE — Op Note (Signed)
[facility] Gastroenterology Patient Name: [place] Mcpherson Procedure Date: 11/22/2022 8:46 AM MRN: [id number] Account #: [id number] Date of Birth: [birth year] Admit Type: Outpatient Age: 47 Room: [facility] ENDO ROOM 3 Gender: Female Note Status: Finalized Instrument Name: Upper Endoscope [id number] Procedure:             Upper GI endoscopy Indications:           Follow-up of esophageal reflux Providers:             Wyline Mood MD, MD Referring MD:          Wyline Mood MD, MD (Referring MD), Nicki Guadalajara                         (Referring MD) Medicines:             Monitored Anesthesia Care Complications:         No immediate complications. Procedure:             Pre-Anesthesia Assessment:                        - Prior to the procedure, a History and Physical was                         performed, and patient medications, allergies and                         sensitivities were reviewed. The patient's tolerance                         of previous anesthesia was reviewed.                        - The risks and benefits of the procedure and the                         sedation options and risks were discussed with the                         patient. All questions were answered and informed                         consent was obtained.                        - ASA Grade Assessment: II - A patient with mild                         systemic disease.                        After obtaining informed consent, the endoscope was                         passed under direct vision. Throughout the procedure,                         the patient's blood pressure, pulse, and oxygen                         saturations  were monitored continuously. The Endoscope                         was introduced through the mouth, and advanced to the                         third part of duodenum. The upper GI endoscopy was                         accomplished with ease. The patient tolerated the                          procedure well. Findings:      The esophagus was normal.      The stomach was normal.      The examined duodenum was normal. Impression:            - Normal esophagus.                        - Normal stomach.                        - Normal examined duodenum.                        - No specimens collected. Recommendation:        - Perform a colonoscopy today. Procedure Code(s):     --- Professional ---                        [phone], Esophagogastroduodenoscopy, flexible,                         transoral; diagnostic, including collection of                         specimen(s) by brushing or washing, when performed                         (separate procedure) Diagnosis Code(s):     --- Professional ---                        K21.9, Gastro-esophageal reflux disease without                         esophagitis CPT copyright 2022 American Medical Association. All rights reserved. The codes documented in this report are preliminary and upon coder review may  be revised to meet current compliance requirements. Wyline Mood, MD Wyline Mood MD, MD 11/22/2022 8:57:38 AM This report has been signed electronically. Number of Addenda: 0 Note Initiated On: 11/22/2022 8:46 AM Estimated Blood Loss:  Estimated blood loss: none.      [facility]

## 2022-11-22 NOTE — Op Note (Signed)
[facility] Gastroenterology Patient Name: [place] Mcpherson Procedure Date: 11/22/2022 8:46 AM MRN: [id number] Account #: [id number] Date of Birth: 23-Dec-1975 Admit Type: Outpatient Age: 47 Room: [facility] ENDO ROOM 3 Gender: Female Note Status: Finalized Instrument Name: Prentice Docker [id number] Procedure:             Colonoscopy Indications:           Screening for colorectal malignant neoplasm Providers:             Wyline Mood MD, MD Referring MD:          Wyline Mood MD, MD (Referring MD), Nicki Guadalajara                         (Referring MD) Medicines:             Monitored Anesthesia Care Complications:         No immediate complications. Procedure:             Pre-Anesthesia Assessment:                        - Prior to the procedure, a History and Physical was                         performed, and patient medications, allergies and                         sensitivities were reviewed. The patient's tolerance                         of previous anesthesia was reviewed.                        - The risks and benefits of the procedure and the                         sedation options and risks were discussed with the                         patient. All questions were answered and informed                         consent was obtained.                        - ASA Grade Assessment: II - A patient with mild                         systemic disease.                        After obtaining informed consent, the colonoscope was                         passed under direct vision. Throughout the procedure,                         the patient's blood pressure, pulse, and oxygen                         saturations were monitored  continuously. The                         Colonoscope was introduced through the anus and                         advanced to the the cecum, identified by the                         appendiceal orifice. The colonoscopy was performed                          with ease. The patient tolerated the procedure well.                         The quality of the bowel preparation was excellent.                         The ileocecal valve, appendiceal orifice, and rectum                         were photographed. Findings:      The perianal and digital rectal examinations were normal.      Two sessile polyps were found in the ascending colon. The polyps were 3       to 4 mm in size. These polyps were removed with a jumbo cold forceps.       Resection and retrieval were complete.      The exam was otherwise without abnormality on direct and retroflexion       views. Impression:            - Two 3 to 4 mm polyps in the ascending colon, removed                         with a jumbo cold forceps. Resected and retrieved.                        - The examination was otherwise normal on direct and                         retroflexion views. Recommendation:        - Discharge patient to home (with escort).                        - Resume previous diet.                        - Continue present medications.                        - Await pathology results.                        - Repeat colonoscopy for surveillance based on                         pathology results. Procedure Code(s):     --- Professional ---  21308, Colonoscopy, flexible; with biopsy, single or                         multiple Diagnosis Code(s):     --- Professional ---                        Z12.11, Encounter for screening for malignant neoplasm                         of colon                        D12.2, Benign neoplasm of ascending colon CPT copyright 2022 American Medical Association. All rights reserved. The codes documented in this report are preliminary and upon coder review may  be revised to meet current compliance requirements. Wyline Mood, MD Wyline Mood MD, MD 11/22/2022 9:19:19 AM This report has been signed electronically. Number of Addenda: 0 Note  Initiated On: 11/22/2022 8:46 AM Scope Withdrawal Time: 0 hours 10 minutes 55 seconds  Total Procedure Duration: 0 hours 18 minutes 54 seconds  Estimated Blood Loss:  Estimated blood loss: none.      [facility]

## 2022-11-22 NOTE — H&P (Signed)
[place], MD [address], [address], [place], [place], [address] [address], [place], [place], [address] Phone: [phone]  Fax: [phone]  Primary Care Physician:  Ronnald Ramp, MD   Pre-Procedure History & Physical: HPI:  [place] Mcpherson is a 47 y.o. female is here for an endoscopy and colonoscopy    Past Medical History:  Diagnosis Date   GERD (gastroesophageal reflux disease)    Hypertension    IBS (irritable bowel syndrome)    Kidney stone     Past Surgical History:  Procedure Laterality Date   CRYOABLATION  2010   TUBAL LIGATION  2007   VAGINAL HYSTERECTOMY  2013   Dr. Logan Bores [facility])    Prior to Admission medications   Medication Sig Start Date End Date Taking? Authorizing Provider  amitriptyline (ELAVIL) 25 MG tablet Take 25 mg by mouth at bedtime. 09/19/22   [provider]  hydrocortisone (ANUSOL-HC) 2.5 % rectal cream Place 1 Application rectally 2 (two) times daily. 10/05/22   [name], PA-C  ibuprofen (ADVIL) 600 MG tablet Take 1 tablet (600 mg total) by mouth every 8 (eight) hours as needed for moderate pain. 04/18/22   Simmons-Robinson, Makiera, MD  losartan-hydrochlorothiazide (HYZAAR) 50-12.5 MG tablet Take 1 tablet by mouth daily. 05/28/21   Bing Neighbors, NP  ondansetron (ZOFRAN-ODT) 4 MG disintegrating tablet Take 1 tablet (4 mg total) by mouth every 8 (eight) hours as needed. 10/27/22   Henderly, Britni A, PA-C  rOPINIRole (REQUIP) 0.5 MG tablet Take 0.5 tablets (0.25 mg total) by mouth at bedtime for 3 days, THEN 1 tablet (0.5 mg total) at bedtime for 27 days. Patient not taking: Reported on 11/22/2022 11/16/22 12/16/22  Ronnald Ramp, MD    Allergies as of 10/05/2022 - Review Complete 10/05/2022  Allergen Reaction Noted   Sulfa antibiotics Anaphylaxis and Other (See Comments) 04/18/2017    Family History  Problem Relation Age of Onset   Cancer Mother        Pancreatic Cancer   Diabetes  Mother    Hypertension Mother    Diabetes Father    Hypertension Father    Hypertension Maternal Grandmother    Diabetes Maternal Grandmother    Hypertension Maternal Grandfather    Hypertension Paternal Grandfather    Bladder Cancer Neg Hx    Kidney cancer Neg Hx     Social History   Socioeconomic History   Marital status: Single    Spouse name: Not on file   Number of children: Not on file   Years of education: Not on file   Highest education level: Not on file  Occupational History   Not on file  Tobacco Use   Smoking status: Never    Passive exposure: Never   Smokeless tobacco: Never  Vaping Use   Vaping status: Never Used  Substance and Sexual Activity   Alcohol use: No   Drug use: No   Sexual activity: Yes    Birth control/protection: Surgical  Other Topics Concern   Not on file  Social History Narrative   Not on file   Social Determinants of Health   Financial Resource Strain: Low Risk  (04/13/2022)   Overall Financial Resource Strain (CARDIA)    Difficulty of Paying Living Expenses: Not very hard  Food Insecurity: No Food Insecurity (04/13/2022)   Hunger Vital Sign    Worried About Running Out of Food in the Last Year: Never true    Ran Out of Food  in the Last Year: Never true  Transportation Needs: No Transportation Needs (04/13/2022)   PRAPARE - Administrator, Civil Service (Medical): No    Lack of Transportation (Non-Medical): No  Physical Activity: Not on file  Stress: Not on file  Social Connections: Not on file  Intimate Partner Violence: Not At Risk (04/13/2022)   Humiliation, Afraid, Rape, and Kick questionnaire    Fear of Current or Ex-Partner: No    Emotionally Abused: No    Physically Abused: No    Sexually Abused: No    Review of Systems: See HPI, otherwise negative ROS  Physical Exam: BP (!) 146/97   Pulse 76   Temp 97.6 F (36.4 C) (Temporal)   Resp 18   Ht 5\' 4"  (1.626 m)   Wt 82.6 kg   SpO2 100%   BMI 31.24 kg/m   General:   Alert,  pleasant and cooperative in NAD Head:  Normocephalic and atraumatic. Neck:  Supple; no masses or thyromegaly. Lungs:  Clear throughout to auscultation, normal respiratory effort.    Heart:  +S1, +S2, Regular rate and rhythm, No edema. Abdomen:  Soft, nontender and nondistended. Normal bowel sounds, without guarding, and without rebound.   Neurologic:  Alert and  oriented x4;  grossly normal neurologically.  Impression/Plan: [place] Mcpherson is here for an endoscopy and colonoscopy  to be performed for  evaluation of gerd and colon cancer screening     Risks, benefits, limitations, and alternatives regarding endoscopy have been reviewed with the patient.  Questions have been answered.  All parties agreeable.   [place] Mood, MD  11/22/2022, 8:15 AM

## 2022-11-22 NOTE — Anesthesia Procedure Notes (Signed)
Procedure Name: General with mask airway Date/Time: 11/22/2022 9:00 AM  Performed by: Lily Lovings, CRNAPre-anesthesia Checklist: Patient identified, Emergency Drugs available, Suction available, Patient being monitored and Timeout performed Patient Re-evaluated:Patient Re-evaluated prior to induction Oxygen Delivery Method: Simple face mask Preoxygenation: Pre-oxygenation with 100% oxygen

## 2022-11-22 NOTE — Anesthesia Preprocedure Evaluation (Signed)
Anesthesia Evaluation  Patient identified by MRN, date of birth, ID band Patient awake    Reviewed: Allergy & Precautions, NPO status , Patient's Chart, lab work & pertinent test results  History of Anesthesia Complications Negative for: history of anesthetic complications  Airway Mallampati: III  TM Distance: >3 FB Neck ROM: full    Dental no notable dental hx.    Pulmonary neg pulmonary ROS   Pulmonary exam normal        Cardiovascular hypertension, On Medications negative cardio ROS Normal cardiovascular exam     Neuro/Psych  PSYCHIATRIC DISORDERS Anxiety Depression    negative neurological ROS     GI/Hepatic Neg liver ROS,GERD  Medicated,,  Endo/Other  negative endocrine ROS    Renal/GU Renal disease (stones)  negative genitourinary   Musculoskeletal   Abdominal   Peds  Hematology  (+) Blood dyscrasia, anemia   Anesthesia Other Findings Past Medical History: No date: GERD (gastroesophageal reflux disease) No date: Hypertension No date: IBS (irritable bowel syndrome) No date: Kidney stone  Past Surgical History: 2010: CRYOABLATION 2007: TUBAL LIGATION 2013: VAGINAL HYSTERECTOMY     Comment:  Dr. Logan Bores [facility]  BMI    Body Mass Index: 31.24 kg/m      Reproductive/Obstetrics negative OB ROS                             Anesthesia Physical Anesthesia Plan  ASA: 2  Anesthesia Plan: General   Post-op Pain Management: Minimal or no pain anticipated   Induction: Intravenous  PONV Risk Score and Plan: 2 and Propofol infusion and TIVA  Airway Management Planned: Natural Airway and Nasal Cannula  Additional Equipment:   Intra-op Plan:   Post-operative Plan:   Informed Consent: I have reviewed the patients History and Physical, chart, labs and discussed the procedure including the risks, benefits and alternatives for the proposed anesthesia with the patient or  authorized representative who has indicated his/her understanding and acceptance.     Dental Advisory Given  Plan Discussed with: Anesthesiologist, CRNA and Surgeon  Anesthesia Plan Comments: (Patient consented for risks of anesthesia including but not limited to:  - adverse reactions to medications - risk of airway placement if required - damage to eyes, teeth, lips or other oral mucosa - nerve damage due to positioning  - sore throat or hoarseness - Damage to heart, brain, nerves, lungs, other parts of body or loss of life  Patient voiced understanding.)       Anesthesia Quick Evaluation

## 2022-11-22 NOTE — Anesthesia Postprocedure Evaluation (Signed)
Anesthesia Post Note  Patient: [name]  Procedure(s) Performed: COLONOSCOPY WITH PROPOFOL ESOPHAGOGASTRODUODENOSCOPY (EGD) WITH PROPOFOL POLYPECTOMY  Patient location during evaluation: Endoscopy Anesthesia Type: General Level of consciousness: awake and alert Pain management: pain level controlled Vital Signs Assessment: post-procedure vital signs reviewed and stable Respiratory status: spontaneous breathing, nonlabored ventilation, respiratory function stable and patient connected to nasal cannula oxygen Cardiovascular status: blood pressure returned to baseline and stable Postop Assessment: no apparent nausea or vomiting Anesthetic complications: no   No notable events documented.   Last Vitals:  Vitals:   11/22/22 0941 11/22/22 0943  BP: (!) 134/92 (!) 145/98  Pulse: 70 69  Resp: 17 14  Temp:    SpO2: 99% 100%    Last Pain:  Vitals:   11/22/22 0749  TempSrc: Temporal  PainSc: 0-No pain                 Ashley Mcpherson

## 2022-11-23 ENCOUNTER — Ambulatory Visit: Payer: Medicaid Other | Attending: Cardiology | Admitting: Cardiology

## 2022-11-23 ENCOUNTER — Ambulatory Visit: Payer: Medicaid Other

## 2022-11-23 ENCOUNTER — Encounter: Payer: Self-pay | Admitting: Cardiology

## 2022-11-23 VITALS — BP 120/84 | HR 63 | Ht 64.0 in | Wt 183.6 lb

## 2022-11-23 DIAGNOSIS — R002 Palpitations: Secondary | ICD-10-CM

## 2022-11-23 DIAGNOSIS — R0609 Other forms of dyspnea: Secondary | ICD-10-CM

## 2022-11-23 DIAGNOSIS — I10 Essential (primary) hypertension: Secondary | ICD-10-CM

## 2022-11-23 DIAGNOSIS — R072 Precordial pain: Secondary | ICD-10-CM

## 2022-11-23 MED ORDER — METOPROLOL TARTRATE 50 MG PO TABS
50.0000 mg | ORAL_TABLET | Freq: Once | ORAL | 0 refills | Status: DC
Start: 2022-11-23 — End: 2023-01-04

## 2022-11-23 MED ORDER — FUROSEMIDE 20 MG PO TABS: 20.0000 mg | ORAL_TABLET | Freq: Every day | ORAL | 0 refills | Status: DC | PRN

## 2022-11-23 NOTE — Patient Instructions (Addendum)
Medication Instructions:   START Lasix - Take one tablet (20mg ) as needed for swelling  *If you need a refill on your cardiac medications before your next appointment, please call your pharmacy*   Lab Work:  Your provider recommends you have lab work today for the Cardiac CT  If you have labs (blood work) drawn today and your tests are completely normal, you will receive your results only by: MyChart Message (if you have MyChart) OR A paper copy in the mail If you have any lab test that is abnormal or we need to change your treatment, we will call you to review the results.   Testing/Procedures:  Your physician has requested that you have an echocardiogram. Echocardiography is a painless test that uses sound waves to create images of your heart. It provides your doctor with information about the size and shape of your heart and how well your heart's chambers and valves are working. This procedure takes approximately one hour. There are no restrictions for this procedure. Please do NOT wear cologne, perfume, aftershave, or lotions (deodorant is allowed). Please arrive 15 minutes prior to your appointment time.  Your physician has recommended that you wear a Zio monitor.   This monitor is a medical device that records the heart's electrical activity. Doctors most often use these monitors to diagnose arrhythmias. Arrhythmias are problems with the speed or rhythm of the heartbeat. The monitor is a small device applied to your chest. You can wear one while you do your normal daily activities. While wearing this monitor if you have any symptoms to push the button and record what you felt. Once you have worn this monitor for the period of time provider prescribed (Usually 14 days), you will return the monitor device in the postage paid box. Once it is returned they will download the data collected and provide [place] with a report which the provider will then review and we will call you with those  results. Important tips:  Avoid showering during the first 24 hours of wearing the monitor. Avoid excessive sweating to help maximize wear time. Do not submerge the device, no hot tubs, and no swimming pools. Keep any lotions or oils away from the patch. After 24 hours you may shower with the patch on. Take brief showers with your back facing the shower head.  Do not remove patch once it has been placed because that will interrupt data and decrease adhesive wear time. Push the button when you have any symptoms and write down what you were feeling. Once you have completed wearing your monitor, remove and place into box which has postage paid and place in your outgoing mailbox.  If for some reason you have misplaced your box then call our office and we can provide another box and/or mail it off for you.     Your cardiac CT will be scheduled at one of the below locations:   [facility] [address] [place], [place] [address] [phone]  OR  [facility] [address] [place], [place] [address] [phone]  OR   [facility] Vascular entrance [address] [place], [place] [address] [phone]  Please follow these instructions carefully (unless otherwise directed):  An IV will be required for this test and Nitroglycerin will be given.  Hold all erectile dysfunction medications at least 3 days (72 hrs) prior to test. (Ie viagra, cialis, sildenafil, tadalafil, etc)   On the Night Before the  Test: Be sure to Drink plenty of water. Do not consume any caffeinated/decaffeinated beverages or chocolate 12 hours prior to your test. Do not take any antihistamines 12 hours prior to your test.  On the Day of the Test: Drink plenty of water until 1 hour prior to the test. Do not eat any food 1 hour prior to test. You may take your regular medications prior to the test.  Take metoprolol (Lopressor) two hours  prior to test. HOLD Furosemide on the morning of the test. FEMALES- please wear underwire-free bra if available, avoid dresses & tight clothing  After the Test: Drink plenty of water. After receiving IV contrast, you may experience a mild flushed feeling. This is normal. On occasion, you may experience a mild rash up to 24 hours after the test. This is not dangerous. If this occurs, you can take Benadryl 25 mg and increase your fluid intake. If you experience trouble breathing, this can be serious. If it is severe call 911 IMMEDIATELY. If it is mild, please call our office. If you take any of these medications: Glipizide/Metformin, Avandament, Glucavance, please do not take 48 hours after completing test unless otherwise instructed.  We will call to schedule your test 2-4 weeks out understanding that some insurance companies will need an authorization prior to the service being performed.   For more information and frequently asked questions, please visit our website : [email or web address]  For non-scheduling related questions, please contact the cardiac imaging nurse navigator should you have any questions/concerns: Cardiac Imaging Nurse Navigators Direct Office Dial: [phone]   For scheduling needs, including cancellations and rescheduling, please call [place], [phone].   Follow-Up: At [facility], you and your health needs are our priority.  As part of our continuing mission to provide you with exceptional heart care, we have created designated Provider Care Teams.  These Care Teams include your primary Cardiologist (physician) and Advanced Practice Providers (APPs -  Physician Assistants and Nurse Practitioners) who all work together to provide you with the care you need, when you need it.  We recommend signing up for the patient portal called "MyChart".  Sign up information is provided on this After Visit Summary.  MyChart is used to connect with  patients for Virtual Visits (Telemedicine).  Patients are able to view lab/test results, encounter notes, upcoming appointments, etc.  Non-urgent messages can be sent to your provider as well.   To learn more about what you can do with MyChart, go to [email or web address].    Your next appointment:    After Cardiac testing  Provider:   You may see Debbe Odea, MD or one of the following Advanced Practice Providers on your designated Care Team:   Nicolasa Ducking, NP Eula Listen, PA-C Cadence Fransico Michael, PA-C Charlsie Quest, NP

## 2022-11-23 NOTE — Progress Notes (Signed)
Cardiology Office Note:    Date:  11/23/2022   ID:  Ashley Mcpherson, DOB 11-11-1975, MRN [id number]  PCP:  Ronnald Ramp, MD    HeartCare Providers Cardiologist:  Debbe Odea, MD     Referring MD: Brett Albino*   Chief Complaint  Patient presents with   New Patient (Initial Visit)    Referred for cardiac evaluation peripheral edema and palpitations with no cardiac history.  Patient reports edema has improved but still has palpitations.      History of Present Illness:    Ashley Mcpherson is a 47 y.o. female with a hx of hypertension, restless leg syndrome who presents due to leg edema, palpitations and shortness of breath.  States having shortness of breath ongoing over the past several months.  Do not usually occur with exertion.  Noticed leg swelling over a 2-day.  Last week prompting her to go to the ED.  Workup was unrevealing, given oral Lasix with improvement in swelling.  She endorses 2 pillow orthopnea over the last 6 to 8 months.  Denies any family history of heart disease.  Endorse occasional palpitations lasting a few seconds.  Denies dizziness, presyncope or syncope.  Endorses occasional chest pain, not associated with exertion.  Past Medical History:  Diagnosis Date   GERD (gastroesophageal reflux disease)    Hypertension    IBS (irritable bowel syndrome)    Kidney stone     Past Surgical History:  Procedure Laterality Date   CRYOABLATION  2010   TUBAL LIGATION  2007   VAGINAL HYSTERECTOMY  2013   Dr. Logan Bores [facility])    Current Medications: Current Meds  Medication Sig   amitriptyline (ELAVIL) 25 MG tablet Take 25 mg by mouth at bedtime.   furosemide (LASIX) 20 MG tablet Take 1 tablet (20 mg total) by mouth daily as needed.   ibuprofen (ADVIL) 600 MG tablet Take 1 tablet (600 mg total) by mouth every 8 (eight) hours as needed for moderate pain.   losartan-hydrochlorothiazide (HYZAAR) 50-12.5 MG tablet Take 1 tablet by mouth  daily.   metoprolol tartrate (LOPRESSOR) 50 MG tablet Take 1 tablet (50 mg total) by mouth once for 1 dose. ONE TO TWO HOURS PRIOR TO CARDIAC CT   ondansetron (ZOFRAN-ODT) 4 MG disintegrating tablet Take 1 tablet (4 mg total) by mouth every 8 (eight) hours as needed.     Allergies:   Sulfa antibiotics   Social History   Socioeconomic History   Marital status: Single    Spouse name: Not on file   Number of children: Not on file   Years of education: Not on file   Highest education level: Not on file  Occupational History   Not on file  Tobacco Use   Smoking status: Never    Passive exposure: Never   Smokeless tobacco: Never  Vaping Use   Vaping status: Never Used  Substance and Sexual Activity   Alcohol use: No   Drug use: No   Sexual activity: Yes    Birth control/protection: Surgical  Other Topics Concern   Not on file  Social History Narrative   Not on file   Social Determinants of Health   Financial Resource Strain: Low Risk  (04/13/2022)   Overall Financial Resource Strain (CARDIA)    Difficulty of Paying Living Expenses: Not very hard  Food Insecurity: No Food Insecurity (04/13/2022)   Hunger Vital Sign    Worried About Running Out of Food in the Last Year: Never true  Ran Out of Food in the Last Year: Never true  Transportation Needs: No Transportation Needs (04/13/2022)   PRAPARE - Administrator, Civil Service (Medical): No    Lack of Transportation (Non-Medical): No  Physical Activity: Not on file  Stress: Not on file  Social Connections: Not on file     Family History: The patient's family history includes Cancer in her mother; Diabetes in her father, maternal grandmother, and mother; Hypertension in her father, maternal grandfather, maternal grandmother, mother, and paternal grandfather. There is no history of Bladder Cancer or Kidney cancer.  ROS:   Please see the history of present illness.     All other systems reviewed and are  negative.  EKGs/Labs/Other Studies Reviewed:    The following studies were reviewed today:  EKG Interpretation Date/Time:  Thursday November 23 2022 09:47:05 EDT Ventricular Rate:  63 PR Interval:  208 QRS Duration:  88 QT Interval:  446 QTC Calculation: 456 R Axis:   -11  Text Interpretation: Normal sinus rhythm Nonspecific T wave inversions. Confirmed by Debbe Odea ([id number]) on 11/23/2022 9:56:22 AM    Recent Labs: 09/07/2022: TSH 0.697 10/27/2022: ALT 21 11/15/2022: B Natriuretic Peptide 177.9; BUN 11; Creatinine, Ser 0.81; Hemoglobin 9.7; Magnesium 1.9; Platelets 294; Potassium 3.5; Sodium 140  Recent Lipid Panel    Component Value Date/Time   CHOL 232 (H) 09/07/2022 1415   CHOL 198 04/17/2014 1052   TRIG 134 09/07/2022 1415   TRIG 89 04/17/2014 1052   HDL 58 09/07/2022 1415   HDL 66 (H) 04/17/2014 1052   CHOLHDL 4.0 09/07/2022 1415   CHOLHDL 4.2 09/24/2015 1155   VLDL 22 09/24/2015 1155   VLDL 18 04/17/2014 1052   LDLCALC 150 (H) 09/07/2022 1415   LDLCALC 114 (H) 04/17/2014 1052     Risk Assessment/Calculations:             Physical Exam:    VS:  BP 120/84 (BP Location: Right Arm, Patient Position: Sitting, Cuff Size: Large)   Pulse 63   Ht 5\' 4"  (1.626 m)   Wt 183 lb 9.6 oz (83.3 kg)   SpO2 100%   BMI 31.51 kg/m     Wt Readings from Last 3 Encounters:  11/23/22 183 lb 9.6 oz (83.3 kg)  11/22/22 182 lb (82.6 kg)  11/16/22 182 lb (82.6 kg)     GEN:  Well nourished, well developed in no acute distress HEENT: Normal NECK: No JVD; No carotid bruits CARDIAC: RRR, no murmurs, rubs, gallops RESPIRATORY:  Clear to auscultation without rales, wheezing or rhonchi  ABDOMEN: Soft, non-tender, non-distended MUSCULOSKELETAL:  No edema; No deformity  SKIN: Warm and dry NEUROLOGIC:  Alert and oriented x 3 PSYCHIATRIC:  Normal affect   ASSESSMENT:    1. Dyspnea on exertion   2. Palpitations   3. Primary hypertension   4. Precordial pain    PLAN:     In order of problems listed above:  Dyspnea on exertion, occasional chest pain, two-pillow orthopnea.  Obtain echocardiogram to evaluate systolic and diastolic function.  Appears euvolemic today, okay for Lasix 20 mg daily as needed. Palpitations, place cardiac monitor. Hypertension, BP controlled.  Continue Hyzaar. Chest pain, obtain coronary CT.  Follow-up after cardiac testing.     Medication Adjustments/Labs and Tests Ordered: Current medicines are reviewed at length with the patient today.  Concerns regarding medicines are outlined above.  Orders Placed This Encounter  Procedures   CT CORONARY MORPH W/CTA COR W/SCORE W/CA W/CM &/OR  WO/CM   Basic Metabolic Panel (BMET)   CBC   LONG TERM MONITOR (3-14 DAYS)   EKG 12-Lead   ECHOCARDIOGRAM COMPLETE   Meds ordered this encounter  Medications   metoprolol tartrate (LOPRESSOR) 50 MG tablet    Sig: Take 1 tablet (50 mg total) by mouth once for 1 dose. ONE TO TWO HOURS PRIOR TO CARDIAC CT    Dispense:  1 tablet    Refill:  0   furosemide (LASIX) 20 MG tablet    Sig: Take 1 tablet (20 mg total) by mouth daily as needed.    Dispense:  90 tablet    Refill:  0    Patient Instructions  Medication Instructions:   START Lasix - Take one tablet (20mg ) as needed for swelling  *If you need a refill on your cardiac medications before your next appointment, please call your pharmacy*   Lab Work:  Your provider recommends you have lab work today for the Cardiac CT  If you have labs (blood work) drawn today and your tests are completely normal, you will receive your results only by: MyChart Message (if you have MyChart) OR A paper copy in the mail If you have any lab test that is abnormal or we need to change your treatment, we will call you to review the results.   Testing/Procedures:  Your physician has requested that you have an echocardiogram. Echocardiography is a painless test that uses sound waves to create images of  your heart. It provides your doctor with information about the size and shape of your heart and how well your heart's chambers and valves are working. This procedure takes approximately one hour. There are no restrictions for this procedure. Please do NOT wear cologne, perfume, aftershave, or lotions (deodorant is allowed). Please arrive 15 minutes prior to your appointment time.  Your physician has recommended that you wear a Zio monitor.   This monitor is a medical device that records the heart's electrical activity. Doctors most often use these monitors to diagnose arrhythmias. Arrhythmias are problems with the speed or rhythm of the heartbeat. The monitor is a small device applied to your chest. You can wear one while you do your normal daily activities. While wearing this monitor if you have any symptoms to push the button and record what you felt. Once you have worn this monitor for the period of time provider prescribed (Usually 14 days), you will return the monitor device in the postage paid box. Once it is returned they will download the data collected and provide [place] with a report which the provider will then review and we will call you with those results. Important tips:  Avoid showering during the first 24 hours of wearing the monitor. Avoid excessive sweating to help maximize wear time. Do not submerge the device, no hot tubs, and no swimming pools. Keep any lotions or oils away from the patch. After 24 hours you may shower with the patch on. Take brief showers with your back facing the shower head.  Do not remove patch once it has been placed because that will interrupt data and decrease adhesive wear time. Push the button when you have any symptoms and write down what you were feeling. Once you have completed wearing your monitor, remove and place into box which has postage paid and place in your outgoing mailbox.  If for some reason you have misplaced your box then call our office and  we can provide another box and/or mail it  off for you.     Your cardiac CT will be scheduled at one of the below locations:   [facility] [address] [place], [place] [address] [phone]  OR  [facility] [address] [place], [place] [address] [phone]  OR   [facility] Heart and Vascular entrance [address] [place], [place] [address] [phone]  Please follow these instructions carefully (unless otherwise directed):  An IV will be required for this test and Nitroglycerin will be given.  Hold all erectile dysfunction medications at least 3 days (72 hrs) prior to test. (Ie viagra, cialis, sildenafil, tadalafil, etc)   On the Night Before the Test: Be sure to Drink plenty of water. Do not consume any caffeinated/decaffeinated beverages or chocolate 12 hours prior to your test. Do not take any antihistamines 12 hours prior to your test.  On the Day of the Test: Drink plenty of water until 1 hour prior to the test. Do not eat any food 1 hour prior to test. You may take your regular medications prior to the test.  Take metoprolol (Lopressor) two hours prior to test. HOLD Furosemide on the morning of the test. FEMALES- please wear underwire-free bra if available, avoid dresses & tight clothing  After the Test: Drink plenty of water. After receiving IV contrast, you may experience a mild flushed feeling. This is normal. On occasion, you may experience a mild rash up to 24 hours after the test. This is not dangerous. If this occurs, you can take Benadryl 25 mg and increase your fluid intake. If you experience trouble breathing, this can be serious. If it is severe call 911 IMMEDIATELY. If it is mild, please call our office. If you take any of these medications: Glipizide/Metformin, Avandament, Glucavance, please do not take 48 hours after completing test unless otherwise instructed.  We will call to  schedule your test 2-4 weeks out understanding that some insurance companies will need an authorization prior to the service being performed.   For more information and frequently asked questions, please visit our website : [email or web address]  For non-scheduling related questions, please contact the cardiac imaging nurse navigator should you have any questions/concerns: Cardiac Imaging Nurse Navigators Direct Office Dial: [phone]   For scheduling needs, including cancellations and rescheduling, please call [place], [phone].   Follow-Up: At [facility], you and your health needs are our priority.  As part of our continuing mission to provide you with exceptional heart care, we have created designated Provider Care Teams.  These Care Teams include your primary Cardiologist (physician) and Advanced Practice Providers (APPs -  Physician Assistants and Nurse Practitioners) who all work together to provide you with the care you need, when you need it.  We recommend signing up for the patient portal called "MyChart".  Sign up information is provided on this After Visit Summary.  MyChart is used to connect with patients for Virtual Visits (Telemedicine).  Patients are able to view lab/test results, encounter notes, upcoming appointments, etc.  Non-urgent messages can be sent to your provider as well.   To learn more about what you can do with MyChart, go to [email or web address].    Your next appointment:    After Cardiac testing  Provider:   You may see Debbe Odea, MD or one of the following Advanced Practice Providers on your designated Care Team:   Nicolasa Ducking, NP Eula Listen, PA-C Cadence Fransico Michael, PA-C Charlsie Quest, NP    Signed,  Debbe Odea, MD  11/23/2022 11:31 AM    [facility]

## 2022-11-24 LAB — CBC
Hematocrit: 34.3 % (ref 34.0–46.6)
Hemoglobin: 10.2 g/dL — ABNORMAL LOW (ref 11.1–15.9)
MCH: 23.3 pg — ABNORMAL LOW (ref 26.6–33.0)
MCHC: 29.7 g/dL — ABNORMAL LOW (ref 31.5–35.7)
MCV: 78 fL — ABNORMAL LOW (ref 79–97)
Platelets: 325 10*3/uL (ref 150–450)
RBC: 4.38 x10E6/uL (ref 3.77–5.28)
RDW: 15.5 % — ABNORMAL HIGH (ref 11.7–15.4)
WBC: 6.3 10*3/uL (ref 3.4–10.8)

## 2022-11-24 LAB — BASIC METABOLIC PANEL
BUN/Creatinine Ratio: 12 (ref 9–23)
BUN: 9 mg/dL (ref 6–24)
CO2: 24 mmol/L (ref 20–29)
Calcium: 8.6 mg/dL — ABNORMAL LOW (ref 8.7–10.2)
Chloride: 104 mmol/L (ref 96–106)
Creatinine, Ser: 0.78 mg/dL (ref 0.57–1.00)
Glucose: 86 mg/dL (ref 70–99)
Potassium: 4 mmol/L (ref 3.5–5.2)
Sodium: 140 mmol/L (ref 134–144)
eGFR: 94 mL/min/{1.73_m2} (ref >59)

## 2022-11-24 LAB — SURGICAL PATHOLOGY

## 2022-11-27 ENCOUNTER — Encounter: Payer: Self-pay | Admitting: Gastroenterology

## 2022-11-27 DIAGNOSIS — R002 Palpitations: Secondary | ICD-10-CM | POA: Diagnosis not present

## 2022-11-27 DIAGNOSIS — R0609 Other forms of dyspnea: Secondary | ICD-10-CM

## 2022-12-04 ENCOUNTER — Encounter (HOSPITAL_COMMUNITY): Payer: Self-pay

## 2022-12-07 ENCOUNTER — Ambulatory Visit
Admission: RE | Admit: 2022-12-07 | Discharge: 2022-12-07 | Disposition: A | Discharge status: Home / Self Care | Payer: Medicaid Other | Source: Ambulatory Visit | Attending: Cardiology | Admitting: Cardiology

## 2022-12-07 DIAGNOSIS — R072 Precordial pain: Secondary | ICD-10-CM | POA: Diagnosis present

## 2022-12-07 MED ORDER — SODIUM CHLORIDE 0.9 % IV BOLUS
150.0000 mL | Freq: Once | INTRAVENOUS | Status: AC
  Administered 2022-12-07: 150 mL via INTRAVENOUS

## 2022-12-07 MED ORDER — IOHEXOL 350 MG/ML SOLN
100.0000 mL | Freq: Once | INTRAVENOUS | Status: AC | PRN
  Administered 2022-12-07: 100 mL via INTRAVENOUS

## 2022-12-07 MED ORDER — DILTIAZEM HCL 25 MG/5ML IV SOLN: 10.0000 mg | INTRAVENOUS | Status: DC | PRN

## 2022-12-07 MED ORDER — NITROGLYCERIN 0.4 MG SL SUBL
0.8000 mg | SUBLINGUAL_TABLET | Freq: Once | SUBLINGUAL | Status: AC
  Administered 2022-12-07: 0.8 mg via SUBLINGUAL

## 2022-12-07 MED ORDER — METOPROLOL TARTRATE 5 MG/5ML IV SOLN
10.0000 mg | INTRAVENOUS | Status: DC | PRN
  Administered 2022-12-07: 10 mg via INTRAVENOUS

## 2022-12-07 NOTE — Progress Notes (Signed)
Patient tolerated CT well. Drank water after. Vital signs stable encourage to drink water throughout day.Reasons explained and verbalized understanding. Ambulated steady gait.  

## 2022-12-12 ENCOUNTER — Ambulatory Visit: Payer: Medicaid Other | Admitting: Family Medicine

## 2022-12-12 NOTE — Progress Notes (Deleted)
      Established patient visit   Patient: Ashley Mcpherson   DOB: Aug 13, 1975   47 y.o. Female  MRN: [id number] Visit Date: 12/12/2022  Today's healthcare provider: Ronnald Ramp, MD   No chief complaint on file.  Subjective       Discussed the use of AI scribe software for clinical note transcription with the patient, who gave verbal consent to proceed.  History of Present Illness             Past Medical History:  Diagnosis Date   GERD (gastroesophageal reflux disease)    Hypertension    IBS (irritable bowel syndrome)    Kidney stone     Medications: Outpatient Medications Prior to Visit  Medication Sig   amitriptyline (ELAVIL) 25 MG tablet Take 25 mg by mouth at bedtime.   furosemide (LASIX) 20 MG tablet Take 1 tablet (20 mg total) by mouth daily as needed.   ibuprofen (ADVIL) 600 MG tablet Take 1 tablet (600 mg total) by mouth every 8 (eight) hours as needed for moderate pain.   losartan-hydrochlorothiazide (HYZAAR) 50-12.5 MG tablet Take 1 tablet by mouth daily.   metoprolol tartrate (LOPRESSOR) 50 MG tablet Take 1 tablet (50 mg total) by mouth once for 1 dose. ONE TO TWO HOURS PRIOR TO CARDIAC CT   ondansetron (ZOFRAN-ODT) 4 MG disintegrating tablet Take 1 tablet (4 mg total) by mouth every 8 (eight) hours as needed.   No facility-administered medications prior to visit.    Review of Systems  {Insert previous labs (optional):23779} {See past labs  Heme  Chem  Endocrine  Serology  Results Review (optional):1}   Objective    There were no vitals taken for this visit. {Insert last BP/Wt (optional):23777}{See vitals history (optional):1}   Physical Exam  ***  No results found for any visits on 12/12/22.  Assessment & Plan     Problem List Items Addressed This Visit   None   Assessment and Plan              No follow-ups on file.         Ronnald Ramp, MD  [facility] [phone]  (phone) [phone] (fax)  [facility]

## 2022-12-14 ENCOUNTER — Encounter: Payer: Self-pay | Admitting: Physician Assistant

## 2022-12-14 ENCOUNTER — Ambulatory Visit: Payer: Medicaid Other | Attending: Cardiology

## 2022-12-14 ENCOUNTER — Ambulatory Visit: Payer: Medicaid Other | Admitting: Physician Assistant

## 2022-12-14 VITALS — BP 122/77 | HR 74 | Temp 98.2°F | Ht 64.0 in | Wt 181.6 lb

## 2022-12-14 DIAGNOSIS — R0602 Shortness of breath: Secondary | ICD-10-CM | POA: Diagnosis not present

## 2022-12-14 DIAGNOSIS — M7989 Other specified soft tissue disorders: Secondary | ICD-10-CM | POA: Diagnosis not present

## 2022-12-14 DIAGNOSIS — D561 Beta thalassemia: Secondary | ICD-10-CM

## 2022-12-14 DIAGNOSIS — G2581 Restless legs syndrome: Secondary | ICD-10-CM

## 2022-12-14 DIAGNOSIS — R072 Precordial pain: Secondary | ICD-10-CM | POA: Diagnosis not present

## 2022-12-14 DIAGNOSIS — B353 Tinea pedis: Secondary | ICD-10-CM | POA: Diagnosis not present

## 2022-12-14 LAB — ECHOCARDIOGRAM COMPLETE
Area-P 1/2: 3.42 cm2
S' Lateral: 2.4 cm

## 2022-12-15 NOTE — Progress Notes (Unsigned)
Established patient visit  Patient: Ashley Mcpherson   DOB: 1975/08/02   47 y.o. Female  MRN: [id number] Visit Date: 12/14/2022  Today's healthcare provider: Debera Lat, PA-C   Chief Complaint  Patient presents with   Foot Swelling    Follow up on foot swelling in both feet   Subjective    HPI  Discussed the use of AI scribe software for clinical note transcription with the patient, who gave verbal consent to proceed.  History of Present Illness   The patient, with a history of restless leg syndrome and a fungal foot infection, presents for a follow-up visit after a recent ER visit for foot swelling and a racing heart. She reports feeling tired and experiencing shortness of breath. She denies any changes in symptoms since her ER visit. She has not yet seen a podiatrist for her foot infection but has an appointment scheduled. She is currently taking Furosemide (Lasix) and reports feeling much better since starting the medication. She also has a history of beta thalassemia minor and anemia.           12/14/2022    4:20 PM 11/16/2022    1:36 PM 09/07/2022    1:36 PM  Depression screen PHQ 2/9  Decreased Interest 2 2 0  Down, Depressed, Hopeless 2 2 0  PHQ - 2 Score 4 4 0  Altered sleeping 2 2 0  Tired, decreased energy 3 2 3   Change in appetite 2 2 0  Feeling bad or failure about yourself  0 1 0  Trouble concentrating 1 1 0  Moving slowly or fidgety/restless 1 1 0  Suicidal thoughts 0 0 0  PHQ-9 Score 13 13 3   Difficult doing work/chores Not difficult at all Very difficult Not difficult at all      12/14/2022    4:21 PM 11/16/2022    1:37 PM  GAD 7 : Generalized Anxiety Score  Nervous, Anxious, on Edge 3 2  Control/stop worrying 3 3  Worry too much - different things 3 3  Trouble relaxing 1 3  Restless 2 1  Easily annoyed or irritable 3 3  Afraid - awful might happen  3  Total GAD 7 Score  18  Anxiety Difficulty  Very difficult    Medications: Outpatient Medications  Prior to Visit  Medication Sig   amitriptyline (ELAVIL) 25 MG tablet Take 25 mg by mouth at bedtime.   furosemide (LASIX) 20 MG tablet Take 1 tablet (20 mg total) by mouth daily as needed.   ibuprofen (ADVIL) 600 MG tablet Take 1 tablet (600 mg total) by mouth every 8 (eight) hours as needed for moderate pain.   losartan-hydrochlorothiazide (HYZAAR) 50-12.5 MG tablet Take 1 tablet by mouth daily.   ondansetron (ZOFRAN-ODT) 4 MG disintegrating tablet Take 1 tablet (4 mg total) by mouth every 8 (eight) hours as needed.   metoprolol tartrate (LOPRESSOR) 50 MG tablet Take 1 tablet (50 mg total) by mouth once for 1 dose. ONE TO TWO HOURS PRIOR TO CARDIAC CT (Patient not taking: Reported on 12/14/2022)   No facility-administered medications prior to visit.    Review of Systems  All other systems reviewed and are negative.  Except see HPI   {Insert previous labs (optional):23779} {See past labs  Heme  Chem  Endocrine  Serology  Results Review (optional):1}   Objective    BP 122/77 (BP Location: Right Arm, Patient Position: Sitting, Cuff Size: Normal)   Pulse 74   Temp 98.2 F (36.8  C) (Oral)   Ht 5\' 4"  (1.626 m)   Wt 181 lb 9.6 oz (82.4 kg)   SpO2 100%   BMI 31.17 kg/m  {Insert last BP/Wt (optional):23777}{See vitals history (optional):1}   Physical Exam Vitals reviewed.  Constitutional:      General: She is not in acute distress.    Appearance: Normal appearance. She is well-developed. She is not diaphoretic.  HENT:     Head: Normocephalic and atraumatic.  Eyes:     General: No scleral icterus.    Conjunctiva/sclera: Conjunctivae normal.  Neck:     Thyroid: No thyromegaly.  Cardiovascular:     Rate and Rhythm: Normal rate and regular rhythm.     Pulses: Normal pulses.     Heart sounds: Normal heart sounds. No murmur heard. Pulmonary:     Effort: Pulmonary effort is normal. No respiratory distress.     Breath sounds: Normal breath sounds. No wheezing, rhonchi or rales.   Musculoskeletal:     Cervical back: Neck supple.     Right lower leg: No edema.     Left lower leg: No edema.  Lymphadenopathy:     Cervical: No cervical adenopathy.  Skin:    General: Skin is warm and dry.     Findings: No rash.  Neurological:     Mental Status: She is alert and oriented to person, place, and time. Mental status is at baseline.  Psychiatric:        Mood and Affect: Mood normal.        Behavior: Behavior normal.      Results for orders placed or performed in visit on 12/14/22  ECHOCARDIOGRAM COMPLETE  Result Value Ref Range   S' Lateral 2.40 cm   Area-P 1/2 3.42 cm2   Est EF 60 - 65%     Assessment & Plan        Lower Extremity Edema Resolved. Previously evaluated in the ER with normal labs, imaging, and EKG. No current complaints. -Continue current management.  Restless Leg Syndrome On medication. No current complaints. -Continue current management.  Fungal Foot Infection Appointment with podiatry scheduled. -Continue current management.  Shortness of Breath Stable, similar to previous episodes. No acute distress noted on exam. Cardiology follow-up and echocardiogram completed today. -Continue current management. -Await results from cardiology and echocardiogram.  Anemia (Beta Thalassemia Minor) Chronic condition. No acute complaints. -Continue current management.  General Health Maintenance / Followup Plans -Follow-up with primary care in 6 weeks to 3 months. -Follow-up with cardiology as directed. -Follow-up with podiatry on 12/18/2022.      No follow-ups on file.     The patient was advised to call back or seek an in-person evaluation if the symptoms worsen or if the condition fails to improve as anticipated.  I discussed the assessment and treatment plan with the patient. The patient was provided an opportunity to ask questions and all were answered. The patient agreed with the plan and demonstrated an understanding of the  instructions.  I, Debera Lat, PA-C have reviewed all documentation for this visit. The documentation on  12/14/22 for the exam, diagnosis, procedures, and orders are all accurate and complete.  Debera Lat, [facility], MMS [facility] [phone] (phone) [phone] (fax)  [facility]

## 2022-12-16 ENCOUNTER — Encounter: Payer: Self-pay | Admitting: Physician Assistant

## 2022-12-18 ENCOUNTER — Ambulatory Visit: Payer: Medicaid Other | Admitting: Podiatry

## 2023-01-02 ENCOUNTER — Encounter: Payer: Self-pay | Admitting: Podiatry

## 2023-01-02 ENCOUNTER — Ambulatory Visit: Payer: Self-pay | Admitting: Podiatry

## 2023-01-02 VITALS — BP 139/93 | HR 82

## 2023-01-02 DIAGNOSIS — L0889 Other specified local infections of the skin and subcutaneous tissue: Secondary | ICD-10-CM | POA: Diagnosis not present

## 2023-01-02 DIAGNOSIS — B999 Unspecified infectious disease: Secondary | ICD-10-CM

## 2023-01-02 MED ORDER — DOXYCYCLINE HYCLATE 100 MG PO TABS: 100.0000 mg | ORAL_TABLET | Freq: Two times a day (BID) | ORAL | 0 refills | Status: DC

## 2023-01-02 MED ORDER — TERBINAFINE HCL 250 MG PO TABS: 250.0000 mg | ORAL_TABLET | Freq: Every day | ORAL | 0 refills | Status: DC

## 2023-01-02 NOTE — Progress Notes (Signed)
Subjective:  Patient ID: Ashley Mcpherson, female    DOB: October 24, 1975,  MRN: [id number]  Chief Complaint  Patient presents with   Tinea Pedis    "I have athlete's feet really bad.  I've tried everything.  I scratch my feet until they bleed."    47 y.o. female presents with the above complaint.  Patient presents with bilateral fourth and fifth interspace skin infection/superinfection.  Patient is a new present for quite some time is progressive gotten worse she wanted to get it evaluated.  She states she scratches her feet until they bleed.  She has not tried anything for it.  She has tried over-the-counter topical medication which has not helped.  Pain scale is 5 out of 10 dull aching nature.   Review of Systems: Negative except as noted in the HPI. Denies N/V/F/Ch.  Past Medical History:  Diagnosis Date   GERD (gastroesophageal reflux disease)    Hypertension    IBS (irritable bowel syndrome)    Kidney stone     Current Outpatient Medications:    doxycycline (VIBRA-TABS) 100 MG tablet, Take 1 tablet (100 mg total) by mouth 2 (two) times daily., Disp: 28 tablet, Rfl: 0   ibuprofen (ADVIL) 600 MG tablet, Take 1 tablet (600 mg total) by mouth every 8 (eight) hours as needed for moderate pain., Disp: 30 tablet, Rfl: 0   losartan-hydrochlorothiazide (HYZAAR) 50-12.5 MG tablet, Take 1 tablet by mouth daily., Disp: 90 tablet, Rfl: 0   terbinafine (LAMISIL) 250 MG tablet, Take 1 tablet (250 mg total) by mouth daily., Disp: 30 tablet, Rfl: 0   amitriptyline (ELAVIL) 25 MG tablet, Take 25 mg by mouth at bedtime. (Patient not taking: Reported on 01/02/2023), Disp: , Rfl:    furosemide (LASIX) 20 MG tablet, Take 1 tablet (20 mg total) by mouth daily as needed. (Patient not taking: Reported on 01/02/2023), Disp: 90 tablet, Rfl: 0   metoprolol tartrate (LOPRESSOR) 50 MG tablet, Take 1 tablet (50 mg total) by mouth once for 1 dose. ONE TO TWO HOURS PRIOR TO CARDIAC CT (Patient not taking: Reported on  12/14/2022), Disp: 1 tablet, Rfl: 0   ondansetron (ZOFRAN-ODT) 4 MG disintegrating tablet, Take 1 tablet (4 mg total) by mouth every 8 (eight) hours as needed. (Patient not taking: Reported on 01/02/2023), Disp: 20 tablet, Rfl: 0  Social History   Tobacco Use  Smoking Status Never   Passive exposure: Never  Smokeless Tobacco Never    Allergies  Allergen Reactions   Sulfa Antibiotics Anaphylaxis and Other (See Comments)    Mouth sores  Gets sores in the mouth  Other reaction(s): Stomatitis   Objective:   Vitals:   01/02/23 1034  BP: (!) 139/93  Pulse: 82   There is no height or weight on file to calculate BMI. Constitutional Well developed. Well nourished.  Vascular Dorsalis pedis pulses palpable bilaterally. Posterior tibial pulses palpable bilaterally. Capillary refill normal to all digits.  No cyanosis or clubbing noted. Pedal hair growth normal.  Neurologic Normal speech. Oriented to person, place, and time. Epicritic sensation to light touch grossly present bilaterally.  Dermatologic Skin epidermolysis with interdigital maceration noted to fourth and fifth interspace bilaterally.  Subjective component of itching noted.  No open wounds or lesion noted.  Orthopedic: Normal joint ROM without pain or crepitus bilaterally. No visible deformities. No bony tenderness.   Radiographs: None Assessment:   1. Other specified local infections of the skin and subcutaneous tissue   2. Superinfection    Plan:  Patient was evaluated and treated and all questions answered.  Bilateral fourth and fifth local skin infection/superinfection -All questions and concerns were discussed with the patient in extensive detail.  Given the amount of infection was present patient will benefit from 14 days of doxycycline 30 days of Lamisil with Betadine paint in between the toes.  I discussed this with the patient in extensive detail she states understand will do so immediately. -Lamisil  doxycycline was sent to the pharmacy  No follow-ups on file.

## 2023-01-03 NOTE — Progress Notes (Deleted)
Ashley Amy, PA-C [address]  [address]  [place], [place] [address]  Main: [phone]  Fax: [phone]   Primary Care Physician: Ronnald Ramp, MD  Primary Gastroenterologist:  ***  CC:  HPI: AERICA Mcpherson is a 47 y.o. female  Current Outpatient Medications  Medication Sig Dispense Refill   amitriptyline (ELAVIL) 25 MG tablet Take 25 mg by mouth at bedtime. (Patient not taking: Reported on 01/02/2023)     doxycycline (VIBRA-TABS) 100 MG tablet Take 1 tablet (100 mg total) by mouth 2 (two) times daily. 28 tablet 0   furosemide (LASIX) 20 MG tablet Take 1 tablet (20 mg total) by mouth daily as needed. (Patient not taking: Reported on 01/02/2023) 90 tablet 0   ibuprofen (ADVIL) 600 MG tablet Take 1 tablet (600 mg total) by mouth every 8 (eight) hours as needed for moderate pain. 30 tablet 0   losartan-hydrochlorothiazide (HYZAAR) 50-12.5 MG tablet Take 1 tablet by mouth daily. 90 tablet 0   metoprolol tartrate (LOPRESSOR) 50 MG tablet Take 1 tablet (50 mg total) by mouth once for 1 dose. ONE TO TWO HOURS PRIOR TO CARDIAC CT (Patient not taking: Reported on 12/14/2022) 1 tablet 0   ondansetron (ZOFRAN-ODT) 4 MG disintegrating tablet Take 1 tablet (4 mg total) by mouth every 8 (eight) hours as needed. (Patient not taking: Reported on 01/02/2023) 20 tablet 0   terbinafine (LAMISIL) 250 MG tablet Take 1 tablet (250 mg total) by mouth daily. 30 tablet 0   No current facility-administered medications for this visit.    Allergies as of 01/05/2023 - Review Complete 01/02/2023  Allergen Reaction Noted   Sulfa antibiotics Anaphylaxis and Other (See Comments) 04/18/2017    Past Medical History:  Diagnosis Date   GERD (gastroesophageal reflux disease)    Hypertension    IBS (irritable bowel syndrome)    Kidney stone     Past Surgical History:  Procedure Laterality Date   COLONOSCOPY WITH PROPOFOL N/A 11/22/2022   Procedure: COLONOSCOPY WITH PROPOFOL;   Surgeon: Wyline Mood, MD;  Location[facility] ENDOSCOPY;  Service: Gastroenterology;  Laterality: N/A;   CRYOABLATION  2010   ESOPHAGOGASTRODUODENOSCOPY (EGD) WITH PROPOFOL N/A 11/22/2022   Procedure: ESOPHAGOGASTRODUODENOSCOPY (EGD) WITH PROPOFOL;  Surgeon: Wyline Mood, MD;  Location: [facility] ENDOSCOPY;  Service: Gastroenterology;  Laterality: N/A;   POLYPECTOMY  11/22/2022   Procedure: POLYPECTOMY;  Surgeon: Wyline Mood, MD;  Location: [facility] ENDOSCOPY;  Service: Gastroenterology;;   TUBAL LIGATION  2007   VAGINAL HYSTERECTOMY  2013   Dr. Logan Bores [facility])    Review of Systems:    All systems reviewed and negative except where noted in HPI.   Physical Examination:   There were no vitals taken for this visit.  General: Well-nourished, well-developed in no acute distress.  Lungs: Clear to auscultation bilaterally. Non-labored. Heart: Regular rate and rhythm, no murmurs rubs or gallops.  Abdomen: Bowel sounds are normal; Abdomen is Soft; No hepatosplenomegaly, masses or hernias;  No Abdominal Tenderness; No guarding or rebound tenderness. Neuro: Alert and oriented x 3.  Grossly intact.  Psych: Alert and cooperative, normal mood and affect.   Imaging Studies: CT CORONARY MORPH W/CTA COR W/SCORE W/CA W/CM &/OR WO/CM  Addendum Date: 12/28/2022   ADDENDUM REPORT: 12/28/2022 08:07 EXAM: OVER-READ INTERPRETATION  CT CHEST The following report is an over-read performed by radiologist Dr. Curly Shores [facility] on 12/28/2022. This over-read does not include interpretation of cardiac or coronary anatomy or pathology. The coronary CTA interpretation by the  cardiologist is attached. COMPARISON:  None. FINDINGS: Cardiovascular:  See findings discussed in the body of the report. Mediastinum/Nodes: No suspicious adenopathy identified. Imaged mediastinal structures are unremarkable. Lungs/Pleura: Imaged lungs are clear. No pleural effusion or pneumothorax. Upper Abdomen: No acute abnormality.  Musculoskeletal: No chest wall abnormality. No acute osseous findings. IMPRESSION: No significant extracardiac incidental findings identified. Electronically Signed   By: Layla Maw M.D.   On: 12/28/2022 08:07   Result Date: 12/28/2022 CLINICAL DATA:  Chest pain EXAM: Cardiac/Coronary  CTA TECHNIQUE: The patient was scanned on a Siemens Somatom go.Top scanner. : A retrospective scan was triggered in the ascending thoracic aorta. Axial non-contrast 3 mm slices were carried out through the heart. The data set was analyzed on a dedicated work station and scored using the Agatson method. Gantry rotation speed was 330 msecs and collimation was .6 mm. 50mg  of metoprolol and 0.8 mg of sl NTG was given. The 3D data set was reconstructed in 5% intervals of the 60-95 % of the R-R cycle. Diastolic phases were analyzed on a dedicated work station using MPR, MIP and VRT modes. The patient received 100 cc of contrast. FINDINGS: Aorta:  Normal size.  No calcifications.  No dissection. Aortic Valve:  Trileaflet.  No calcifications. Coronary Arteries:  Normal coronary origin.  Right dominance. RCA is a dominant artery. There is no plaque. Left main gives rise to LAD and LCX arteries. LM has no disease. LAD has no plaque. LCX is a non-dominant artery.  There is no plaque. Other findings: Normal pulmonary vein drainage into the left atrium. Normal left atrial appendage without a thrombus. Normal size of the pulmonary artery. IMPRESSION: 1. Coronary calcium score of 0. 2. Normal coronary origin with right dominance. 3. No evidence of CAD. 4. CAD-RADS 0. Consider non-atherosclerotic causes of chest pain. Electronically Signed: By: Debbe Odea M.D. On: 12/07/2022 16:05   LONG TERM MONITOR (3-14 DAYS)  Result Date: 12/19/2022 Patch Wear Time:  13 days and 17 hours (2024-09-16T06:59:54-0400 to 2024-09-30T00:01:23-0400) Patient had a min HR of 61 bpm, max HR of 141 bpm, and avg HR of 84 bpm. Predominant underlying rhythm  was Sinus Rhythm. Isolated SVEs were rare (<1.0%), SVE Couplets were rare (<1.0%), and SVE Triplets were rare (<1.0%). Isolated VEs were rare (<1.0%), and no VE Couplets or VE Triplets were present. Ventricular Bigeminy was present. Conclusion No arrhythmias noted. No atrial fibrillation no atrial flutter. Overall benign cardiac monitor.   ECHOCARDIOGRAM COMPLETE  Result Date: 12/14/2022    ECHOCARDIOGRAM REPORT   Patient Name:   MATISON STONEBACK Date of Exam: 12/14/2022 Medical Rec #:  [id number]       Height:       64.0 in Accession #:    [id number]      Weight:       183.6 lb Date of Birth:  Apr 18, 1975        BSA:          1.887 m Patient Age:    47 years        BP:           120/84 mmHg Patient Gender: F               HR:           80 bpm. Exam Location:   Procedure: 2D Echo, 3D Echo, Cardiac Doppler, Color Doppler and Strain Analysis Indications:    R07.9* Chest pain, unspecified  History:        Patient  has no prior history of Echocardiogram examinations.                 Signs/Symptoms:Edema, Dyspnea, Chest Pain and Shortness of                 Breath; Risk Factors:Non-Smoker.  Sonographer:    Ilda Mori MHA, BS, RDCS Referring Phys: [id number] BRIAN AGBOR-ETANG IMPRESSIONS  1. Left ventricular ejection fraction, by estimation, is 60 to 65%. The left ventricle has normal function. The left ventricle has no regional wall motion abnormalities. Left ventricular diastolic parameters were normal. The average left ventricular global longitudinal strain is -20.7 %.  2. Right ventricular systolic function is normal. The right ventricular size is normal. There is normal pulmonary artery systolic pressure. The estimated right ventricular systolic pressure is 21.5 mmHg.  3. The mitral valve is normal in structure. No evidence of mitral valve regurgitation. No evidence of mitral stenosis.  4. The aortic valve is tricuspid. Aortic valve regurgitation is not visualized. No aortic stenosis is present.  5. The  inferior vena cava is normal in size with greater than 50% respiratory variability, suggesting right atrial pressure of 3 mmHg. FINDINGS  Left Ventricle: Left ventricular ejection fraction, by estimation, is 60 to 65%. The left ventricle has normal function. The left ventricle has no regional wall motion abnormalities. The average left ventricular global longitudinal strain is -20.7 %. The left ventricular internal cavity size was normal in size. There is no left ventricular hypertrophy. Left ventricular diastolic parameters were normal. Right Ventricle: The right ventricular size is normal. No increase in right ventricular wall thickness. Right ventricular systolic function is normal. There is normal pulmonary artery systolic pressure. The tricuspid regurgitant velocity is 2.03 m/s, and  with an assumed right atrial pressure of 5 mmHg, the estimated right ventricular systolic pressure is 21.5 mmHg. Left Atrium: Left atrial size was normal in size. Right Atrium: Right atrial size was normal in size. Pericardium: There is no evidence of pericardial effusion. Mitral Valve: The mitral valve is normal in structure. No evidence of mitral valve regurgitation. No evidence of mitral valve stenosis. Tricuspid Valve: The tricuspid valve is normal in structure. Tricuspid valve regurgitation is mild . No evidence of tricuspid stenosis. Aortic Valve: The aortic valve is tricuspid. Aortic valve regurgitation is not visualized. No aortic stenosis is present. Pulmonic Valve: The pulmonic valve was normal in structure. Pulmonic valve regurgitation is not visualized. No evidence of pulmonic stenosis. Aorta: The aortic root is normal in size and structure. Venous: The inferior vena cava is normal in size with greater than 50% respiratory variability, suggesting right atrial pressure of 3 mmHg. IAS/Shunts: No atrial level shunt detected by color flow Doppler.  LEFT VENTRICLE PLAX 2D LVIDd:         4.20 cm Diastology LVIDs:         2.40  cm LV e' medial:    7.94 cm/s LV PW:         0.90 cm LV E/e' medial:  10.7 LV IVS:        0.90 cm LV e' lateral:   11.40 cm/s                        LV E/e' lateral: 7.4                         2D Longitudinal Strain  2D Strain GLS Avg:     -20.7 %                         3D Volume EF:                        3D EF:        50 %                        LV EDV:       116 ml                        LV ESV:       58 ml                        LV SV:        58 ml RIGHT VENTRICLE RV S prime:     13.50 cm/s TAPSE (M-mode): 2.6 cm LEFT ATRIUM           Index        RIGHT ATRIUM           Index LA diam:      3.50 cm 1.86 cm/m   RA Area:     17.40 cm LA Vol (A4C): 36.7 ml 19.45 ml/m  RA Volume:   53.60 ml  28.41 ml/m   AORTA Ao Root diam: 2.70 cm Ao Asc diam:  3.00 cm MITRAL VALVE               TRICUSPID VALVE MV Area (PHT): 3.42 cm    TR Peak grad:   16.5 mmHg MV Decel Time: 222 msec    TR Vmax:        203.00 cm/s MV E velocity: 84.90 cm/s MV A velocity: 89.60 cm/s MV E/A ratio:  0.95 Julien Nordmann MD Electronically signed by Julien Nordmann MD Signature Date/Time: 12/14/2022/5:34:44 PM    Final     Assessment and Plan:   Ashley Mcpherson is a 47 y.o. y/o female ***    Ashley Amy, PA-C  Follow up ***  BP check ***

## 2023-01-04 ENCOUNTER — Other Ambulatory Visit: Payer: Self-pay

## 2023-01-04 ENCOUNTER — Ambulatory Visit: Payer: Medicaid Other | Admitting: Cardiology

## 2023-01-05 ENCOUNTER — Ambulatory Visit: Payer: Medicaid Other | Admitting: Physician Assistant

## 2023-01-07 NOTE — Progress Notes (Unsigned)
Cardiology Clinic Note   Date: 01/08/2023 ID: Ashley Mcpherson, DOB 10-Mar-1976, MRN [id number]  Primary Cardiologist:  Debbe Odea, MD  Patient Profile    Ashley Mcpherson is a 47 y.o. female who presents to the clinic today for follow up after testing.     Past medical history significant for: Chest pain/dyspnea/lower extremity edema. Coronary CTA 12/07/2022: Calcium score 0 with no evidence of CAD. Echo 12/14/2022: EF 60 to 65%.  No RWMA.  Normal diastolic parameters.  Normal strain.  Normal RV function.  Normal PA pressure.  No significant valvular abnormalities. Palpitations. 14-day ZIO 12/15/2022: HR 61 to 141 bpm, average 84 bpm.  Predominant underlying rhythm was sinus rhythm.  No arrhythmias noted. Hypertension. Hyperlipidemia. Lipid panel 09/07/2022: LDL 150, HDL 58, TG 134, total 232. Restless leg syndrome.  In summary, patient was first evaluated by Dr. Azucena Cecil on 11/23/2022 for complaints of palpitations and shortness of breath.  She reported a several month history of shortness of breath not usually occurring with exertion.  Over 2 days.  She noticed increased lower extremity edema and went to the ED with an unremarkable workup.  Oral Lasix was provided and improved edema.  She reported two-pillow orthopnea over 6 to 8 months.  Also experiencing occasional palpitations lasting a few seconds without dizziness, presyncope, or syncope.  She also has occasional chest pain not usually related to exertion.  No family history of heart disease.  Coronary CTA showed a calcium score of 0.  Echo showed normal biventricular function without significant valvular abnormalities.  14-day ZIO was normal with no arrhythmias noted.     History of Present Illness    Ashley Mcpherson is followed by Dr. Azucena Cecil for the above outlined history.   Discussed the use of AI scribe software for clinical note transcription with the patient, who gave verbal consent to proceed.  The patient  presents for follow-up after testing. She reports continued dyspnea and palpitations with exertion and during times of stress or anxiety. She also has occasional chest discomfort associated with anxiety.  She has not had any further lower extremity edema. She feels this occurred because she missed some antihypertensive doses.  The patient works a third shift job, which involves a lot of sitting and minimal physical activity. Her job makes it difficult for her to stay active. Discussed results of testing and answered all questions.        ROS: All other systems reviewed and are otherwise negative except as noted in History of Present Illness.  Studies Reviewed    EKG is not ordered today.       Physical Exam    VS:  BP 100/74 (BP Location: Left Arm, Patient Position: Sitting, Cuff Size: Large)   Pulse 76   Ht 5\' 4"  (1.626 m)   Wt 177 lb (80.3 kg)   SpO2 98%   BMI 30.38 kg/m  , BMI Body mass index is 30.38 kg/m.  GEN: Well nourished, well developed, in no acute distress. Neck: No JVD or carotid bruits. Cardiac:  RRR. No murmurs. No rubs or gallops.   Respiratory:  Respirations regular and unlabored. Clear to auscultation without rales, wheezing or rhonchi. GI: Soft, nontender, nondistended. Extremities: Radials/DP/PT 2+ and equal bilaterally. No clubbing or cyanosis. No edema.  Skin: Warm and dry, no rash. Neuro: Strength intact.  Assessment & Plan      Chest pain/dyspnea/lower extremity edema Coronary CTA September 2024 showed calcium score of 0 with no evidence of CAD.  Echo showed normal biventricular function without significant valvular abnormalities.  Patient reports continued dyspnea with exertion or when she is feeling anxious. Occasional chest pain occurs with anxiety. She has had no further lower extremity edema. She feels the edema occurred secondary to missing doses of Hyzaar. She also works a sedentary job, on 3rd shift.  Euvolemic and well compensated on  exam. -Continue Hyzaar. -Restrict sodium.  -Advance physical activity as tolerated.   Palpitations 14-day ZIO showed no significant arrhythmias.  Patient reports palpitations described as heart racing with exertion and when she feels anxious. RRR on exam today.  -Continue to monitor.   Hypertension BP today 100/74.  -Continue Hyzaar.       Disposition: Return as needed.          Signed, Etta Grandchild. Taren Dymek, DNP, NP-C

## 2023-01-08 ENCOUNTER — Encounter: Payer: Self-pay | Admitting: Student

## 2023-01-08 ENCOUNTER — Ambulatory Visit: Payer: Medicaid Other | Attending: Cardiology | Admitting: Student

## 2023-01-08 VITALS — BP 100/74 | HR 76 | Ht 64.0 in | Wt 177.0 lb

## 2023-01-08 DIAGNOSIS — R079 Chest pain, unspecified: Secondary | ICD-10-CM

## 2023-01-08 DIAGNOSIS — R0609 Other forms of dyspnea: Secondary | ICD-10-CM | POA: Diagnosis not present

## 2023-01-08 DIAGNOSIS — R002 Palpitations: Secondary | ICD-10-CM

## 2023-01-08 DIAGNOSIS — I10 Essential (primary) hypertension: Secondary | ICD-10-CM

## 2023-01-08 DIAGNOSIS — R600 Localized edema: Secondary | ICD-10-CM | POA: Diagnosis not present

## 2023-01-08 NOTE — Patient Instructions (Signed)
Medication Instructions:  The current medical regimen is effective;  continue present plan and medications.  *If you need a refill on your cardiac medications before your next appointment, please call your pharmacy*   Follow-Up: At [facility], you and your health needs are our priority.  As part of our continuing mission to provide you with exceptional heart care, we have created designated Provider Care Teams.  These Care Teams include your primary Cardiologist (physician) and Advanced Practice Providers (APPs -  Physician Assistants and Nurse Practitioners) who all work together to provide you with the care you need, when you need it.  We recommend signing up for the patient portal called "MyChart".  Sign up information is provided on this After Visit Summary.  MyChart is used to connect with patients for Virtual Visits (Telemedicine).  Patients are able to view lab/test results, encounter notes, upcoming appointments, etc.  Non-urgent messages can be sent to your provider as well.   To learn more about what you can do with MyChart, go to [email or web address].    Your next appointment:   As needed  Provider:   Carlos Levering, NP

## 2023-01-09 ENCOUNTER — Ambulatory Visit: Payer: Medicaid Other | Admitting: Physician Assistant

## 2023-01-18 NOTE — Progress Notes (Signed)
Ashley Amy, PA-C [address]  [address]  [place], [place] [address]  Main: [phone]  Fax: [phone]   Primary Care Physician: Ashley Ramp, MD  Primary Gastroenterologist:  Ashley Amy, PA-C / Dr. Wyline Mcpherson    CC: Follow-up chronic constipation, IBS, nausea, GERD  HPI: Ashley Mcpherson is a 47 y.o. female returns for 48-month follow-up of chronic constipation, irritable bowel syndrome, nausea, GERD, ventral hernia, and hemorrhoids.  She has seen Dr. Claudine Mcpherson general surgeon to discuss ventral hernia repair.  She has chronic microcytic anemia and history of thalassemia trait.  3 months ago she was switched from Linzess to Ibsrela 50 mg twice daily.  Continued on MiraLAX.  Treated for hemorrhoids.  EGD and colonoscopy were scheduled.  Patient states she never started [place].  Currently she is taking Linzess 290 and MiraLAX 2 capfuls twice daily.  On this treatment her GI symptoms have improved.  Constipation has improved.  She is having bowel movement once or twice per week.  She denies any GI symptoms or concerns today.  EGD 11/22/2022 by Dr. Tobi Mcpherson was normal.  Colonoscopy 11/22/2022 showed 2 small (3 mm and 4 mm) sessile serrated and hyperplastic polyps removed.  Excellent prep.  7-year repeat.  Labs 11/23/2022: showed stable hemoglobin 10.2.,  MCV 78.  Normal BMP. Labs 10/09/2022: Hemoglobin 10.2, normal iron, B12 and folate.  Celiac labs negative.   Current Outpatient Medications  Medication Sig Dispense Refill   amitriptyline (ELAVIL) 25 MG tablet Take 25 mg by mouth at bedtime.     doxycycline (VIBRA-TABS) 100 MG tablet Take 1 tablet (100 mg total) by mouth 2 (two) times daily. (Patient not taking: Reported on 01/08/2023) 28 tablet 0   furosemide (LASIX) 20 MG tablet Take 1 tablet (20 mg total) by mouth daily as needed. (Patient not taking: Reported on 01/02/2023) 90 tablet 0   ibuprofen (ADVIL) 600 MG tablet Take 1 tablet (600 mg total) by mouth  every 8 (eight) hours as needed for moderate pain. 30 tablet 0   losartan-hydrochlorothiazide (HYZAAR) 50-12.5 MG tablet Take 1 tablet by mouth daily. 90 tablet 0   ondansetron (ZOFRAN-ODT) 4 MG disintegrating tablet Take 1 tablet (4 mg total) by mouth every 8 (eight) hours as needed. 20 tablet 0   terbinafine (LAMISIL) 250 MG tablet Take 1 tablet (250 mg total) by mouth daily. (Patient not taking: Reported on 01/08/2023) 30 tablet 0   No current facility-administered medications for this visit.    Allergies as of 01/19/2023 - Review Complete 01/08/2023  Allergen Reaction Noted   Sulfa antibiotics Anaphylaxis and Other (See Comments) 04/18/2017    Past Medical History:  Diagnosis Date   GERD (gastroesophageal reflux disease)    Hypertension    IBS (irritable bowel syndrome)    Kidney stone     Past Surgical History:  Procedure Laterality Date   COLONOSCOPY WITH PROPOFOL N/A 11/22/2022   Procedure: COLONOSCOPY WITH PROPOFOL;  Surgeon: Ashley Mood, MD;  Location: [facility] ENDOSCOPY;  Service: Gastroenterology;  Laterality: N/A;   CRYOABLATION  2010   ESOPHAGOGASTRODUODENOSCOPY (EGD) WITH PROPOFOL N/A 11/22/2022   Procedure: ESOPHAGOGASTRODUODENOSCOPY (EGD) WITH PROPOFOL;  Surgeon: Ashley Mood, MD;  Location: [facility] ENDOSCOPY;  Service: Gastroenterology;  Laterality: N/A;   POLYPECTOMY  11/22/2022   Procedure: POLYPECTOMY;  Surgeon: Ashley Mood, MD;  Location: [facility] ENDOSCOPY;  Service: Gastroenterology;;   TUBAL LIGATION  2007   VAGINAL HYSTERECTOMY  2013   Dr. Logan Mcpherson [facility])    Review of Systems:  All systems reviewed and negative except where noted in HPI.   Physical Examination:   There were no vitals taken for this visit.  General: Well-nourished, well-developed in no acute distress.  Neuro: Alert and oriented x 3.  Grossly intact.  Psych: Alert and cooperative, normal Mcpherson and affect.   Imaging Studies: No results found.  Assessment and Plan:   Ashley Mcpherson is a 47  y.o. y/o female returns for follow-up of:  1.  Chronic idiopathic constipation, irritable bowel syndrome with constipation  Continue Linzess to 90 mcg 1 capsule daily  Continue MiraLAX powder 2 capfuls twice daily  Continue high-fiber diet and 64 ounces of fluids daily  2.  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  Reassurance  Celiac labs negative.  EGD and colonoscopy negative.  3.  History of adenomatous colon polyp  Repeat colonoscopy in 7 years.  Ashley Amy, PA-C  Follow up as needed if recurrent GI symptoms.

## 2023-01-19 ENCOUNTER — Encounter: Payer: Self-pay | Admitting: Physician Assistant

## 2023-01-19 ENCOUNTER — Ambulatory Visit: Payer: Medicaid Other | Admitting: Physician Assistant

## 2023-01-19 ENCOUNTER — Other Ambulatory Visit: Payer: Self-pay

## 2023-01-19 VITALS — BP 124/82 | HR 80 | Temp 97.9°F | Ht 64.0 in | Wt 177.1 lb

## 2023-01-19 DIAGNOSIS — K581 Irritable bowel syndrome with constipation: Secondary | ICD-10-CM | POA: Diagnosis not present

## 2023-01-19 DIAGNOSIS — D569 Thalassemia, unspecified: Secondary | ICD-10-CM

## 2023-01-19 DIAGNOSIS — Z860100 Personal history of colon polyps, unspecified: Secondary | ICD-10-CM

## 2023-01-19 DIAGNOSIS — Z860101 Personal history of adenomatous and serrated colon polyps: Secondary | ICD-10-CM | POA: Diagnosis not present

## 2023-01-19 DIAGNOSIS — K5904 Chronic idiopathic constipation: Secondary | ICD-10-CM | POA: Diagnosis not present

## 2023-01-19 MED ORDER — LINACLOTIDE 290 MCG PO CAPS
290.0000 ug | ORAL_CAPSULE | Freq: Every day | ORAL | 3 refills | Status: DC
Start: 2023-01-19 — End: 2023-01-29

## 2023-01-24 ENCOUNTER — Ambulatory Visit: Payer: Medicaid Other | Admitting: Cardiology

## 2023-01-29 ENCOUNTER — Encounter: Payer: Self-pay | Admitting: Family Medicine

## 2023-01-29 ENCOUNTER — Ambulatory Visit: Payer: Medicaid Other | Admitting: Family Medicine

## 2023-01-29 VITALS — BP 109/75 | HR 80 | Resp 18 | Ht 64.0 in | Wt 182.1 lb

## 2023-01-29 DIAGNOSIS — I10 Essential (primary) hypertension: Secondary | ICD-10-CM | POA: Diagnosis not present

## 2023-01-29 DIAGNOSIS — G4726 Circadian rhythm sleep disorder, shift work type: Secondary | ICD-10-CM | POA: Diagnosis not present

## 2023-01-29 MED ORDER — TRAZODONE HCL 50 MG PO TABS
25.0000 mg | ORAL_TABLET | Freq: Every evening | ORAL | 0 refills | Status: DC | PRN
Start: 2023-01-29 — End: 2023-03-15

## 2023-01-29 NOTE — Progress Notes (Signed)
Established patient visit   Patient: Ashley Mcpherson   DOB: 1975-04-10   48 y.o. Female  MRN: [id number] Visit Date: 01/29/2023  Today's healthcare provider: Ronnald Ramp, MD   Chief Complaint  Patient presents with   Hypertension   Subjective       Discussed the use of AI scribe software for clinical note transcription with the patient, who gave verbal consent to proceed.  History of Present Illness   The patient, a 47 year old female with a history of hypertension and restless leg syndrome, presents for a routine follow-up. She has been on hydrochlorothiazide 12.5mg  and losartan 50mg , which has resulted in well-controlled blood pressure. The patient also takes Requip for restless leg syndrome, which was started during her last visit in September.  The patient reports feeling very tired due to a lack of sleep, averaging only two to three hours of sleep per day due to her work schedule. She has been taking hydroxyzine to aid sleep, but reports that it takes a while to take effect. She has also tried Z-Quil and other sleep aids in the past.  No new health issues were reported. The patient's physical examination was unremarkable, with normal heart sounds and no edema noted in the legs.         Past Medical History:  Diagnosis Date   GERD (gastroesophageal reflux disease)    Hypertension    IBS (irritable bowel syndrome)    Kidney stone     Medications: Outpatient Medications Prior to Visit  Medication Sig   losartan-hydrochlorothiazide (HYZAAR) 50-12.5 MG tablet Take 1 tablet by mouth daily.   [DISCONTINUED] ibuprofen (ADVIL) 600 MG tablet Take 1 tablet (600 mg total) by mouth every 8 (eight) hours as needed for moderate pain.   [DISCONTINUED] linaclotide (LINZESS) 290 MCG CAPS capsule Take 1 capsule (290 mcg total) by mouth daily before breakfast.   [DISCONTINUED] polyethylene glycol (MIRALAX / GLYCOLAX) 17 g packet Take 17 g by mouth 2 (two) times daily.    No facility-administered medications prior to visit.    Review of Systems  Last metabolic panel Lab Results  Component Value Date   GLUCOSE 86 11/23/2022   NA 140 11/23/2022   K 4.0 11/23/2022   CL 104 11/23/2022   CO2 24 11/23/2022   BUN 9 11/23/2022   CREATININE 0.78 11/23/2022   EGFR 94 11/23/2022   CALCIUM 8.6 (L) 11/23/2022   PROT 7.8 10/27/2022   ALBUMIN 3.8 10/27/2022   LABGLOB 3.2 09/07/2022   AGRATIO 1.4 05/10/2022   BILITOT 0.6 10/27/2022   ALKPHOS 62 10/27/2022   AST 21 10/27/2022   ALT 21 10/27/2022   ANIONGAP 7 11/15/2022        Objective    BP 109/75   Pulse 80   Resp 18   Ht 5\' 4"  (1.626 m)   Wt 182 lb 1.6 oz (82.6 kg)   SpO2 98%   BMI 31.26 kg/m  BP Readings from Last 3 Encounters:  01/29/23 109/75  01/19/23 124/82  01/08/23 100/74   Wt Readings from Last 3 Encounters:  01/29/23 182 lb 1.6 oz (82.6 kg)  01/19/23 177 lb 2 oz (80.3 kg)  01/08/23 177 lb (80.3 kg)          Physical Exam  General: Alert, no acute distress Cardio: Normal S1 and S2, RRR, no r/m/g Pulm: CTAB, normal work of breathing Abdomen: Bowel sounds normal. Abdomen soft and non-tender.  Extremities: No peripheral edema.    No  results found for any visits on 01/29/23.  Assessment & Plan     Problem List Items Addressed This Visit       Cardiovascular and Mediastinum   Essential hypertension - Primary    Well-controlled hypertension with current blood pressure at 109/75. On hydrochlorothiazide 12.5 mg and losartan 50 mg. Last metabolic panel in September showed normal creatinine and potassium levels. Emphasized the importance of monitoring kidney function and electrolytes due to medication regimen. - chronic, within goal range  - Continue hydrochlorothiazide 12.5 mg and losartan 50 mg - Order BMP to check potassium and creatinine levels - Schedule annual physical for 09/29/2023      Relevant Orders   BMP8+EGFR     Other   Shift work sleep disorder     Reports severe fatigue due to sleeping only 2-3 hours nightly. Currently taking hydroxyzine, which is effective but slow-acting. Previously tried Z-Quil and other sleep aids. Discussed trazodone 50 mg, recommending 25 mg (half a tablet) before bedtime to improve sleep duration. Explained trazodone's efficacy in achieving 6-7 hours of rest. - Prescribe trazodone 50 mg, instruct to take 25 mg (half a tablet) before bedtime      Relevant Medications   traZODone (DESYREL) 50 MG tablet       Health Maintenance  Routine health maintenance discussed. No new health issues reported. - Schedule annual physical for 09/29/2023.         Return in about 8 months (around 09/28/2023) for CPE.      Ronnald Ramp, MD  [facility] [phone] (phone) [phone] (fax)  [facility]

## 2023-01-29 NOTE — Assessment & Plan Note (Signed)
Reports severe fatigue due to sleeping only 2-3 hours nightly. Currently taking hydroxyzine, which is effective but slow-acting. Previously tried Z-Quil and other sleep aids. Discussed trazodone 50 mg, recommending 25 mg (half a tablet) before bedtime to improve sleep duration. Explained trazodone's efficacy in achieving 6-7 hours of rest. - Prescribe trazodone 50 mg, instruct to take 25 mg (half a tablet) before bedtime

## 2023-01-29 NOTE — Patient Instructions (Signed)
VISIT SUMMARY:  Today, we discussed your ongoing health concerns and made some adjustments to your treatment plan to help improve your sleep and maintain your overall health. Your blood pressure remains well-controlled, and there are no new health issues to report.  YOUR PLAN:  -INSOMNIA: Insomnia is a condition where you have trouble falling or staying asleep. You reported severe fatigue due to only getting 2-3 hours of sleep nightly. We discussed starting trazodone 50 mg, and you should take 25 mg (half a tablet) before bedtime to help you achieve 6-7 hours of rest.  -HYPERTENSION: Hypertension, or high blood pressure, is well-controlled with your current medications, hydrochlorothiazide 12.5 mg and losartan 50 mg. Your last metabolic panel showed normal kidney function and potassium levels. We will continue your current medications and order a basic metabolic panel (BMP) to monitor your kidney function and electrolytes.    -GENERAL HEALTH MAINTENANCE: We discussed routine health maintenance, and there are no new health issues to report. Your annual physical is scheduled for 09/29/2023.  INSTRUCTIONS:  Please take trazodone 25 mg (half a tablet) before bedtime to help with your sleep. We will order a basic metabolic panel (BMP) to check your kidney function and electrolytes. Your annual physical is scheduled for 09/29/2023.

## 2023-01-29 NOTE — Assessment & Plan Note (Signed)
Well-controlled hypertension with current blood pressure at 109/75. On hydrochlorothiazide 12.5 mg and losartan 50 mg. Last metabolic panel in September showed normal creatinine and potassium levels. Emphasized the importance of monitoring kidney function and electrolytes due to medication regimen. - chronic, within goal range  - Continue hydrochlorothiazide 12.5 mg and losartan 50 mg - Order BMP to check potassium and creatinine levels - Schedule annual physical for 09/29/2023

## 2023-01-30 ENCOUNTER — Ambulatory Visit: Payer: Medicaid Other | Admitting: Podiatry

## 2023-01-30 LAB — BMP8+EGFR
BUN/Creatinine Ratio: 16 (ref 9–23)
BUN: 14 mg/dL (ref 6–24)
CO2: 23 mmol/L (ref 20–29)
Calcium: 9.1 mg/dL (ref 8.7–10.2)
Chloride: 101 mmol/L (ref 96–106)
Creatinine, Ser: 0.9 mg/dL (ref 0.57–1.00)
Glucose: 107 mg/dL — ABNORMAL HIGH (ref 70–99)
Potassium: 3.8 mmol/L (ref 3.5–5.2)
Sodium: 140 mmol/L (ref 134–144)
eGFR: 79 mL/min/{1.73_m2} (ref >59)

## 2023-02-06 ENCOUNTER — Other Ambulatory Visit: Payer: Self-pay | Admitting: Family Medicine

## 2023-02-06 NOTE — Telephone Encounter (Signed)
Medication Refill -  Most Recent Primary Care Visit:  Provider: Ronnald Ramp  Department: [facility]  Visit Type: OFFICE VISIT  Date: 01/29/2023  Medication:  losartan-hydrochlorothiazide (HYZAAR) 50-12.5 MG tablet  Has the patient contacted their pharmacy? Yes, advised to contact PCP  Is this the correct pharmacy for this prescription? Yes, the one listed below.  This is the patient's preferred pharmacy:  [id number] - [place], [place] - [address] AT [address] [place] [address] Phone: [phone] Fax: [phone]   Has the prescription been filled recently?  Receipt confirmed by pharmacy (05/28/2021 12:01 PM EDT)   Is the patient out of the medication?  Patient is completely out of medication  Has the patient been seen for an appointment in the last year OR does the patient have an upcoming appointment? Patient was last seen on 11/18//2024 with PCP

## 2023-02-07 MED ORDER — LOSARTAN POTASSIUM-HCTZ 50-12.5 MG PO TABS: 1.0000 | ORAL_TABLET | Freq: Every day | ORAL | 3 refills | Status: DC

## 2023-02-07 NOTE — Telephone Encounter (Signed)
Requested medication (s) are due for refill today: Yes  Requested medication (s) are on the active medication list: Yes  Last refill:  05/28/21  Future visit scheduled: Yes  Notes to clinic:  Last filled by Joaquin Courts NP.    Requested Prescriptions  Pending Prescriptions Disp Refills   losartan-hydrochlorothiazide (HYZAAR) 50-12.5 MG tablet 90 tablet 0    Sig: Take 1 tablet by mouth daily.     Cardiovascular: ARB + Diuretic Combos Passed - 02/06/2023  1:16 PM      Passed - K in normal range and within 180 days    Potassium  Date Value Ref Range Status  01/29/2023 3.8 3.5 - 5.2 mmol/L Final  04/17/2014 3.9 3.5 - 5.1 mmol/L Final         Passed - Na in normal range and within 180 days    Sodium  Date Value Ref Range Status  01/29/2023 140 134 - 144 mmol/L Final  04/17/2014 140 136 - 145 mmol/L Final         Passed - Cr in normal range and within 180 days    Creatinine  Date Value Ref Range Status  04/17/2014 0.81 0.60 - 1.30 mg/dL Final   Creatinine, Ser  Date Value Ref Range Status  01/29/2023 0.90 0.57 - 1.00 mg/dL Final         Passed - eGFR is 10 or above and within 180 days    EGFR (African American)  Date Value Ref Range Status  04/17/2014 >60 >28mL/min Final  11/13/2011 >60  Final   GFR calc Af Amer  Date Value Ref Range Status  02/28/2019 89 >59 mL/min/1.73 Final   EGFR (Non-African Amer.)  Date Value Ref Range Status  04/17/2014 >60 >29mL/min Final    Comment:    eGFR values <75mL/min/1.73 m2 may be an indication of chronic kidney disease (CKD). Calculated eGFR, using the MRDR Study equation, is useful in  patients with stable renal function. The eGFR calculation will not be reliable in acutely ill patients when serum creatinine is changing rapidly. It is not useful in patients on dialysis. The eGFR calculation may not be applicable to patients at the low and high extremes of body sizes, pregnant women, and vegetarians.   11/13/2011 >60   Final    Comment:    eGFR values <82mL/min/1.73 m2 may be an indication of chronic kidney disease (CKD). Calculated eGFR is useful in patients with stable renal function. The eGFR calculation will not be reliable in acutely ill patients when serum creatinine is changing rapidly. It is not useful in  patients on dialysis. The eGFR calculation may not be applicable to patients at the low and high extremes of body sizes, pregnant women, and vegetarians.    GFR, Estimated  Date Value Ref Range Status  11/15/2022 >60 >60 mL/min Final    Comment:    (NOTE) Calculated using the CKD-EPI Creatinine Equation (2021)    eGFR  Date Value Ref Range Status  01/29/2023 79 >59 mL/min/1.73 Final         Passed - Patient is not pregnant      Passed - Last BP in normal range    BP Readings from Last 1 Encounters:  01/29/23 109/75         Passed - Valid encounter within last 6 months    Recent Outpatient Visits           1 week ago Essential hypertension   [facility] [place], [place],  [place]   1 month ago Foot swelling   [facility] [place], [place], [place]   2 months ago Foot swelling   [facility] [place], [place], [place]   4 months ago Annual physical exam   [facility] [place], [place], [place]   5 months ago Essential hypertension   [facility] [place], [place], [place]

## 2023-02-13 ENCOUNTER — Ambulatory Visit: Payer: Medicaid Other | Admitting: Podiatry

## 2023-03-15 ENCOUNTER — Other Ambulatory Visit: Payer: Self-pay | Admitting: Family Medicine

## 2023-03-15 DIAGNOSIS — G4726 Circadian rhythm sleep disorder, shift work type: Secondary | ICD-10-CM

## 2023-03-18 ENCOUNTER — Other Ambulatory Visit: Payer: Self-pay

## 2023-03-18 ENCOUNTER — Emergency Department
Admission: EM | Admit: 2023-03-18 | Discharge: 2023-03-18 | Disposition: A | Discharge status: Home / Self Care | Payer: Managed Care, Other (non HMO) | Attending: Emergency Medicine | Admitting: Emergency Medicine

## 2023-03-18 ENCOUNTER — Emergency Department: Payer: Managed Care, Other (non HMO)

## 2023-03-18 DIAGNOSIS — I10 Essential (primary) hypertension: Secondary | ICD-10-CM | POA: Diagnosis not present

## 2023-03-18 DIAGNOSIS — F4310 Post-traumatic stress disorder, unspecified: Secondary | ICD-10-CM | POA: Insufficient documentation

## 2023-03-18 DIAGNOSIS — R079 Chest pain, unspecified: Secondary | ICD-10-CM | POA: Diagnosis present

## 2023-03-18 LAB — BASIC METABOLIC PANEL
Anion gap: 11 (ref 5–15)
BUN: 12 mg/dL (ref 6–20)
CO2: 28 mmol/L (ref 22–32)
Calcium: 9 mg/dL (ref 8.9–10.3)
Chloride: 99 mmol/L (ref 98–111)
Creatinine, Ser: 0.97 mg/dL (ref 0.44–1.00)
GFR, Estimated: >60 mL/min (ref >60)
Glucose, Bld: 106 mg/dL — ABNORMAL HIGH (ref 70–99)
Potassium: 3.6 mmol/L (ref 3.5–5.1)
Sodium: 138 mmol/L (ref 135–145)

## 2023-03-18 LAB — D-DIMER, QUANTITATIVE: D-Dimer, Quant: <0.27 ug{FEU}/mL (ref 0.00–0.50)

## 2023-03-18 LAB — CBC
HCT: 36.4 % (ref 36.0–46.0)
Hemoglobin: 11.1 g/dL — ABNORMAL LOW (ref 12.0–15.0)
MCH: 23.3 pg — ABNORMAL LOW (ref 26.0–34.0)
MCHC: 30.5 g/dL (ref 30.0–36.0)
MCV: 76.3 fL — ABNORMAL LOW (ref 80.0–100.0)
Platelets: 293 10*3/uL (ref 150–400)
RBC: 4.77 MIL/uL (ref 3.87–5.11)
RDW: 15.2 % (ref 11.5–15.5)
WBC: 6.7 10*3/uL (ref 4.0–10.5)
nRBC: 0 % (ref 0.0–0.2)

## 2023-03-18 LAB — TROPONIN I (HIGH SENSITIVITY)
Troponin I (High Sensitivity): 3 ng/L (ref <18)
Troponin I (High Sensitivity): 3 ng/L (ref <18)

## 2023-03-18 LAB — POC URINE PREG, ED: Preg Test, Ur: NEGATIVE

## 2023-03-18 MED ORDER — ALUM & MAG HYDROXIDE-SIMETH 200-200-20 MG/5ML PO SUSP
30.0000 mL | Freq: Once | ORAL | Status: AC
  Administered 2023-03-18: 30 mL via ORAL
  Filled 2023-03-18: qty 30

## 2023-03-18 MED ORDER — FAMOTIDINE 20 MG PO TABS
40.0000 mg | ORAL_TABLET | Freq: Once | ORAL | Status: AC
  Administered 2023-03-18: 40 mg via ORAL
  Filled 2023-03-18: qty 2

## 2023-03-18 NOTE — ED Provider Notes (Signed)
 [facility] Provider Note    Event Date/Time   First MD Initiated Contact with Patient 03/18/23 1542     (approximate)   History   Chief Complaint: Chest Pain   HPI  Ashley Mcpherson is a 48 y.o. female with a history of GERD, hypertension who comes ED complaining of right-sided chest pain that has been constant for the last 3 days, and hurts to breathe.  Denies cough or fever or shortness of breath.  Reports having had issues with chest pain in the past, has seen cardiology Dr. Bethanie.  Reviewing outside records, she completed a Holter monitor as well as a coronary CTA a few months ago which were both completely normal.          Physical Exam   Triage Vital Signs: ED Triage Vitals  Encounter Vitals Group     BP 03/18/23 1408 (!) 169/103     Systolic BP Percentile --      Diastolic BP Percentile --      Pulse Rate 03/18/23 1408 81     Resp 03/18/23 1408 17     Temp 03/18/23 1408 97.7 F (36.5 C)     Temp Source 03/18/23 1408 Oral     SpO2 03/18/23 1408 100 %     Weight 03/18/23 1402 180 lb (81.6 kg)     Height 03/18/23 1402 5' 4 (1.626 m)     Head Circumference --      Peak Flow --      Pain Score 03/18/23 1402 6     Pain Loc --      Pain Education --      Exclude from Growth Chart --     Most recent vital signs: Vitals:   03/18/23 1408  BP: (!) 169/103  Pulse: 81  Resp: 17  Temp: 97.7 F (36.5 C)  SpO2: 100%    General: Awake, no distress.  CV:  Good peripheral perfusion.  Regular rate rhythm Resp:  Normal effort.  Clear to auscultation bilaterally Abd:  No distention.  Soft nontender Other:  No calf tenderness, no lower extremity edema   ED Results / Procedures / Treatments   Labs (all labs ordered are listed, but only abnormal results are displayed) Labs Reviewed  BASIC METABOLIC PANEL - Abnormal; Notable for the following components:      Result Value   Glucose, Bld 106 (*)    All other components within normal  limits  CBC - Abnormal; Notable for the following components:   Hemoglobin 11.1 (*)    MCV 76.3 (*)    MCH 23.3 (*)    All other components within normal limits  D-DIMER, QUANTITATIVE  POC URINE PREG, ED  TROPONIN I (HIGH SENSITIVITY)  TROPONIN I (HIGH SENSITIVITY)     EKG Interpreted by me Normal sinus rhythm rate of 75.  Normal axis and intervals.  Poor R wave progression.  Normal ST segments and T waves.   RADIOLOGY Chest x-ray interpreted by me, appears normal.  Radiology report reviewed   PROCEDURES:  Procedures   MEDICATIONS ORDERED IN ED: Medications  alum & mag hydroxide-simeth (MAALOX/MYLANTA) 200-200-20 MG/5ML suspension 30 mL (30 mLs Oral Given 03/18/23 1623)  famotidine  (PEPCID ) tablet 40 mg (40 mg Oral Given 03/18/23 1622)     IMPRESSION / MDM / ASSESSMENT AND PLAN / ED COURSE  I reviewed the triage vital signs and the nursing notes.  DDx: Pneumothorax, non-STEMI, GERD, pleural effusion, pulmonary embolism, functional pain,  Patient's presentation is most consistent with acute presentation with potential threat to life or bodily function.  Patient presents with right-sided pleuritic chest pain for the past 3 days.  No exam findings for DVT, but did have colonoscopy about 4 months ago.  Pain is noncardiac.  Doubt dissection or pericardial effusion.  Initial EKG chest x-ray and troponin are all normal.  Will check D-dimer   Clinical Course as of 03/18/23 1709  Sun Mar 18, 2023  1705 D-dimer normal. Pt requests work note for 2 days - will provide. Stable for DC and f/u with her cardiologist. [PS]    Clinical Course User Index [PS] Viviann Pastor, MD     FINAL CLINICAL IMPRESSION(S) / ED DIAGNOSES   Final diagnoses:  Nonspecific chest pain  Post traumatic stress disorder     Rx / DC Orders   ED Discharge Orders          Ordered    Ambulatory referral to Cardiology       Comments: If you have not heard from the Cardiology office within the  next 72 hours please call [phone].   03/18/23 1706             Note:  This document was prepared using Dragon voice recognition software and may include unintentional dictation errors.   Viviann Pastor, MD 03/18/23 [phone]

## 2023-03-18 NOTE — ED Triage Notes (Signed)
 Pt comes with c/o right sided cp that started 3 days ago. Pt states it has now gotten worse. Pt states she was in [place] and it got worse so she came here.   Pt states little sob. Pt denies any hx of  this.

## 2023-03-27 ENCOUNTER — Encounter: Payer: Self-pay | Admitting: *Deleted

## 2023-03-27 ENCOUNTER — Other Ambulatory Visit: Payer: Self-pay

## 2023-03-27 ENCOUNTER — Ambulatory Visit
Admission: EM | Admit: 2023-03-27 | Discharge: 2023-03-27 | Disposition: A | Discharge status: Home / Self Care | Payer: No Typology Code available for payment source | Attending: Emergency Medicine | Admitting: Emergency Medicine

## 2023-03-27 DIAGNOSIS — R300 Dysuria: Secondary | ICD-10-CM | POA: Insufficient documentation

## 2023-03-27 LAB — POCT URINALYSIS DIP (MANUAL ENTRY)
Bilirubin, UA: NEGATIVE
Glucose, UA: NEGATIVE mg/dL — AB
Ketones, POC UA: NEGATIVE mg/dL
Nitrite, UA: NEGATIVE
Protein Ur, POC: 100 mg/dL — AB
Spec Grav, UA: 1.025 (ref 1.010–1.025)
Urobilinogen, UA: 0.2 U/dL
pH, UA: 7.5 (ref 5.0–8.0)

## 2023-03-27 MED ORDER — NITROFURANTOIN MONOHYD MACRO 100 MG PO CAPS
100.0000 mg | ORAL_CAPSULE | Freq: Two times a day (BID) | ORAL | 0 refills | Status: DC
Start: 2023-03-27 — End: 2023-12-11

## 2023-03-27 NOTE — ED Triage Notes (Signed)
 Pt reports dysuria that started SAt,nausea and diarrhea started on Sunday.

## 2023-03-27 NOTE — Discharge Instructions (Signed)
 Your urinalysis shows Ashley Mcpherson blood cells currently does not show any bacteria, your urine will be sent to the lab to determine exactly which bacteria is present, if any changes need to be made to your medications you will be notified  Vaginal swab checking for gonorrhea chlamydia and trichomoniasis bacterial vaginosis and yeast is pending 2 to 3 days, you will be notified of positive test results only and treatment sent in at time of notification, would only have to return to clinic if testing positive for gonorrhea as treatment is an injection  Begin use of macrobid  every morning and every evening for 5 days  You may use over-the-counter Pyridium  commonly known as Uristat, Azo and Cystex to help minimize your symptoms until antibiotic removes bacteria, this medication will turn your urine orange  Increase your fluid intake through use of water  As always practice good hygiene, wiping front to back and avoidance of scented vaginal products to prevent further irritation  If symptoms continue to persist after use of medication or recur please follow-up with urgent care or your primary doctor as needed

## 2023-03-27 NOTE — ED Provider Notes (Signed)
 [facility]    CSN: [id number] Arrival date & time: 03/27/23  1517      History   Chief Complaint Chief Complaint  Patient presents with   Dysuria    HPI SILVER ACHEY is a 48 y.o. female.   Patient presents for evaluation of dysuria, urinary frequency present for 3 days.  Experience diarrhea and nausea without vomiting 2 days ago which has resolved.  Did over-the-counter urinary infection testing which was positive.  Has not attempted treatment.  Denies abdominal pain or back pain worse than baseline, fevers, hematuria or vaginal symptoms.  Requesting full STD testing to rule out additional potential causes.  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   Generalized anxiety disorder 09/19/2022   Insomnia, unspecified 05/23/2022   Nausea without vomiting 05/23/2022   Ventral hernia without obstruction or gangrene 05/17/2022   Diuretic-induced hypokalemia 04/18/2022   Nephrolithiasis 04/18/2022   Panic disorder 04/18/2022   Moderate major depression (HCC) 09/08/2021   Prediabetes 09/08/2021   History of nephrolithiasis 09/07/2020   Hyperlipidemia, mixed 09/07/2020   Insomnia 09/07/2020   Vitamin D  deficiency 09/07/2020   Gastroesophageal reflux disease without esophagitis 09/07/2020   Beta thalassemia trait 09/07/2020   Acute upper respiratory infection 04/02/2020   Shortness of breath 04/02/2020   Encounter for school history and physical examination 03/02/2019   High measles virus antibody titer  03/02/2019   Irritable bowel syndrome with constipation 08/13/2018   Flank pain 06/02/2018   Renal calculi 06/02/2018   Encounter for general adult medical examination with abnormal findings 11/28/2017   Post traumatic stress disorder 11/28/2017   Urinary tract infection with hematuria 08/12/2017   Dysuria 08/12/2017   Chronic bilateral low back pain 08/12/2017   Essential hypertension 04/17/2017   H/O: hysterectomy 03/14/2011    Past Surgical History:  Procedure Laterality Date   COLONOSCOPY WITH PROPOFOL  N/A 11/22/2022   Procedure: COLONOSCOPY WITH PROPOFOL ;  Surgeon: Therisa Bi, MD;  Location: [facility] ENDOSCOPY;  Service: Gastroenterology;  Laterality: N/A;   CRYOABLATION  2010   ESOPHAGOGASTRODUODENOSCOPY (EGD) WITH PROPOFOL  N/A 11/22/2022   Procedure: ESOPHAGOGASTRODUODENOSCOPY (EGD) WITH PROPOFOL ;  Surgeon: Therisa Bi, MD;  Location: [facility] ENDOSCOPY;  Service: Gastroenterology;  Laterality: N/A;   POLYPECTOMY  11/22/2022   Procedure: POLYPECTOMY;  Surgeon: Therisa Bi, MD;  Location: [facility] ENDOSCOPY;  Service: Gastroenterology;;   TUBAL LIGATION  2007   VAGINAL HYSTERECTOMY  2013   Dr. Janit [facility])    OB History     Gravida  3   Para  3   Term  3   Preterm      AB      Living  3      SAB      IAB      Ectopic      Multiple      Live Births               Home Medications    Prior to Admission medications   Medication  Sig Start Date End Date Taking? Authorizing Provider  losartan -hydrochlorothiazide  (HYZAAR) 50-12.5 MG tablet Take 1 tablet by mouth daily. 02/07/23  Yes Simmons-Robinson, Makiera, MD  nitrofurantoin , macrocrystal-monohydrate, (MACROBID ) 100 MG capsule Take 1 capsule (100 mg total) by mouth 2 (two) times daily. 03/27/23  Yes Recie Cirrincione R, NP  traZODone  (DESYREL ) 50 MG tablet TAKE 1/2 TO 1 TABLET(25 TO 50 MG) BY MOUTH AT BEDTIME AS NEEDED FOR SLEEP 03/15/23   Simmons-Robinson, Rockie, MD    Family History Family History  Problem  Relation Age of Onset   Cancer Mother        Pancreatic Cancer   Diabetes Mother    Hypertension Mother    Diabetes Father    Hypertension Father    Hypertension Maternal Grandmother    Diabetes Maternal Grandmother    Hypertension Maternal Grandfather    Hypertension Paternal Grandfather    Bladder Cancer Neg Hx    Kidney cancer Neg Hx     Social History Social History   Tobacco Use   Smoking status: Never    Passive exposure: Never   Smokeless tobacco: Never  Vaping Use   Vaping status: Never Used  Substance Use Topics   Alcohol use: No   Drug use: No     Allergies   Sulfa antibiotics   Review of Systems Review of Systems   Physical Exam Triage Vital Signs ED Triage Vitals  Encounter Vitals Group     BP 03/27/23 1533 (!) 152/96     Systolic BP Percentile --      Diastolic BP Percentile --      Pulse Rate 03/27/23 1533 88     Resp 03/27/23 1533 18     Temp 03/27/23 1533 99 F (37.2 C)     Temp src --      SpO2 03/27/23 1533 99 %     Weight --      Height --      Head Circumference --      Peak Flow --      Pain Score 03/27/23 1531 4     Pain Loc --      Pain Education --      Exclude from Growth Chart --    No data found.  Updated Vital Signs BP (!) 152/96   Pulse 88   Temp 99 F (37.2 C)   Resp 18   SpO2 99%   Visual Acuity Right Eye Distance:   Left Eye Distance:   Bilateral Distance:    Right Eye Near:   Left Eye Near:    Bilateral Near:     Physical Exam Constitutional:      Appearance: Normal appearance.  Eyes:     Extraocular Movements: Extraocular movements intact.  Pulmonary:     Effort: Pulmonary effort is normal.  Abdominal:     General: Abdomen is flat. Bowel sounds are normal. There is no distension.     Palpations: Abdomen is soft.     Tenderness: There is no abdominal tenderness. There is no right CVA tenderness, left CVA tenderness or guarding.  Neurological:     Mental Status: She is alert and oriented to  person, place, and time. Mental status is at baseline.      UC Treatments / Results  Labs (all labs ordered are listed, but only abnormal results are displayed) Labs Reviewed  POCT URINALYSIS DIP (MANUAL ENTRY) - Abnormal; Notable for the following components:      Result Value  Color, UA light yellow (*)    Clarity, UA cloudy (*)    Glucose, UA negative (*)    Blood, UA small (*)    Protein Ur, POC =100 (*)    Leukocytes, UA Small (1+) (*)    All other components within normal limits  URINE CULTURE  CERVICOVAGINAL ANCILLARY ONLY    EKG   Radiology No results found.  Procedures Procedures (including critical care time)  Medications Ordered in UC Medications - No data to display  Initial Impression / Assessment and Plan / UC Course  I have reviewed the triage vital signs and the nursing notes.  Pertinent labs & imaging results that were available during my care of the patient were reviewed by me and considered in my medical decision making (see chart for details).  Dysuria  Urinalysis showing leukocytes, negative for nitrates, sent for culture, discussed findings with patient, prophylactically initiate treatment as she is symptomatic, prescribed Macrobid  and discussed administration, STI labs pending, will treat per protocol, advised abstinence until lab results, discussed additional supportive measures and advised follow-up if symptoms persist worsen or recur Final Clinical Impressions(s) / UC Diagnoses   Final diagnoses:  Dysuria     Discharge Instructions      Your urinalysis shows Itzae Miralles blood cells currently does not show any bacteria, your urine will be sent to the lab to determine exactly which bacteria is present, if any changes need to be made to your medications you will be notified  Vaginal swab checking for gonorrhea chlamydia and trichomoniasis bacterial vaginosis and yeast is pending 2 to 3 days, you will be notified of positive test results only and  treatment sent in at time of notification, would only have to return to clinic if testing positive for gonorrhea as treatment is an injection  Begin use of macrobid  every morning and every evening for 5 days  You may use over-the-counter Pyridium  commonly known as Uristat, Azo and Cystex to help minimize your symptoms until antibiotic removes bacteria, this medication will turn your urine orange  Increase your fluid intake through use of water  As always practice good hygiene, wiping front to back and avoidance of scented vaginal products to prevent further irritation  If symptoms continue to persist after use of medication or recur please follow-up with urgent care or your primary doctor as needed    ED Prescriptions     Medication Sig Dispense Auth. Provider   nitrofurantoin , macrocrystal-monohydrate, (MACROBID ) 100 MG capsule Take 1 capsule (100 mg total) by mouth 2 (two) times daily. 10 capsule Jathen Sudano R, NP      PDMP not reviewed this encounter.   Teresa Shelba SAUNDERS, NP 03/27/23 1551

## 2023-03-29 LAB — CERVICOVAGINAL ANCILLARY ONLY
Bacterial Vaginitis (gardnerella): NEGATIVE
Candida Glabrata: NEGATIVE
Candida Vaginitis: NEGATIVE
Chlamydia: NEGATIVE
Comment: NEGATIVE
Comment: NEGATIVE
Comment: NEGATIVE
Comment: NEGATIVE
Comment: NEGATIVE
Comment: NORMAL
Neisseria Gonorrhea: NEGATIVE
Trichomonas: NEGATIVE

## 2023-03-30 LAB — URINE CULTURE: Culture: >=100000 — AB

## 2023-11-20 ENCOUNTER — Emergency Department

## 2023-11-20 ENCOUNTER — Other Ambulatory Visit: Payer: Self-pay

## 2023-11-20 DIAGNOSIS — Z5321 Procedure and treatment not carried out due to patient leaving prior to being seen by health care provider: Secondary | ICD-10-CM | POA: Diagnosis not present

## 2023-11-20 DIAGNOSIS — R42 Dizziness and giddiness: Secondary | ICD-10-CM | POA: Diagnosis not present

## 2023-11-20 DIAGNOSIS — I10 Essential (primary) hypertension: Secondary | ICD-10-CM | POA: Insufficient documentation

## 2023-11-20 DIAGNOSIS — R0789 Other chest pain: Secondary | ICD-10-CM | POA: Insufficient documentation

## 2023-11-20 LAB — CBC
HCT: 34.5 % — ABNORMAL LOW (ref 36.0–46.0)
Hemoglobin: 10.5 g/dL — ABNORMAL LOW (ref 12.0–15.0)
MCH: 23.4 pg — ABNORMAL LOW (ref 26.0–34.0)
MCHC: 30.4 g/dL (ref 30.0–36.0)
MCV: 76.8 fL — ABNORMAL LOW (ref 80.0–100.0)
Platelets: 278 K/uL (ref 150–400)
RBC: 4.49 MIL/uL (ref 3.87–5.11)
RDW: 15.2 % (ref 11.5–15.5)
WBC: 7.2 K/uL (ref 4.0–10.5)
nRBC: 0 % (ref 0.0–0.2)

## 2023-11-20 LAB — BASIC METABOLIC PANEL WITH GFR
Anion gap: 11 (ref 5–15)
BUN: 16 mg/dL (ref 6–20)
CO2: 27 mmol/L (ref 22–32)
Calcium: 8.6 mg/dL — ABNORMAL LOW (ref 8.9–10.3)
Chloride: 104 mmol/L (ref 98–111)
Creatinine, Ser: 0.93 mg/dL (ref 0.44–1.00)
GFR, Estimated: >60 mL/min (ref >60)
Glucose, Bld: 92 mg/dL (ref 70–99)
Potassium: 3.6 mmol/L (ref 3.5–5.1)
Sodium: 142 mmol/L (ref 135–145)

## 2023-11-20 LAB — TROPONIN I (HIGH SENSITIVITY): Troponin I (High Sensitivity): 2 ng/L (ref <18)

## 2023-11-20 NOTE — ED Triage Notes (Signed)
 Pt reports right side chest pain and dizziness over past few days, pt reports hx of HTN has been taking prescribed meds correctly.

## 2023-11-21 ENCOUNTER — Emergency Department
Admission: EM | Admit: 2023-11-21 | Discharge: 2023-11-21 | Discharge status: Against Medical Advice | Attending: Emergency Medicine | Admitting: Emergency Medicine

## 2023-11-21 ENCOUNTER — Emergency Department

## 2023-12-10 ENCOUNTER — Encounter: Payer: Self-pay | Admitting: Family Medicine

## 2023-12-11 ENCOUNTER — Encounter: Payer: Self-pay | Admitting: Family Medicine

## 2023-12-11 ENCOUNTER — Ambulatory Visit: Admitting: Family Medicine

## 2023-12-11 VITALS — BP 130/92 | HR 67 | Temp 98.1°F | Ht 64.0 in | Wt 176.3 lb

## 2023-12-11 DIAGNOSIS — M7541 Impingement syndrome of right shoulder: Secondary | ICD-10-CM | POA: Insufficient documentation

## 2023-12-11 DIAGNOSIS — Z1231 Encounter for screening mammogram for malignant neoplasm of breast: Secondary | ICD-10-CM | POA: Diagnosis not present

## 2023-12-11 DIAGNOSIS — F5104 Psychophysiologic insomnia: Secondary | ICD-10-CM

## 2023-12-11 MED ORDER — HYDROXYZINE HCL 25 MG PO TABS: 25.0000 mg | ORAL_TABLET | Freq: Three times a day (TID) | ORAL | 2 refills | Status: AC | PRN

## 2023-12-11 MED ORDER — MELOXICAM 15 MG PO TABS
15.0000 mg | ORAL_TABLET | Freq: Every day | ORAL | 0 refills | Status: AC
Start: 2023-12-11 — End: ?

## 2023-12-11 MED ORDER — METHOCARBAMOL 500 MG PO TABS: 500.0000 mg | ORAL_TABLET | Freq: Four times a day (QID) | ORAL | 2 refills | Status: AC

## 2023-12-11 NOTE — Progress Notes (Unsigned)
 Established patient visit   Patient: Ashley Mcpherson   DOB: 04/02/1975   48 y.o. Female  MRN: [id number] Visit Date: 12/11/2023  Today's healthcare provider: Rockie Agent, MD   Chief Complaint  Patient presents with   Medical Management of Chronic Issues    Patient is present for mammogram referral and general check in    Shoulder Pain    Patient is experiencing some back/ shoulder pain and tightness x 3 weeks. 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    Subjective     HPI     Medical Management of Chronic Issues    Additional comments: Patient is present for mammogram referral and general check in         Shoulder Pain    Additional comments: Patient is experiencing some back/ shoulder pain and tightness x 3 weeks. 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       Last edited by Cherry Chiquita HERO, CMA on 12/11/2023  3:28 PM.       Discussed the use of AI scribe software for clinical note transcription with the patient, who gave verbal consent to proceed.  History of Present Illness Ashley Mcpherson is a 48 year old female who presents for breast cancer screening and evaluation of shoulder and back pain.  She is here for a routine mammogram as part of breast cancer screening.  She has been experiencing shoulder and back pain for the past three weeks, primarily located in the right shoulder and extending to the shoulder blade area. The pain is exacerbated by work activities, specifically repetitive pushing motions on a microtome. The pain is less pronounced when she is at home and not performing these activities. No numbness or tingling radiating down the arm, but there is some weakness when lifting or pulling objects. She is  right-handed and has not had similar issues in the past. The workload increase at her job has coincided with the onset of her symptoms.  She has been using heat therapy at night and has tried Motrin  and nighttime Tylenol  for pain relief, but these have not significantly alleviated her symptoms. She has not been taking any other medications for this issue.  Approximately three weeks ago, she visited the emergency room due to concerns of chest pain, which she later attributed to the shoulder and back pain. She experiences tingling sensations in the shoulder area during certain movements.  Her current medications include losartan , which she continues to take as prescribed.   Pt reports that she was using hydroxyzine to help with sleep and requests a refill   Past Medical History:  Diagnosis Date   GERD (gastroesophageal reflux disease)    Hypertension    IBS (irritable bowel syndrome)    Kidney stone     Medications: Outpatient Medications Prior to Visit  Medication Sig   losartan -hydrochlorothiazide  (HYZAAR) 50-12.5 MG tablet Take 1 tablet by mouth daily.   [DISCONTINUED] nitrofurantoin , macrocrystal-monohydrate, (MACROBID ) 100 MG capsule Take 1 capsule (100 mg total) by mouth 2 (two) times daily.   [DISCONTINUED] traZODone  (DESYREL ) 50 MG tablet TAKE 1/2 TO 1 TABLET(25 TO 50 MG) BY MOUTH AT BEDTIME AS NEEDED FOR SLEEP   No facility-administered medications prior to visit.  Review of Systems  Last CBC Lab Results  Component Value Date   WBC 7.2 11/20/2023   HGB 10.5 (L) 11/20/2023   HCT 34.5 (L) 11/20/2023   MCV 76.8 (L) 11/20/2023   MCH 23.4 (L) 11/20/2023   RDW 15.2 11/20/2023   PLT 278 11/20/2023   Last metabolic panel Lab Results  Component Value Date   GLUCOSE 92 11/20/2023   NA 142 11/20/2023   K 3.6 11/20/2023   CL 104 11/20/2023   CO2 27 11/20/2023   BUN 16 11/20/2023   CREATININE 0.93 11/20/2023   GFRNONAA >60 11/20/2023   CALCIUM 8.6 (L) 11/20/2023    PROT 7.8 10/27/2022   ALBUMIN 3.8 10/27/2022   LABGLOB 3.2 09/07/2022   AGRATIO 1.4 05/10/2022   BILITOT 0.6 10/27/2022   ALKPHOS 62 10/27/2022   AST 21 10/27/2022   ALT 21 10/27/2022   ANIONGAP 11 11/20/2023   Last lipids Lab Results  Component Value Date   CHOL 232 (H) 09/07/2022   HDL 58 09/07/2022   LDLCALC 150 (H) 09/07/2022   TRIG 134 09/07/2022   CHOLHDL 4.0 09/07/2022   Last hemoglobin A1c Lab Results  Component Value Date   HGBA1C 5.5 09/07/2022   Last thyroid  functions Lab Results  Component Value Date   TSH 0.697 09/07/2022   Last vitamin D  Lab Results  Component Value Date   VD25OH 14.4 (L) 02/28/2019   Last vitamin B12 and Folate Lab Results  Component Value Date   VITAMINB12 315 10/09/2022   FOLATE 7.2 10/09/2022        Objective    BP (!) 130/92 Comment: Manual  Pulse 67   Temp 98.1 F (36.7 C) (Oral)   Ht 5' 4 (1.626 m)   Wt 176 lb 4.8 oz (80 kg)   SpO2 100%   BMI 30.26 kg/m  BP Readings from Last 3 Encounters:  12/11/23 (!) 130/92  11/20/23 (!) 180/110  03/27/23 (!) 152/96   Wt Readings from Last 3 Encounters:  12/11/23 176 lb 4.8 oz (80 kg)  11/20/23 179 lb (81.2 kg)  03/18/23 180 lb (81.6 kg)        Physical Exam  Physical Exam MUSCULOSKELETAL: Right shoulder with positive Hawkins test and muscle spasms palpated on right trapezius muscle as well as in rhomboid region on the right. Negative empty can test on right, liftoff test is negative     No results found for any visits on 12/11/23.  Assessment & Plan     Problem List Items Addressed This Visit     Impingement syndrome of shoulder region, right - Primary   Relevant Medications   methocarbamol (ROBAXIN) 500 MG tablet   meloxicam  (MOBIC ) 15 MG tablet   Insomnia   Relevant Medications   hydrOXYzine (ATARAX) 25 MG tablet   Other Visit Diagnoses       Screening mammogram for breast cancer           Assessment and Plan Assessment & Plan Right shoulder  pain with possible impingement and muscle spasm Right shoulder pain for three weeks, exacerbated by repetitive motion at work. Pain is primarily in the shoulder joint and trapezius area, with associated muscle spasms and possible rhomboid strain. Positive Hawkins test suggests possible impingement. No radiation, numbness, or significant weakness reported. Pain not significantly relieved by over-the-counter medications. Differential includes AC joint irritation or arthritis. - Prescribe meloxicam  15 mg once daily. Advise against taking additional NSAIDs. - Prescribe Robaxin 500 mg, to be taken up to four  times daily as needed for muscle relaxation. - Provide education on shoulder exercises to alleviate symptoms. - Advise use of heat therapy as needed for muscle spasms. - Schedule follow-up in one month to assess improvement. If no improvement, consider referral to orthopedics. - Instruct to send a MyChart message if symptoms persist or worsen before the follow-up.  Breast cancer screening Breast cancer screening is due. - Order mammogram for breast cancer screening.  Insomnia  Chronic  Well controlled on hydroxyzine 25mg  at bedtime and used as needed for anxiety  Pt reports that she threw away her old supply during a move on accident  Refills provided today  Patient was given work note to return to work on 12/12/23  Return in about 6 weeks (around 01/22/2024) for right shoulder impingment .         Rockie Agent, MD  [facility] [phone] (phone) [phone] (fax)  [facility]

## 2023-12-11 NOTE — Patient Instructions (Signed)
 To keep you healthy, please keep in mind the following health maintenance items that you are due for:   Health Maintenance Due  Topic Date Due   Hepatitis B Vaccines 19-59 Average Risk (1 of 3 - 19+ 3-dose series) Never done   Mammogram  10/17/2023     Best Wishes,   Dr. Lang

## 2023-12-27 ENCOUNTER — Other Ambulatory Visit: Payer: Self-pay | Admitting: Family Medicine

## 2023-12-27 DIAGNOSIS — Z1231 Encounter for screening mammogram for malignant neoplasm of breast: Secondary | ICD-10-CM

## 2024-01-04 ENCOUNTER — Telehealth: Payer: Self-pay | Admitting: Family Medicine

## 2024-01-04 ENCOUNTER — Other Ambulatory Visit: Payer: Self-pay | Admitting: Family Medicine

## 2024-01-04 DIAGNOSIS — Z9350 Unspecified cystostomy status: Secondary | ICD-10-CM

## 2024-01-04 NOTE — Telephone Encounter (Signed)
 Referral submitted

## 2024-01-29 ENCOUNTER — Ambulatory Visit: Admitting: Family Medicine

## 2024-01-30 ENCOUNTER — Ambulatory Visit
Admission: RE | Admit: 2024-01-30 | Discharge: 2024-01-30 | Disposition: A | Discharge status: Home / Self Care | Source: Ambulatory Visit | Attending: Family Medicine | Admitting: Family Medicine

## 2024-01-30 DIAGNOSIS — Z1231 Encounter for screening mammogram for malignant neoplasm of breast: Secondary | ICD-10-CM | POA: Diagnosis present

## 2024-02-04 ENCOUNTER — Ambulatory Visit: Payer: Self-pay | Admitting: Family Medicine

## 2024-02-15 ENCOUNTER — Other Ambulatory Visit: Payer: Self-pay | Admitting: Family Medicine

## 2024-02-15 DIAGNOSIS — I10 Essential (primary) hypertension: Secondary | ICD-10-CM

## 2024-03-27 ENCOUNTER — Encounter: Payer: Self-pay | Admitting: Emergency Medicine

## 2024-03-27 ENCOUNTER — Ambulatory Visit: Payer: Self-pay

## 2024-03-27 ENCOUNTER — Ambulatory Visit
Admission: EM | Admit: 2024-03-27 | Discharge: 2024-03-27 | Disposition: A | Discharge status: Home / Self Care | Attending: Emergency Medicine | Admitting: Emergency Medicine

## 2024-03-27 DIAGNOSIS — R300 Dysuria: Secondary | ICD-10-CM | POA: Insufficient documentation

## 2024-03-27 LAB — POCT URINE DIPSTICK
Bilirubin, UA: NEGATIVE
Glucose, UA: NEGATIVE mg/dL
Ketones, POC UA: NEGATIVE mg/dL
Nitrite, UA: NEGATIVE
POC PROTEIN,UA: NEGATIVE
Spec Grav, UA: 1.015
Urobilinogen, UA: 0.2 U/dL
pH, UA: 6

## 2024-03-27 MED ORDER — CEPHALEXIN 500 MG PO CAPS: 500.0000 mg | ORAL_CAPSULE | Freq: Three times a day (TID) | ORAL | 0 refills | Status: AC

## 2024-03-27 NOTE — Telephone Encounter (Signed)
 Reviewed telephone report   Agree with urgent evaluation as recommended

## 2024-03-27 NOTE — Discharge Instructions (Addendum)
 Take the antibiotic as directed.  The urine culture is pending.  We will call you if it shows the need to change or discontinue your antibiotic.    Follow up with your primary care provider if your symptoms are not improving.

## 2024-03-27 NOTE — ED Triage Notes (Signed)
 Patient in office today complaint of urine frequency, discomfort , chills and fatigue x3d Brought a UTI test and strip say possible uti  OTC:tylenol  PM  Denies:fever, vomiting

## 2024-03-27 NOTE — Telephone Encounter (Signed)
 Pt advised to go to UC as there is no appointment availability today. She is reporting lack of sleep due to urinary frequency and dysuria, chills, nausea.   FYI Only or Action Required?: Action required by provider: request for appointment.  Patient was last seen in primary care on 12/11/2023 by Sharma Coyer, MD.  Called Nurse Triage reporting Urinary Tract Infection.  Symptoms began a week ago.  Interventions attempted: OTC medications: tylenol .  Symptoms are: gradually worsening.  Triage Disposition: See Physician Within 24 Hours  Patient/caregiver understands and will follow disposition?: Yes  Copied from CRM #[id number]. Topic: Clinical - Red Word Triage >> Mar 27, 2024  8:45 AM Logan F wrote: Red Word that prompted transfer to Nurse Triage: Possible UTI. Having chills, sweats, discomfort when urinating, burning with urinating, and frequency. Has been going on for 2-3 days. She says she took a OTC UTI test and it came back positive. She also mentions cloudy/bloody urine sometimes. Symptoms are worsening. Reason for Disposition  More than 2 UTI's in last year  Answer Assessment - Initial Assessment Questions 1. SEVERITY: How bad is the pain?  (e.g., Scale 1-10; mild, moderate, or severe)     It's pretty uncomfortable   2. FREQUENCY: How many times have you had painful urination today?      I got up once, it wasn't too bad today but last night it was kind of the worst  4. ONSET: When did the painful urination start?      Approx one week  5. FEVER: Do you have a fever? If Yes, ask: What is your temperature, how was it measured, and when did it start?     I haven't really checked, all I know is I'm cold one minute and I'm sweating the next. Took tylenol  last night  6. PAST UTI: Have you had a urine infection before? If Yes, ask: When was the last time? and What happened that time?      All the time   7. CAUSE: What do you think is causing the  painful urination?  (e.g., UTI, scratch, Herpes sore)     UTI, reports she took an at-home test and it was positive for UTI  8. OTHER SYMPTOMS: Do you have any other symptoms? (e.g., blood in urine, flank pain, genital sores, urgency, vaginal discharge) Fatigue, chills  Protocols used: Urination Pain - Female-A-AH

## 2024-03-27 NOTE — ED Provider Notes (Signed)
 " CAY RALPH PELT    CSN: [id number] Arrival date & time: 03/27/24  1230      History   Chief Complaint Chief Complaint  Patient presents with   Nausea   Urinary Frequency    HPI Ashley Mcpherson is a 49 y.o. female.  Patient presents with 2-day history of dysuria; 3-day history of fatigue and chills.  She took Tylenol  PM 2 days ago; no OTC medications taken today.  No fever, abdominal pain, hematuria, flank pain.  Patient states she took an at home UTI test which was positive.  The history is provided by the patient and medical records.    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   Generalized anxiety disorder 09/19/2022   Insomnia, unspecified 05/23/2022   Nausea without vomiting 05/23/2022   Ventral hernia without obstruction or gangrene 05/17/2022   Diuretic-induced hypokalemia 04/18/2022   Nephrolithiasis 04/18/2022   Panic disorder 04/18/2022   Moderate major depression (HCC) 09/08/2021   Prediabetes 09/08/2021   History of nephrolithiasis 09/07/2020   Hyperlipidemia, mixed 09/07/2020   Insomnia 09/07/2020   Vitamin D  deficiency 09/07/2020   Gastroesophageal reflux disease without esophagitis 09/07/2020   Beta thalassemia trait 09/07/2020   Acute upper respiratory infection 04/02/2020   Shortness of breath 04/02/2020   Encounter for school history and physical examination 03/02/2019   High measles virus antibody titer 03/02/2019   Irritable  bowel syndrome with constipation 08/13/2018   Flank pain 06/02/2018   Renal calculi 06/02/2018   Encounter for general adult medical examination with abnormal findings 11/28/2017   Post traumatic stress disorder 11/28/2017   Urinary tract infection with hematuria 08/12/2017   Dysuria 08/12/2017   Chronic bilateral low back pain 08/12/2017   Essential hypertension 04/17/2017   H/O: hysterectomy 03/14/2011    Past Surgical History:  Procedure Laterality Date   COLONOSCOPY WITH PROPOFOL  N/A 11/22/2022   Procedure: COLONOSCOPY WITH PROPOFOL ;  Surgeon: Therisa Bi, MD;  Location: [facility] ENDOSCOPY;  Service: Gastroenterology;  Laterality: N/A;   CRYOABLATION  2010   ESOPHAGOGASTRODUODENOSCOPY (EGD) WITH PROPOFOL  N/A 11/22/2022   Procedure: ESOPHAGOGASTRODUODENOSCOPY (EGD) WITH PROPOFOL ;  Surgeon: Therisa Bi, MD;  Location: [facility] ENDOSCOPY;  Service: Gastroenterology;  Laterality: N/A;   POLYPECTOMY  11/22/2022   Procedure: POLYPECTOMY;  Surgeon: Therisa Bi, MD;  Location: [facility] ENDOSCOPY;  Service: Gastroenterology;;   TUBAL LIGATION  2007   VAGINAL HYSTERECTOMY  2013   [facility])    OB History     Gravida  3   Para  3   Term  3   Preterm      AB      Living  3      SAB      IAB      Ectopic      Multiple      Live Births               Home Medications  Prior to Admission medications  Medication Sig Start Date End Date Taking? Authorizing Provider  cephALEXin  (KEFLEX ) 500 MG capsule Take 1 capsule (500 mg total) by mouth 3 (three) times daily for 5 days. 03/27/24 04/01/24 Yes Corlis Burnard DEL, NP  hydrOXYzine  (ATARAX ) 25 MG tablet Take 1 tablet (25 mg total) by mouth 3 (three) times daily as needed. 12/11/23   Simmons-Robinson, Makiera, MD  losartan -hydrochlorothiazide  (HYZAAR) 50-12.5 MG tablet TAKE 1 TABLET BY MOUTH DAILY 02/15/24   Simmons-Robinson, Rockie, MD  meloxicam  (MOBIC ) 15 MG tablet Take 1 tablet (15 mg total) by mouth daily. 12/11/23    Simmons-Robinson, Makiera, MD  methocarbamol  (ROBAXIN ) 500 MG tablet Take 1 tablet (500 mg total) by mouth 4 (four) times daily. 12/11/23   Simmons-Robinson, Rockie, MD    Family History Family History  Problem Relation Age of Onset   Cancer Mother        Pancreatic Cancer   Diabetes Mother    Hypertension Mother    Diabetes Father    Hypertension Father    Hypertension Maternal Grandmother    Diabetes Maternal Grandmother    Hypertension Maternal Grandfather    Hypertension Paternal Grandfather    Bladder Cancer Neg Hx    Kidney cancer Neg Hx     Social History Social History[1]   Allergies   Sulfa antibiotics   Review of Systems Review of Systems  Constitutional:  Negative for chills and fever.  Gastrointestinal:  Negative for abdominal pain.  Genitourinary:  Positive for dysuria. Negative for flank pain and hematuria.     Physical Exam Triage Vital Signs ED Triage Vitals  Encounter Vitals Group     BP 03/27/24 1248 131/85     Girls Systolic BP Percentile --      Girls Diastolic BP Percentile --      Boys Systolic BP Percentile --      Boys Diastolic BP Percentile --      Pulse Rate 03/27/24 1248 78     Resp 03/27/24 1248 18     Temp 03/27/24 1248 97.8 F (36.6 C)     Temp src --      SpO2 03/27/24 1248 99 %     Weight 03/27/24 1301 179 lb (81.2 kg)     Height 03/27/24 1301 5' 4 (1.626 m)     Head Circumference --      Peak Flow --      Pain Score --      Pain Loc --      Pain Education --      Exclude from Growth Chart --    No data found.  Updated Vital Signs BP 131/85   Pulse 78   Temp 97.8 F (36.6 C)   Resp 18   Ht 5' 4 (1.626 m)   Wt 179 lb (81.2 kg)   SpO2 99%   BMI 30.73 kg/m   Visual Acuity Right Eye Distance:   Left Eye Distance:   Bilateral Distance:    Right Eye Near:   Left Eye Near:    Bilateral Near:     Physical Exam Constitutional:      General: She is not in acute distress. HENT:     Mouth/Throat:      Mouth: Mucous membranes are moist.  Cardiovascular:     Rate and Rhythm: Normal rate.  Pulmonary:     Effort: Pulmonary effort is normal. No respiratory distress.  Abdominal:     General: Bowel sounds are normal.  Palpations: Abdomen is soft.     Tenderness: There is no abdominal tenderness. There is no right CVA tenderness, left CVA tenderness, guarding or rebound.  Neurological:     Mental Status: She is alert.      UC Treatments / Results  Labs (all labs ordered are listed, but only abnormal results are displayed) Labs Reviewed  POCT URINE DIPSTICK - Abnormal; Notable for the following components:      Result Value   Blood, UA small (*)    Leukocytes, UA Trace (*)    All other components within normal limits  URINE CULTURE    EKG   Radiology No results found.  Procedures Procedures (including critical care time)  Medications Ordered in UC Medications - No data to display  Initial Impression / Assessment and Plan / UC Course  I have reviewed the triage vital signs and the nursing notes.  Pertinent labs & imaging results that were available during my care of the patient were reviewed by me and considered in my medical decision making (see chart for details).   Dysuria.  Afebrile and vital signs are stable.  Treating with Keflex . Urine culture pending. Discussed with patient that we will call her if the urine culture shows the need to change or discontinue the antibiotic. Instructed her to follow-up with her PCP if her symptoms are not improving. Patient agrees to plan of care.    Final Clinical Impressions(s) / UC Diagnoses   Final diagnoses:  Dysuria     Discharge Instructions      Take the antibiotic as directed.  The urine culture is pending.  We will call you if it shows the need to change or discontinue your antibiotic.    Follow-up with your primary care provider if your symptoms are not improving.      ED Prescriptions     Medication Sig  Dispense Auth. Provider   cephALEXin  (KEFLEX ) 500 MG capsule Take 1 capsule (500 mg total) by mouth 3 (three) times daily for 5 days. 15 capsule Corlis Burnard DEL, NP      PDMP not reviewed this encounter.    [1]  Social History Tobacco Use   Smoking status: Never    Passive exposure: Never   Smokeless tobacco: Never  Vaping Use   Vaping status: Never Used  Substance Use Topics   Alcohol use: No   Drug use: No     Corlis Burnard DEL, NP 03/27/24 1322  "

## 2024-03-29 LAB — URINE CULTURE

## 2024-03-31 ENCOUNTER — Ambulatory Visit (HOSPITAL_COMMUNITY): Payer: Self-pay

## 2024-04-03 ENCOUNTER — Encounter: Payer: Self-pay | Admitting: Family Medicine

## 2024-04-03 ENCOUNTER — Ambulatory Visit (INDEPENDENT_AMBULATORY_CARE_PROVIDER_SITE_OTHER): Admitting: Family Medicine

## 2024-04-03 VITALS — BP 140/70 | HR 74 | Ht 64.0 in

## 2024-04-03 DIAGNOSIS — I10 Essential (primary) hypertension: Secondary | ICD-10-CM | POA: Diagnosis not present

## 2024-04-03 DIAGNOSIS — M79609 Pain in unspecified limb: Secondary | ICD-10-CM | POA: Diagnosis not present

## 2024-04-03 DIAGNOSIS — G4726 Circadian rhythm sleep disorder, shift work type: Secondary | ICD-10-CM

## 2024-04-03 DIAGNOSIS — M7541 Impingement syndrome of right shoulder: Secondary | ICD-10-CM | POA: Diagnosis not present

## 2024-04-03 NOTE — Progress Notes (Signed)
 "  Established Patient Office Visit  Patient ID: Ashley Mcpherson, female    DOB: December 14, 1975  Age: 49 y.o. MRN: [id number] PCP: Sharma Coyer, MD  Chief Complaint  Patient presents with   Follow-up    Patient is present for right shoulder impigment follow-up. She was last seen in office on 12/11/23. She reports it has gotten better since her last visit and tolerating treatment well.     Subjective:     HPI  Discussed the use of AI scribe software for clinical note transcription with the patient, who gave verbal consent to proceed.  History of Present Illness Ashley Mcpherson is a 49 year old female with right shoulder impingement who presents for follow-up.  She reports improvement in her right shoulder pain and is tolerating the treatment well. She was previously prescribed meloxicam  15 mg daily and Robaxin  500 mg four times daily, along with heat therapy, but has not used the medication recently as her symptoms have improved.  She reports a new issue of cramping and tightness behind her left knee for over a month. The cramp started suddenly while sitting and has persisted, causing her leg to feel heavy and tight. She denies any prolonged sitting or long trips but mentions her work setup involves standing with feet on stools. She notes some visible veins on the left leg, which are slightly painful, but no tenderness to touch. No redness or edema in the leg.  She has a history of recurrent urinary tract infections, which she attributes to kidney stones. She is currently being treated for a UTI with Keflex  500 mg three times a day, which she started the previous day after feeling tired and nauseous.  She has a history of hypertension and is on losartan  50 mg and hydrochlorothiazide  12.5 mg. She has not been taking her blood pressure medication recently, which may have contributed to her elevated blood pressure reading of 140/89 during the visit.  Her family history is significant  for her mother having had a blood clot, possibly related to pancreatic cancer and chemotherapy. She works at [employer] and is responsible for taking care of seven children, including three of her own and four grandchildren.   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   Generalized anxiety disorder 09/19/2022   Insomnia, unspecified 05/23/2022   Nausea without vomiting 05/23/2022   Ventral hernia without obstruction or gangrene 05/17/2022   Diuretic-induced hypokalemia 04/18/2022   Nephrolithiasis 04/18/2022   Panic disorder 04/18/2022   Moderate major depression (HCC) 09/08/2021   Prediabetes 09/08/2021   History of nephrolithiasis 09/07/2020   Hyperlipidemia, mixed 09/07/2020   Insomnia 09/07/2020   Vitamin D  deficiency 09/07/2020   Gastroesophageal reflux disease without esophagitis 09/07/2020   Beta thalassemia trait 09/07/2020   Acute upper respiratory infection 04/02/2020   Shortness of breath 04/02/2020   Encounter for school history and physical examination 03/02/2019   High measles virus antibody titer 03/02/2019   Irritable bowel syndrome with constipation 08/13/2018   Flank pain 06/02/2018   Renal calculi 06/02/2018   Encounter for general adult medical examination with abnormal findings 11/28/2017   Post traumatic  stress disorder 11/28/2017   Urinary tract infection with hematuria 08/12/2017   Dysuria 08/12/2017   Chronic bilateral low back pain 08/12/2017   Essential hypertension 04/17/2017   H/O: hysterectomy 03/14/2011   Past Medical History:  Diagnosis Date   GERD (gastroesophageal reflux disease)     Hypertension    IBS (irritable bowel syndrome)    Kidney stone       ROS    Objective:     BP (!) 140/70 (Cuff Size: Normal)   Pulse 74   Ht 5' 4 (1.626 m)   SpO2 100%   BMI 30.73 kg/m  BP Readings from Last 3 Encounters:  04/03/24 (!) 140/70  03/27/24 131/85  12/11/23 (!) 130/92   Wt Readings from Last 3 Encounters:  03/27/24 179 lb (81.2 kg)  12/11/23 176 lb 4.8 oz (80 kg)  11/20/23 179 lb (81.2 kg)      Physical Exam Vitals reviewed.  Constitutional:      General: She is not in acute distress.    Appearance: Normal appearance. She is not ill-appearing.  Cardiovascular:     Rate and Rhythm: Normal rate and regular rhythm.  Pulmonary:     Effort: Pulmonary effort is normal. No respiratory distress.     Breath sounds: No wheezing, rhonchi or rales.  Neurological:     Mental Status: She is alert and oriented to person, place, and time.  Psychiatric:        Mood and Affect: Mood normal.        Behavior: Behavior normal.     Physical Exam    No results found for any visits on 04/03/24.  Last CBC Lab Results  Component Value Date   WBC 7.2 11/20/2023   HGB 10.5 (L) 11/20/2023   HCT 34.5 (L) 11/20/2023   MCV 76.8 (L) 11/20/2023   MCH 23.4 (L) 11/20/2023   RDW 15.2 11/20/2023   PLT 278 11/20/2023   Last metabolic panel Lab Results  Component Value Date   GLUCOSE 92 11/20/2023   NA 142 11/20/2023   K 3.6 11/20/2023   CL 104 11/20/2023   CO2 27 11/20/2023   BUN 16 11/20/2023   CREATININE 0.93 11/20/2023   GFRNONAA >60 11/20/2023   CALCIUM 8.6 (L) 11/20/2023   PROT 7.8 10/27/2022   ALBUMIN 3.8 10/27/2022   LABGLOB 3.2 09/07/2022   AGRATIO 1.4 05/10/2022   BILITOT 0.6 10/27/2022   ALKPHOS 62 10/27/2022   AST 21 10/27/2022   ALT 21 10/27/2022   ANIONGAP 11 11/20/2023   Last lipids Lab Results  Component Value Date   CHOL 232 (H) 09/07/2022   HDL 58 09/07/2022   LDLCALC 150 (H) 09/07/2022   TRIG 134 09/07/2022   CHOLHDL 4.0  09/07/2022   Last hemoglobin A1c Lab Results  Component Value Date   HGBA1C 5.5 09/07/2022   Last thyroid  functions Lab Results  Component Value Date   TSH 0.697 09/07/2022   FREET4 1.17 02/28/2019   Last vitamin D  Lab Results  Component Value Date   VD25OH 14.4 (L) 02/28/2019   Last vitamin B12 and Folate Lab Results  Component Value Date   VITAMINB12 315 10/09/2022   FOLATE 7.2 10/09/2022      The 10-year ASCVD risk score (Arnett DK, et al., 2019) is: 4.9%  Outpatient Encounter Medications as of 04/03/2024  Medication Sig   hydrOXYzine  (ATARAX ) 25 MG tablet Take 1 tablet (25 mg total) by mouth 3 (three) times daily as needed.   losartan -hydrochlorothiazide  (HYZAAR) 50-12.5  MG tablet TAKE 1 TABLET BY MOUTH DAILY   meloxicam  (MOBIC ) 15 MG tablet Take 1 tablet (15 mg total) by mouth daily.   methocarbamol  (ROBAXIN ) 500 MG tablet Take 1 tablet (500 mg total) by mouth 4 (four) times daily.   No facility-administered encounter medications on file as of 04/03/2024.       Assessment & Plan:   Problem List Items Addressed This Visit     Essential hypertension   Impingement syndrome of shoulder region, right   Shift work sleep disorder   Other Visit Diagnoses       Popliteal pain    -  Primary   Relevant Orders   US  Venous Img Lower Unilateral Left (DVT)       Assessment and Plan Assessment & Plan Right shoulder impingement syndrome Improving  Improvement in symptoms with current treatment regimen. No longer using medications as symptoms have improved. - Continue current management as symptoms have improved.  Left popliteal fossa pain, rule out Baker's cyst or deep vein thrombosis Subacute  Intermittent cramp-like pain behind the left knee for over a month, with heaviness and tightness. Differential diagnosis includes Baker's cyst and deep vein thrombosis (DVT). No recent prolonged immobility or long travel history. Family history of blood clots, but likely  related to maternal cancer treatment rather than hereditary risk. - Ordered ultrasound of the left popliteal fossa to evaluate for Baker's cyst or DVT. - If ultrasound indicates a cyst, will refer to orthopedics for potential aspiration.  Essential hypertension Chronic  Blood pressure elevated at 140/89, likely due to missed medication dose. Currently on losartan  50 mg and hydrochlorothiazide  12.5 mg. Blood pressure improved to 140/70 upon recheck. - Encouraged regular blood pressure monitoring at home. - Continue current antihypertensive regimen.  Recurrent urinary tract infection, history of nephrolithiasis Chronic  Currently being treated for UTI with Keflex  500 mg three times a day. Symptoms include fatigue and nausea. UTI likely related to nephrolithiasis. - Continue Keflex  500 mg three times a day for UTI treatment.    Return in about 6 months (around 10/01/2024) for CPE.    Rockie Agent, MD [facility]  "

## 2024-04-03 NOTE — Patient Instructions (Signed)
 To keep you healthy, please keep in mind the following health maintenance items that you are due for:   Health Maintenance Due  Topic Date Due   Hepatitis B Vaccines 19-59 Average Risk (1 of 3 - 19+ 3-dose series) Never done   COVID-19 Vaccine (3 - 2025-26 season) 11/12/2023     Best Wishes,   Dr. Lang

## 2024-04-09 ENCOUNTER — Ambulatory Visit
Admission: RE | Admit: 2024-04-09 | Discharge: 2024-04-09 | Disposition: A | Discharge status: Home / Self Care | Source: Ambulatory Visit | Attending: Family Medicine | Admitting: Family Medicine

## 2024-04-09 ENCOUNTER — Ambulatory Visit: Payer: Self-pay | Admitting: Family Medicine

## 2024-04-09 DIAGNOSIS — M79609 Pain in unspecified limb: Secondary | ICD-10-CM | POA: Insufficient documentation

## 2024-04-09 DIAGNOSIS — M7122 Synovial cyst of popliteal space [Baker], left knee: Secondary | ICD-10-CM

## 2024-04-10 ENCOUNTER — Ambulatory Visit

## 2024-04-10 VITALS — BP 134/87 | HR 76 | Ht 64.0 in | Wt 178.2 lb

## 2024-04-10 DIAGNOSIS — M25562 Pain in left knee: Secondary | ICD-10-CM | POA: Diagnosis not present

## 2024-04-10 DIAGNOSIS — M1712 Unilateral primary osteoarthritis, left knee: Secondary | ICD-10-CM

## 2024-04-10 NOTE — Addendum Note (Signed)
 Addended by: VANDERBILT PLOWMAN L on: 04/10/2024 11:11 AM   Modules accepted: Orders

## 2024-04-10 NOTE — Progress Notes (Signed)
 "  Office Visit Note   Patient: Ashley Mcpherson           Date of Birth: 08/30/1975           MRN: [id number] Visit Date: 04/10/2024              Requested by: Sharma Coyer, MD [address] [place],  [place] [address] PCP: Sharma Coyer, MD   Assessment & Plan: Visit Diagnoses:  1. Arthritis of knee, left   2. Acute pain of left knee     Plan: Natural history and expected course discussed. Questions answered. Recommend rest and ice. PT referral for knee VMO strengthening. Recommend acetaminophen  as needed for pain. Patellar stabilization brace. Follow up prn  Orders:  Orders Placed This Encounter  Procedures   DG Knee 3 Views Left     Subjective: Chief Complaint: Left knee pain  HPI Patient is a 49 y.o. year old female who presents with knee pain involving the  left knee. Onset of the symptoms was about a month ago. Inciting event: pain hurt getting up from a seated position. Current symptoms include pain located in the front and back of the knee. Pain is aggravated by rising after sitting.  Patient has had no prior history of knee problems.. Treatment to date: none.  Objective: Vital Signs: BP 134/87   Pulse 76   Ht 5' 4 (1.626 m)   Wt 80.8 kg   BMI 30.59 kg/m   Physical Exam Gen: Alert, No Acute Distress left knee: Skin intact, no erythema or induration noted. lateral patellar facet tenderness to palpation. Range of motion 0 to 135. No instability with varus or valgus stress. + patellar grind  Imaging: Radiographs personally reviewed by me; reveal moderate osteoarthritis of the patellofemoral compartment of the  left knee.  U/S: EXAM: LEFT LOWER EXTREMITY VENOUS DOPPLER ULTRASOUND   TECHNIQUE: Gray-scale sonography with compression, as well as color and duplex ultrasound, were performed to evaluate the deep venous system(s) from the level of the common femoral vein through the popliteal and proximal calf veins.    COMPARISON:  None Available.   FINDINGS: VENOUS   Normal compressibility of the common femoral, superficial femoral, and popliteal veins, as well as the visualized calf veins. Visualized portions of profunda femoral vein and great saphenous vein unremarkable. No filling defects to suggest DVT on grayscale or color Doppler imaging. Doppler waveforms show normal direction of venous flow, normal respiratory plasticity and response to augmentation.   Limited views of the contralateral common femoral vein are unremarkable.   OTHER   Elongated anechoic fluid collection in the popliteal fossa measures 5.3 x 1.3 x 2.1 cm. Findings are consistent with a Baker's cyst.   Limitations: none   IMPRESSION: 1. Negative for left lower extremity DVT. 2. Positive for moderately large Baker's cyst.     Electronically Signed   By: Wilkie Lent M.D.   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   Generalized anxiety disorder 09/19/2022   Insomnia, unspecified 05/23/2022   Nausea without vomiting 05/23/2022  Ventral hernia without obstruction or gangrene 05/17/2022   Diuretic-induced hypokalemia 04/18/2022   Nephrolithiasis 04/18/2022   Panic disorder 04/18/2022   Moderate major depression (HCC) 09/08/2021   Prediabetes 09/08/2021   History of nephrolithiasis 09/07/2020   Hyperlipidemia, mixed 09/07/2020   Insomnia 09/07/2020   Vitamin D  deficiency 09/07/2020   Gastroesophageal reflux disease without esophagitis 09/07/2020   Beta thalassemia trait 09/07/2020   Acute upper  respiratory infection 04/02/2020   Shortness of breath 04/02/2020   Encounter for school history and physical examination 03/02/2019   High measles virus antibody titer 03/02/2019   Irritable bowel syndrome with constipation 08/13/2018   Flank pain 06/02/2018   Renal calculi 06/02/2018   Encounter for general adult medical examination with abnormal findings 11/28/2017   Post traumatic stress disorder 11/28/2017   Urinary tract infection with hematuria 08/12/2017   Dysuria 08/12/2017   Chronic bilateral low back pain 08/12/2017   Essential hypertension 04/17/2017   H/O: hysterectomy 03/14/2011   Past Medical History:  Diagnosis Date   GERD (gastroesophageal reflux disease)    Hypertension    IBS (irritable bowel syndrome)    Kidney stone     Family History  Problem Relation Age of Onset   Cancer Mother        Pancreatic Cancer   Diabetes Mother    Hypertension Mother    Diabetes Father    Hypertension Father    Hypertension Maternal Grandmother    Diabetes Maternal Grandmother    Hypertension Maternal Grandfather    Hypertension Paternal Grandfather    Bladder Cancer Neg Hx    Kidney cancer Neg Hx     Past Surgical History:  Procedure Laterality Date   COLONOSCOPY WITH PROPOFOL  N/A 11/22/2022   Procedure: COLONOSCOPY WITH PROPOFOL ;  Surgeon: Therisa Bi, MD;  Location[facility] ENDOSCOPY;  Service: Gastroenterology;  Laterality: N/A;   CRYOABLATION  2010   ESOPHAGOGASTRODUODENOSCOPY (EGD) WITH PROPOFOL  N/A 11/22/2022   Procedure: ESOPHAGOGASTRODUODENOSCOPY (EGD) WITH PROPOFOL ;  Surgeon: Therisa Bi, MD;  Location: [facility] ENDOSCOPY;  Service: Gastroenterology;  Laterality: N/A;   POLYPECTOMY  11/22/2022   Procedure: POLYPECTOMY;  Surgeon: Therisa Bi, MD;  Location: [facility] ENDOSCOPY;  Service: Gastroenterology;;   TUBAL LIGATION  2007   VAGINAL HYSTERECTOMY  2013   Dr. Janit [facility])   Social History   Occupational History   Not on file  Tobacco Use   Smoking status: Never     Passive exposure: Never   Smokeless tobacco: Never  Vaping Use   Vaping status: Never Used  Substance and Sexual Activity   Alcohol use: No   Drug use: No   Sexual activity: Yes    Birth control/protection: Surgical   Current Outpatient Medications  Medication Instructions   hydrOXYzine  (ATARAX ) 25 mg, Oral, 3 times daily PRN   losartan -hydrochlorothiazide  (HYZAAR) 50-12.5 MG tablet 1 tablet, Oral, Daily   meloxicam  (MOBIC ) 15 mg, Oral, Daily   methocarbamol  (ROBAXIN ) 500 mg, Oral, 4 times daily   Allergies as of 04/10/2024 - Review Complete 04/03/2024  Allergen Reaction Noted   Sulfa antibiotics Anaphylaxis and Other (See Comments) 04/18/2017   "

## 2024-10-06 ENCOUNTER — Encounter: Admitting: Family Medicine
